# Patient Record
Sex: Female | Born: 1950 | Race: Black or African American | Hispanic: No | Smoking: Former smoker
Health system: Southern US, Community
[De-identification: ages and names within clinical notes are randomized; demographics above are authoritative.]

## PROBLEM LIST (undated history)

## (undated) DIAGNOSIS — N6320 Unspecified lump in the left breast, unspecified quadrant: Secondary | ICD-10-CM

## (undated) DIAGNOSIS — C801 Malignant (primary) neoplasm, unspecified: Secondary | ICD-10-CM

## (undated) DIAGNOSIS — N189 Chronic kidney disease, unspecified: Secondary | ICD-10-CM

## (undated) DIAGNOSIS — I10 Essential (primary) hypertension: Secondary | ICD-10-CM

## (undated) DIAGNOSIS — E785 Hyperlipidemia, unspecified: Secondary | ICD-10-CM

## (undated) DIAGNOSIS — M1990 Unspecified osteoarthritis, unspecified site: Secondary | ICD-10-CM

## (undated) HISTORY — DX: Essential (primary) hypertension: I10

## (undated) HISTORY — PX: BREAST EXCISIONAL BIOPSY: SUR124

## (undated) HISTORY — DX: Malignant (primary) neoplasm, unspecified: C80.1

## (undated) HISTORY — DX: Hyperlipidemia, unspecified: E78.5

---

## 1989-07-03 DIAGNOSIS — C801 Malignant (primary) neoplasm, unspecified: Secondary | ICD-10-CM

## 1989-07-03 HISTORY — PX: BREAST SURGERY: SHX581

## 1989-07-03 HISTORY — PX: BREAST LUMPECTOMY: SHX2

## 1989-07-03 HISTORY — DX: Malignant (primary) neoplasm, unspecified: C80.1

## 1998-01-26 ENCOUNTER — Other Ambulatory Visit: Admission: RE | Admit: 1998-01-26 | Discharge: 1998-01-26 | Payer: Self-pay

## 2001-10-08 ENCOUNTER — Ambulatory Visit (HOSPITAL_COMMUNITY): Admission: RE | Admit: 2001-10-08 | Discharge: 2001-10-08 | Payer: Self-pay | Admitting: Family Medicine

## 2001-10-08 ENCOUNTER — Encounter: Payer: Self-pay | Admitting: Family Medicine

## 2004-04-04 ENCOUNTER — Ambulatory Visit: Payer: Self-pay | Admitting: *Deleted

## 2004-05-11 ENCOUNTER — Ambulatory Visit: Payer: Self-pay | Admitting: Family Medicine

## 2004-05-18 ENCOUNTER — Ambulatory Visit (HOSPITAL_COMMUNITY): Admission: RE | Admit: 2004-05-18 | Discharge: 2004-05-18 | Payer: Self-pay | Admitting: Family Medicine

## 2004-05-31 ENCOUNTER — Ambulatory Visit: Payer: Self-pay | Admitting: Family Medicine

## 2004-08-02 ENCOUNTER — Ambulatory Visit: Payer: Self-pay | Admitting: Internal Medicine

## 2005-05-10 ENCOUNTER — Ambulatory Visit: Payer: Self-pay | Admitting: Family Medicine

## 2005-06-06 ENCOUNTER — Ambulatory Visit (HOSPITAL_COMMUNITY): Admission: RE | Admit: 2005-06-06 | Discharge: 2005-06-06 | Payer: Self-pay | Admitting: Family Medicine

## 2005-09-07 ENCOUNTER — Ambulatory Visit: Payer: Self-pay | Admitting: Family Medicine

## 2006-03-26 ENCOUNTER — Ambulatory Visit: Payer: Self-pay | Admitting: Family Medicine

## 2006-06-07 ENCOUNTER — Ambulatory Visit (HOSPITAL_COMMUNITY): Admission: RE | Admit: 2006-06-07 | Discharge: 2006-06-07 | Payer: Self-pay | Admitting: Family Medicine

## 2006-06-19 ENCOUNTER — Encounter (INDEPENDENT_AMBULATORY_CARE_PROVIDER_SITE_OTHER): Payer: Self-pay | Admitting: Family Medicine

## 2006-06-19 ENCOUNTER — Ambulatory Visit: Payer: Self-pay | Admitting: Family Medicine

## 2006-08-20 ENCOUNTER — Ambulatory Visit: Payer: Self-pay | Admitting: Family Medicine

## 2006-10-19 ENCOUNTER — Ambulatory Visit: Payer: Self-pay | Admitting: Family Medicine

## 2007-03-20 ENCOUNTER — Encounter (INDEPENDENT_AMBULATORY_CARE_PROVIDER_SITE_OTHER): Payer: Self-pay | Admitting: *Deleted

## 2007-07-29 ENCOUNTER — Ambulatory Visit: Payer: Self-pay | Admitting: Family Medicine

## 2007-07-30 ENCOUNTER — Ambulatory Visit (HOSPITAL_COMMUNITY): Admission: RE | Admit: 2007-07-30 | Discharge: 2007-07-30 | Payer: Self-pay | Admitting: Family Medicine

## 2007-08-20 ENCOUNTER — Ambulatory Visit: Payer: Self-pay | Admitting: Internal Medicine

## 2008-04-14 ENCOUNTER — Ambulatory Visit: Payer: Self-pay | Admitting: Family Medicine

## 2008-04-14 LAB — CONVERTED CEMR LAB
ALT: 30 units/L (ref 0–35)
AST: 22 units/L (ref 0–37)
Albumin: 4.6 g/dL (ref 3.5–5.2)
Alkaline Phosphatase: 81 units/L (ref 39–117)
BUN: 10 mg/dL (ref 6–23)
CO2: 25 meq/L (ref 19–32)
Calcium: 10.1 mg/dL (ref 8.4–10.5)
Chloride: 100 meq/L (ref 96–112)
Cholesterol: 171 mg/dL (ref 0–200)
Creatinine, Ser: 0.61 mg/dL (ref 0.40–1.20)
Glucose, Bld: 110 mg/dL — ABNORMAL HIGH (ref 70–99)
HDL: 66 mg/dL (ref >39)
LDL Cholesterol: 94 mg/dL (ref 0–99)
Microalb, Ur: 0.94 mg/dL (ref 0.00–1.89)
Potassium: 3.7 meq/L (ref 3.5–5.3)
Sodium: 141 meq/L (ref 135–145)
Total Bilirubin: 1.2 mg/dL (ref 0.3–1.2)
Total CHOL/HDL Ratio: 2.6
Total Protein: 7.9 g/dL (ref 6.0–8.3)
Triglycerides: 57 mg/dL (ref <150)
VLDL: 11 mg/dL (ref 0–40)
Vit D, 1,25-Dihydroxy: 14 — ABNORMAL LOW (ref 30–89)

## 2008-05-19 ENCOUNTER — Ambulatory Visit: Payer: Self-pay | Admitting: Internal Medicine

## 2009-02-12 ENCOUNTER — Ambulatory Visit: Payer: Self-pay | Admitting: Family Medicine

## 2009-02-12 LAB — CONVERTED CEMR LAB
AST: 20 units/L (ref 0–37)
Albumin: 4.3 g/dL (ref 3.5–5.2)
Alkaline Phosphatase: 70 units/L (ref 39–117)
Basophils Absolute: 0 10*3/uL (ref 0.0–0.1)
Basophils Relative: 0 % (ref 0–1)
Glucose, Bld: 91 mg/dL (ref 70–99)
Hemoglobin: 12.5 g/dL (ref 12.0–15.0)
Lymphocytes Relative: 55 % — ABNORMAL HIGH (ref 12–46)
MCHC: 32 g/dL (ref 30.0–36.0)
Neutro Abs: 2.1 10*3/uL (ref 1.7–7.7)
Neutrophils Relative %: 38 % — ABNORMAL LOW (ref 43–77)
Platelets: 267 10*3/uL (ref 150–400)
Potassium: 4.2 meq/L (ref 3.5–5.3)
RDW: 14.9 % (ref 11.5–15.5)
Sodium: 143 meq/L (ref 135–145)
Total Bilirubin: 1.2 mg/dL (ref 0.3–1.2)
Total Protein: 7 g/dL (ref 6.0–8.3)
Vit D, 25-Hydroxy: 30 ng/mL (ref 30–89)

## 2010-09-22 ENCOUNTER — Encounter (INDEPENDENT_AMBULATORY_CARE_PROVIDER_SITE_OTHER): Payer: Self-pay | Admitting: Family Medicine

## 2010-09-22 ENCOUNTER — Other Ambulatory Visit (HOSPITAL_COMMUNITY): Payer: Self-pay | Admitting: Family Medicine

## 2010-09-22 ENCOUNTER — Other Ambulatory Visit: Payer: Self-pay | Admitting: Family Medicine

## 2010-09-22 DIAGNOSIS — R102 Pelvic and perineal pain: Secondary | ICD-10-CM

## 2010-09-22 DIAGNOSIS — N950 Postmenopausal bleeding: Secondary | ICD-10-CM

## 2010-09-22 DIAGNOSIS — Z1231 Encounter for screening mammogram for malignant neoplasm of breast: Secondary | ICD-10-CM

## 2010-09-22 LAB — CONVERTED CEMR LAB
ALT: 27 units/L (ref 0–35)
AST: 27 units/L (ref 0–37)
Alkaline Phosphatase: 91 units/L (ref 39–117)
BUN: 11 mg/dL (ref 6–23)
Basophils Absolute: 0 10*3/uL (ref 0.0–0.1)
Basophils Relative: 0 % (ref 0–1)
Creatinine, Ser: 0.66 mg/dL (ref 0.40–1.20)
Eosinophils Absolute: 0.1 10*3/uL (ref 0.0–0.7)
HDL: 64 mg/dL (ref >39)
Hemoglobin: 14.2 g/dL (ref 12.0–15.0)
LDL Cholesterol: 85 mg/dL (ref 0–99)
MCHC: 33.1 g/dL (ref 30.0–36.0)
MCV: 87.6 fL (ref 78.0–100.0)
Microalb, Ur: 1.56 mg/dL (ref 0.00–1.89)
Monocytes Absolute: 0.4 10*3/uL (ref 0.1–1.0)
Monocytes Relative: 5 % (ref 3–12)
Neutro Abs: 3.4 10*3/uL (ref 1.7–7.7)
Neutrophils Relative %: 48 % (ref 43–77)
RBC: 4.9 M/uL (ref 3.87–5.11)
RDW: 13.7 % (ref 11.5–15.5)
TSH: 1.384 microintl units/mL (ref 0.350–4.500)
Total CHOL/HDL Ratio: 2.5
VLDL: 11 mg/dL (ref 0–40)

## 2010-09-30 ENCOUNTER — Ambulatory Visit (HOSPITAL_COMMUNITY)
Admission: RE | Admit: 2010-09-30 | Discharge: 2010-09-30 | Disposition: A | Discharge status: Home / Self Care | Payer: Self-pay | Source: Ambulatory Visit | Attending: Family Medicine | Admitting: Family Medicine

## 2010-09-30 ENCOUNTER — Ambulatory Visit (HOSPITAL_COMMUNITY): Payer: Self-pay

## 2010-09-30 DIAGNOSIS — N950 Postmenopausal bleeding: Secondary | ICD-10-CM

## 2010-09-30 DIAGNOSIS — R102 Pelvic and perineal pain: Secondary | ICD-10-CM

## 2010-09-30 DIAGNOSIS — Z1231 Encounter for screening mammogram for malignant neoplasm of breast: Secondary | ICD-10-CM | POA: Insufficient documentation

## 2011-12-18 ENCOUNTER — Encounter: Payer: Self-pay | Admitting: Family Medicine

## 2011-12-18 ENCOUNTER — Ambulatory Visit (INDEPENDENT_AMBULATORY_CARE_PROVIDER_SITE_OTHER): Payer: Self-pay | Admitting: Family Medicine

## 2011-12-18 VITALS — BP 158/104 | HR 94 | Ht 60.0 in | Wt 172.0 lb

## 2011-12-18 DIAGNOSIS — M25579 Pain in unspecified ankle and joints of unspecified foot: Secondary | ICD-10-CM

## 2011-12-18 DIAGNOSIS — E119 Type 2 diabetes mellitus without complications: Secondary | ICD-10-CM

## 2011-12-18 DIAGNOSIS — I10 Essential (primary) hypertension: Secondary | ICD-10-CM

## 2011-12-18 DIAGNOSIS — M25571 Pain in right ankle and joints of right foot: Secondary | ICD-10-CM

## 2011-12-18 MED ORDER — CLONIDINE HCL 0.1 MG PO TABS
0.2000 mg | ORAL_TABLET | Freq: Once | ORAL | Status: AC
  Administered 2011-12-18: 0.2 mg via ORAL

## 2011-12-18 MED ORDER — METFORMIN HCL 1000 MG PO TABS: ORAL_TABLET | ORAL | Status: DC

## 2011-12-18 MED ORDER — LISINOPRIL 40 MG PO TABS: 40.0000 mg | ORAL_TABLET | Freq: Every day | ORAL | Status: DC

## 2011-12-18 MED ORDER — HYDROCHLOROTHIAZIDE 25 MG PO TABS: 25.0000 mg | ORAL_TABLET | Freq: Every day | ORAL | Status: DC

## 2011-12-18 MED ORDER — METOPROLOL TARTRATE 25 MG PO TABS: 25.0000 mg | ORAL_TABLET | Freq: Two times a day (BID) | ORAL | Status: DC

## 2011-12-18 MED ORDER — METFORMIN HCL 500 MG PO TABS: ORAL_TABLET | ORAL | Status: DC

## 2011-12-18 NOTE — Patient Instructions (Signed)
It was nice to meet you.  I want to make sure we get your blood pressure and your diabetes under control.  Please make a Nurse appointment for Thursday or Friday to check your blood sugar.    When you get the [facility], please call the office to make an appointment with me (hopefully 3-4 weeks) so we can get blood work.

## 2011-12-19 DIAGNOSIS — I10 Essential (primary) hypertension: Secondary | ICD-10-CM | POA: Insufficient documentation

## 2011-12-19 DIAGNOSIS — E119 Type 2 diabetes mellitus without complications: Secondary | ICD-10-CM | POA: Insufficient documentation

## 2011-12-19 DIAGNOSIS — M25572 Pain in left ankle and joints of left foot: Secondary | ICD-10-CM | POA: Insufficient documentation

## 2011-12-19 NOTE — Progress Notes (Signed)
  Subjective:    Patient ID: Ashley Sloan, female    DOB: 1950/10/14, 61 y.o.   MRN: [id number]  HPI  Ashley Sloan presents to establish care.    HTN- Was taking HCTZ, Amlodpine, and Lisinopril, but ran out a few months ago. She says she has had HTN for around 30 years.  She denies any current dyspnea, LE swelling, chest pain or palpitations.  She denies vision changes, but does admit to some headaches since she ran out of her medications.   DM- patient says she was taking Actos, and her diabetes was well controlled, but says she has not been checking her blood sugars since she ran out of medications.  She denies polyuria/polydipsia.   Right Foot/Ankle pain- has bothered her for many months, but she thinks it has been worse for about three weeks.  It used to hurt like a knife stabbing the medial side of her ankle when she was walking, but now she also has a dull achy pain all the time. No injury.    Family History  Problem Relation Age of Onset  . Diabetes Mother   . Hypertension Mother   . COPD Mother   . Hypertension Father   . Heart disease Sister   . Hypertension Brother   . Diabetes Daughter    Past Medical History  Diagnosis Date  . Hypertension   . Diabetes mellitus   . Hyperlipidemia   . 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 Smoking status: Former Smoker -- 0.5 packs/day for 25 years    Quit date: 12/18/1982  . Smokeless tobacco: Not on file  . Alcohol Use: No   Review of Systems Pertinent items in HPI.     Objective:   Physical Exam Initial BP: 201/133 BP 158/104  Pulse 94  Ht 5' (1.524 m)  Wt 172 lb (78.019 kg)  BMI 33.59 kg/m2 General appearance: alert, cooperative and no distress Neck: no adenopathy, no carotid bruit, no JVD, supple, symmetrical, trachea midline and thyroid not enlarged, symmetric, no tenderness/mass/nodules Lungs: clear to auscultation bilaterally Heart: regular rate and rhythm, S1, S2 normal, no murmur, click, rub or  gallop Abdomen: soft, non-tender; bowel sounds normal; no masses,  no organomegaly Extremities: extremities normal, atraumatic, no cyanosis or edema  Right Ankle: No visible erythema or swelling. Range of motion is full in all directions. Strength is 5/5 in all directions.  Patient has pain with inversion resistance.  Tenderness to palpation behind medial malleolus.  Stable lateral and medial ligaments; squeeze test and kleiger test unremarkable; No pain at base of 5th MT; No tenderness over cuboid; No tenderness on posterior aspects of lateral and medial malleolus No sign of peroneal tendon subluxations; Able to walk 4 steps.        Assessment & Plan:

## 2011-12-19 NOTE — Assessment & Plan Note (Signed)
Suspect tendonitis- advised ice, ROM exercises, topical aspercreme, and NSAIDS if needed.  Will re-address if it does not improve.

## 2011-12-19 NOTE — Assessment & Plan Note (Signed)
Patient off medications, initial BP 201/133.  Patient given .2mg  of Clonidine, with improvement in BP.  Discussed finances, patient is planning to get [facility] card, but would appreciate all her medications be on $4 list.  Will re-start HCTZ, Lisinopril, and d/c Amlodpine (generic but not $4), and add Metoprolol.  Discussed importance of taking these medications every day, starting today.  She voices understanding.

## 2011-12-19 NOTE — Assessment & Plan Note (Signed)
Off medications, but unsure about control currently.  No overt signs of hyperglycemia.  Patient previously on Actos, which is not on the $4 plan, will Start metformin at 500 po bid, increase to 1000 bid.  I have asked her to come back in 3-4 weeks when she has the [facility] card so we can get labs.

## 2011-12-22 ENCOUNTER — Ambulatory Visit (INDEPENDENT_AMBULATORY_CARE_PROVIDER_SITE_OTHER): Payer: Self-pay | Admitting: *Deleted

## 2011-12-22 VITALS — BP 180/100

## 2011-12-22 DIAGNOSIS — E119 Type 2 diabetes mellitus without complications: Secondary | ICD-10-CM

## 2011-12-22 DIAGNOSIS — I10 Essential (primary) hypertension: Secondary | ICD-10-CM

## 2011-12-22 MED ORDER — VERAPAMIL HCL 80 MG PO TABS: 80.0000 mg | ORAL_TABLET | Freq: Three times a day (TID) | ORAL | Status: DC

## 2011-12-22 NOTE — Progress Notes (Signed)
Patient in for BP check and BP check. FBS 14 hours PP 107.  She is taking metformin as directed.  Started with one whole tablet today.  BP  RA 180/100 and LA 190/100 pulse 56.  Medications reviewed and she is taking as directed.   Paged Dr. Lula Olszewski and she advises that she will send in a new medication for patient to take in addition to her current meds.  Patient has follow up on 07/08.

## 2012-01-08 ENCOUNTER — Ambulatory Visit (INDEPENDENT_AMBULATORY_CARE_PROVIDER_SITE_OTHER): Payer: Self-pay | Admitting: Family Medicine

## 2012-01-08 ENCOUNTER — Encounter: Payer: Self-pay | Admitting: Family Medicine

## 2012-01-08 VITALS — BP 172/90 | HR 76 | Temp 98.5°F | Ht 60.0 in | Wt 173.2 lb

## 2012-01-08 DIAGNOSIS — M25579 Pain in unspecified ankle and joints of unspecified foot: Secondary | ICD-10-CM

## 2012-01-08 DIAGNOSIS — E119 Type 2 diabetes mellitus without complications: Secondary | ICD-10-CM

## 2012-01-08 DIAGNOSIS — I10 Essential (primary) hypertension: Secondary | ICD-10-CM

## 2012-01-08 DIAGNOSIS — M25572 Pain in left ankle and joints of left foot: Secondary | ICD-10-CM

## 2012-01-08 LAB — POCT GLYCOSYLATED HEMOGLOBIN (HGB A1C): Hemoglobin A1C: 7

## 2012-01-08 MED ORDER — METOPROLOL TARTRATE 50 MG PO TABS: 50.0000 mg | ORAL_TABLET | Freq: Two times a day (BID) | ORAL | Status: DC

## 2012-01-08 MED ORDER — VERAPAMIL HCL 80 MG PO TABS: 80.0000 mg | ORAL_TABLET | Freq: Three times a day (TID) | ORAL | Status: DC

## 2012-01-08 MED ORDER — METOPROLOL TARTRATE 50 MG PO TABS: 25.0000 mg | ORAL_TABLET | Freq: Two times a day (BID) | ORAL | Status: DC

## 2012-01-08 NOTE — Assessment & Plan Note (Signed)
Exam still consistent with tendonitis.  Advised ASO lace up ankle brace for work, ice after work, continue topical Aspercreme, tylenol ibuprofen PRN.

## 2012-01-08 NOTE — Assessment & Plan Note (Signed)
Patient taking metformin 1000 mg po daily- A1C = 7.0.  Will continue current dose.

## 2012-01-08 NOTE — Progress Notes (Signed)
  Subjective:    Patient ID: Ashley Sloan, female    DOB: Mar 28, 1951, 61 y.o.   MRN: [id number]  HPI  Ms. Haffey comes in for follow up.  BP- she is taking HCTZ, Lisinopril, Metoprolol as prescribed.  She has not gotten Verapamil yet- says the pharmacy says they did not have it.  She denies any chest pain, dyspnea, LE swelling, headaches.   DM- taking metfomrin 1000 mg po daily.  Not checking sugars at home.  Some GI upset with metformin, but it is tolerable.  She denies hyper or hypoglycemic episodes that she knows of.   Foot pain- left foot and ankle continue to hurt.  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  She iced it some and took some ibuprofen, which helped a little.  She denies any injury, pain is sharp shooting pain on medial side of left ankle.  She says sometimes it swells.   Review of Systems Pertinent items in HPI.     Objective:   Physical Exam BP 172/90  Pulse 76  Temp 98.5 F (36.9 C) (Oral)  Ht 5' (1.524 m)  Wt 173 lb 3.2 oz (78.563 kg)  BMI 33.83 kg/m2 General appearance: alert, cooperative and no distress Eyes: conjunctivae/corneas clear. PERRL, EOM's intact. Fundi benign. Neck: no adenopathy, no JVD, supple, symmetrical, trachea midline and thyroid not enlarged, symmetric, no tenderness/mass/nodules Lungs: clear to auscultation bilaterally Heart: regular rate and rhythm, S1, S2 normal, no murmur, click, rub or gallop Left Ankle: +TTP posterior to medial malleolus, pain in that area with dorsiflexion and plantar flexion, but no weakness.  No swelling or deformity.        Assessment & Plan:

## 2012-01-08 NOTE — Patient Instructions (Signed)
It was good to see you.  Your blood pressure today was BP: 172/90 mmHg.  Remember your goal blood pressure is about 120/80.  Please be sure to take your medication every day.   - Continue to take Hydrochlorothiazide and Lisinopril every day.  - I have increased your Metoprolol from 25 mg twice a day to 50 mg twice a day.  I sent a new prescription to [facility], but you can take two pills in the morning and two pills at night until you run out so you don't waste any.  - I have also called in Verapamil, I want you to take one pill three times a day.    Please make a nurse visit in one week to have your blood pressure checked again.   Keep taking your metformin once a day for diabetes.    For your ankle, please wear the ankle brace at work, ice your ankle after work, and use tylenol and ibuprofen as needed for pain.

## 2012-01-08 NOTE — Assessment & Plan Note (Signed)
Still elevated.  Continue HCTZ and Lisinopril.  Increase metoprolol to 50 po bid, and add verapamil 80 TID.  Patient instructed to come in for nurse visit in one week for BP check, f/u with me in 3 months or PRN.

## 2012-12-12 ENCOUNTER — Ambulatory Visit (INDEPENDENT_AMBULATORY_CARE_PROVIDER_SITE_OTHER): Payer: Self-pay | Admitting: Family Medicine

## 2012-12-12 ENCOUNTER — Encounter: Payer: Self-pay | Admitting: Family Medicine

## 2012-12-12 VITALS — BP 184/93 | HR 72 | Temp 98.1°F | Ht 60.0 in | Wt 171.0 lb

## 2012-12-12 DIAGNOSIS — E119 Type 2 diabetes mellitus without complications: Secondary | ICD-10-CM

## 2012-12-12 DIAGNOSIS — M25519 Pain in unspecified shoulder: Secondary | ICD-10-CM

## 2012-12-12 DIAGNOSIS — M25511 Pain in right shoulder: Secondary | ICD-10-CM | POA: Insufficient documentation

## 2012-12-12 DIAGNOSIS — I10 Essential (primary) hypertension: Secondary | ICD-10-CM

## 2012-12-12 LAB — LIPID PANEL
Cholesterol: 203 mg/dL — ABNORMAL HIGH (ref 0–200)
Total CHOL/HDL Ratio: 3.4 Ratio
Triglycerides: 70 mg/dL (ref <150)
VLDL: 14 mg/dL (ref 0–40)

## 2012-12-12 LAB — CBC
Hemoglobin: 13 g/dL (ref 12.0–15.0)
MCHC: 33.6 g/dL (ref 30.0–36.0)
RDW: 14.2 % (ref 11.5–15.5)

## 2012-12-12 LAB — BASIC METABOLIC PANEL
Glucose, Bld: 116 mg/dL — ABNORMAL HIGH (ref 70–99)
Potassium: 4 mEq/L (ref 3.5–5.3)
Sodium: 141 mEq/L (ref 135–145)

## 2012-12-12 LAB — POCT GLYCOSYLATED HEMOGLOBIN (HGB A1C): Hemoglobin A1C: 6.8

## 2012-12-12 MED ORDER — METOPROLOL TARTRATE 50 MG PO TABS: 50.0000 mg | ORAL_TABLET | Freq: Two times a day (BID) | ORAL | Status: DC

## 2012-12-12 MED ORDER — METFORMIN HCL 1000 MG PO TABS: ORAL_TABLET | ORAL | Status: DC

## 2012-12-12 MED ORDER — LISINOPRIL-HYDROCHLOROTHIAZIDE 20-12.5 MG PO TABS: 2.0000 | ORAL_TABLET | Freq: Every day | ORAL | Status: DC

## 2012-12-12 MED ORDER — VERAPAMIL HCL 80 MG PO TABS: 80.0000 mg | ORAL_TABLET | Freq: Three times a day (TID) | ORAL | Status: DC

## 2012-12-12 NOTE — Assessment & Plan Note (Signed)
Pain could be secondary to rotator cuff injury or tendonitis. Will give shoulder exercises to work on ROM. Continue ibuprofen daily for inflammation. If does not improve, will consider referral to [facility] for ultrasound.

## 2012-12-12 NOTE — Assessment & Plan Note (Signed)
BP above goal. On Lisinopril 40mg , HCTZ 25 mg and Metoprolol 50mg  BID. Will add Verapamil 80mg  TID to regimen which is on $4 list. Also, combined Lisinopril and HCTZ into one pill to reduce cost. F/u in 1 month, or sooner if she is unable to tolerate new medication.

## 2012-12-12 NOTE — Patient Instructions (Addendum)
It was nice to meet you.  I have refilled your medications. I have added back Verapamil 80mg  TID. It is on the $4 list at [facility].  Please come back in one month to check on your blood pressure.  Janese Radabaugh M. Kiondre Grenz, M.D.  Shoulder Range of Motion Exercises The shoulder is the most flexible joint in the human body. Because of this it is also the most unstable joint in the body. All ages can develop shoulder problems. Early treatment of problems is necessary for a good outcome. People react to shoulder pain by decreasing the movement of the joint. After a brief period of time, the shoulder can become "frozen". This is an almost complete loss of the ability to move the damaged shoulder. Following injuries your caregivers can give you instructions on exercises to keep your range of motion (ability to move your shoulder freely), or regain it if it has been lost.  EXERCISES EXERCISES TO MAINTAIN THE MOBILITY OF YOUR SHOULDER: Codman's Exercise or Pendulum Exercise  This exercise may be performed in a prone (face-down) lying position or standing while leaning on a chair with the opposite arm. Its purpose is to relax the muscles in your shoulder and slowly but surely increase the range of motion and to relieve pain.  Lie on your stomach close to the side edge of the bed. Let your weak arm hang over the edge of the bed. Relax your shoulder, arm and hand. Let your shoulder blade relax and drop down.  Slowly and gently swing your arm forward and back. Do not use your neck muscles; relax them. It might be easier to have someone else gently start swinging your arm.  As pain decreases, increase your swing. To start, arm swing should begin at 15 degree angles. In time and as pain lessens, move to 30-45 degree angles. Start with swinging for about 15 seconds, and work towards swinging for 3 to 5 minutes.  This exercise may also be performed in a standing/bent over position.  Stand and hold onto a sturdy chair  with your good arm. Bend forward at the waist and bend your knees slightly to help protect your back. Relax your weak arm, let it hang limp. Relax your shoulder blade and let it drop.  Keep your shoulder relaxed and use body motion to swing your arm in small circles.  Stand up tall and relax.  Repeat motion and change direction of circles.  Start with swinging for about 30 seconds, and work towards swinging for 3 to 5 minutes. STRETCHING EXERCISES:  Lift your arm out in front of you with the elbow bent at 90 degrees. Using your other arm gently pull the elbow forward and across your body.  Bend one arm behind you with the palm facing outward. Using the other arm, hold a towel or rope and reach this arm up above your head, then bend it at the elbow to move your wrist to behind your neck. Grab the free end of the towel with the hand behind your back. Gently pull the towel up with the hand behind your neck, gradually increasing the pull on the hand behind the small of your back. Then, gradually pull down with the hand behind the small of your back. This will pull the hand and arm behind your neck further. Both shoulders will have an increased range of motion with repetition of this exercise. STRENGTHENING EXERCISES:  Standing with your arm at your side and straight out from your shoulder with  the elbow bent at 90 degrees, hold onto a small weight and slowly raise your hand so it points straight up in the air. Repeat this five times to begin with, and gradually increase to ten times. Do this four times per day. As you grow stronger you can gradually increase the weight.  Repeat the above exercise, only this time using an elastic band. Start with your hand up in the air and pull down until your hand is by your side. As you grow stronger, gradually increase the amount you pull by increasing the number or size of the elastic bands. Use the same amount of repetitions.  Standing with your hand at your  side and holding onto a weight, gradually lift the hand in front of you until it is over your head. Do the same also with the hand remaining at your side and lift the hand away from your body until it is again over your head. Repeat this five times to begin with, and gradually increase to ten times. Do this four times per day. As you grow stronger you can gradually increase the weight. Document Released: 03/18/2003 Document Revised: 09/11/2011 Document Reviewed: 06/19/2005 [facility] Patient Information 2014 [place], [place].

## 2012-12-12 NOTE — Assessment & Plan Note (Signed)
Metformin refilled. Foot exam performed today. Will check A1C, CBC and Bmet today. Retinal scan today. F/u 3 months.

## 2012-12-12 NOTE — Progress Notes (Signed)
Patient ID: NARE GASPARI, female   DOB: April 12, 1951, 63 y.o.   MRN: [id number]  [facility] Amber M. Hairford, MD Phone: [phone]   Subjective: HPI: Patient is a 62 y.o. female presenting to clinic today for follow up appointment. Concerns today include HTN and DM. Patient would like to talk about right shoulder pain  1. Hypertension Blood pressure at home: Checks with wrist cuff, normally high Blood pressure today: 184/93 Taking Meds: Lisinopril, HCTZ, Metoprolol. Not missed any doses, but needs refills Side effects: None ROS: Denies headache, visual changes, nausea, vomiting, chest pain, abdominal pain or shortness of breath.  2. Diabetes:  Does not monitor at home. No symptomatic lows. Taking medications: Metformin Side effects: None ROS: denies chills, dizziness, LOC, polyuria, polydipsia, numbness or tingling in extremities or chest pain. Last eye exam: More than 5 years, will do retinal scan in clinic today Last foot exam: 3 years ago Nephropathy screen indicated?: On Lisinopril Last flu, zoster and/or pneumovax: 2013, no shingles vaccine, pneumovax within last 5 years at [facility] but patient does not remember whenshe got it.  3. Shoulder pain Reports pain in right shoulder. Worse if she is reaching back like wiping herself on the toilet. Does not hurt to reach up or forward. Taking ibuprofen which helps some. Was told in the past that she had bursitis. No injury to shoulder. No swelling, redness of joint or fevers.  History Reviewed: Former smoker. Health Maintenance: Needs mammogram, but does not have insurance  ROS: Please see HPI above.  Objective: Office vital signs reviewed. BP 184/93  Pulse 72  Temp(Src) 98.1 F (36.7 C) (Oral)  Ht 5' (1.524 m)  Wt 171 lb (77.565 kg)  BMI 33.4 kg/m2  Physical Examination:  General: Awake, alert. NAD. Very pleasant HEENT: Atraumatic, normocephalic. MMM Neck: No masses palpated. No LAD Pulm:  CTAB, no wheezes Cardio: RRR, no murmurs appreciated Abdomen:+BS, soft, nontender, nondistended Extremities: Trace lower extremity edema.   Right shoulder: Good ROM reaching forward and upward. Decreased reach backwards. Some signs of impingement on hawkins. No TTP of joint. No obvious edema. Neuro: Grossly intact  Assessment: 62 y.o. female follow up appointment  Plan: See Problem List and After Visit Summary

## 2012-12-17 ENCOUNTER — Encounter: Payer: Self-pay | Admitting: Family Medicine

## 2012-12-17 ENCOUNTER — Telehealth: Payer: Self-pay | Admitting: Family Medicine

## 2012-12-17 MED ORDER — SIMVASTATIN 40 MG PO TABS: 40.0000 mg | ORAL_TABLET | Freq: Every day | ORAL | Status: DC

## 2012-12-17 NOTE — Telephone Encounter (Signed)
Called patient again and found out she will not be home from work until 7pm.  Left message for her to call her doctor's office.  Kinnick Maus, Darlyne Russian, CMA

## 2012-12-17 NOTE — Telephone Encounter (Signed)
LM for patient to return call to her doctors office.  Ewen Varnell, Darlyne Russian, CMA

## 2012-12-17 NOTE — Telephone Encounter (Signed)
Please let patient know that she will need to start a cholesterol medication.   Her bad cholesterol was 130 on her last labs, and we would like for it to be less than 100. Given her diabetes and high blood pressure, this puts her at a higher risk for heart disease so I would like to lower her cholesterol some.  We will start with a [facility] $4 medication, Simvastatin, that I will call in for her. We will recheck her lipids in 6 months to see if it has improved. I will also send her a copy of her lab report for her records.  Thank you! Ashley Sloan, M.D.

## 2012-12-19 ENCOUNTER — Telehealth: Payer: Self-pay | Admitting: Family Medicine

## 2012-12-19 NOTE — Telephone Encounter (Signed)
Reached patient on phone, someone else had already spoken to patient and given her message, see MD note below.  Ashley Sloan, Darlyne Russian, CMA

## 2012-12-19 NOTE — Telephone Encounter (Signed)
Will fwd to Md.  Atasha Colebank L, CMA  

## 2012-12-19 NOTE — Telephone Encounter (Signed)
Patient was told the simvastatin was on the $4 list at [facility] but it was actually $53. Patient would like to switch to a medicine that is on the $4 list. Pls call patient once script has been sent to [facility]

## 2012-12-20 NOTE — Telephone Encounter (Signed)
Pt would like prescription sent to [facility],  [facility] into that pharmacy.  Sonam Huelsmann, Darlyne Russian, CMA

## 2012-12-20 NOTE — Telephone Encounter (Signed)
Please notify:  $5 at [facility] $10 at [facility] and [employer]

## 2013-01-07 ENCOUNTER — Telehealth: Payer: Self-pay | Admitting: *Deleted

## 2013-01-07 NOTE — Telephone Encounter (Signed)
Patient calling due to decreased BP & appetite since BP med was increased last month.  BP readings at work---99/70 & 100/?Marland Kitchen  Took BP meds this morning and "was not feeling right."  Patient had to leave work.  Just checked BP before calling & 121/75.  Last office note states patient needs to return in 1 month for BP check.  Will check with MD & see if appt needs to be with MD or only nurse visit.  Gaylene Brooks, RN

## 2013-01-17 ENCOUNTER — Ambulatory Visit (INDEPENDENT_AMBULATORY_CARE_PROVIDER_SITE_OTHER): Payer: Self-pay | Admitting: *Deleted

## 2013-01-17 VITALS — BP 178/86 | HR 66 | Resp 20

## 2013-01-17 DIAGNOSIS — I10 Essential (primary) hypertension: Secondary | ICD-10-CM

## 2013-01-17 MED ORDER — VERAPAMIL HCL 80 MG PO TABS: 40.0000 mg | ORAL_TABLET | Freq: Three times a day (TID) | ORAL | Status: DC

## 2013-01-17 NOTE — Progress Notes (Signed)
Patient ID: Ashley Sloan, female   DOB: 1950-10-01, 62 y.o.   MRN: [id number]  Please have her cut her Verapamil in half, so that she takes 40mg  TID. Then please have follow-up with you in two weeks to reassess BP and side-effects.

## 2013-01-17 NOTE — Progress Notes (Signed)
Pt states that verapamil is making her nauseated and that she has not been taking it and once while taking her BP was under 100 systolic. Encouraged drinking lots of water, low salt diet, avoiding pork and sodas. Pt verbalized understanding. INformed pt that I would call with any further instructions regarding meds. Wyatt Haste, RN-BSN

## 2013-08-18 ENCOUNTER — Other Ambulatory Visit: Payer: Self-pay | Admitting: Family Medicine

## 2013-08-19 NOTE — Telephone Encounter (Signed)
Please have her schedule an appointment in the the next 3 months; I've refilled her BP and DM meds until then.

## 2013-08-20 NOTE — Telephone Encounter (Signed)
Pt is aware of refills and that she will need to schedule an appt. Tou Hayner,CMA

## 2013-10-30 ENCOUNTER — Ambulatory Visit (INDEPENDENT_AMBULATORY_CARE_PROVIDER_SITE_OTHER): Payer: Medicare Other | Admitting: Family Medicine

## 2013-10-30 ENCOUNTER — Encounter: Payer: Self-pay | Admitting: Family Medicine

## 2013-10-30 VITALS — BP 158/90 | HR 91 | Temp 98.1°F | Ht 60.0 in | Wt 178.0 lb

## 2013-10-30 DIAGNOSIS — I10 Essential (primary) hypertension: Secondary | ICD-10-CM

## 2013-10-30 DIAGNOSIS — M21969 Unspecified acquired deformity of unspecified lower leg: Secondary | ICD-10-CM | POA: Insufficient documentation

## 2013-10-30 DIAGNOSIS — E119 Type 2 diabetes mellitus without complications: Secondary | ICD-10-CM

## 2013-10-30 DIAGNOSIS — M25579 Pain in unspecified ankle and joints of unspecified foot: Secondary | ICD-10-CM

## 2013-10-30 LAB — POCT GLYCOSYLATED HEMOGLOBIN (HGB A1C): HEMOGLOBIN A1C: 7.1

## 2013-10-30 LAB — CBC
HEMATOCRIT: 36.7 % (ref 36.0–46.0)
Hemoglobin: 12.6 g/dL (ref 12.0–15.0)
MCH: 29 pg (ref 26.0–34.0)
MCHC: 34.3 g/dL (ref 30.0–36.0)
MCV: 84.6 fL (ref 78.0–100.0)
PLATELETS: 254 10*3/uL (ref 150–400)
RBC: 4.34 MIL/uL (ref 3.87–5.11)
RDW: 14.1 % (ref 11.5–15.5)
WBC: 7.6 10*3/uL (ref 4.0–10.5)

## 2013-10-30 MED ORDER — MELOXICAM 7.5 MG PO TABS: 7.5000 mg | ORAL_TABLET | Freq: Every day | ORAL | Status: DC

## 2013-10-30 NOTE — Assessment & Plan Note (Signed)
BP slightly elevated initially; on recheck 150/90 - Discussed cutting back on salt (which she adds to foods) - Will have her return for nurse BP check in 1 month - She previously discontinued Verapamil herself after hypotensive episodes at work and dizziness

## 2013-10-30 NOTE — Progress Notes (Signed)
  Patient name: Ashley Sloan MRN [id number]  Date of birth: March 18, 1951  CC & HPI:  Ashley Sloan is a 63 y.o. female presenting today for HTN, DM and Foot pain.   Foot Pain  Left > Right achy pain on top of feet gradually getting worse. No trauma, No numbness or pins and needles sensation. Gets worse as the day goes on, and she on her feet. Taking occassional ibuprofen without relief.   CHRONIC HYPERTENSION  BP Readings from Last 3 Encounters:  10/30/13 158/90  01/17/13 178/86  12/12/12 184/93    Control: BP on recheck 150/90 Disease Monitoring  Blood pressure range outside clinc: not checking  Chest pain: no   Dyspnea: no   Claudication: no  Medication compliance/financial difficulties: no, Stopped taking Verapamil due to hypotensive episodes and dizziness   Medication Side Effects: None since stopping Verapamil  Preventitive Healthcare:   History  Smoking status  . Former Smoker -- 0.50 packs/day for 25 years  . Quit date: 12/18/1982  Smokeless tobacco  . Not on file   Salt Restriction < 1500 mg daily: Not following currently; discussed cutting back on salt (which she adds to foods)   DIABETES   Blood Sugar Ranges: Not current checking  Symptoms of Hypoglycemia? no  Comorbid Symptoms: no Chest pain; no SOB; no Neuropathy: no Vision problems  Medication Compliance: yes   Medication Side Effects: None  ROS: See HPI above otherwise negative.  Medical & Surgical Hx:  Reviewed.  Medications & Allergies: Reviewed & Updated - see associated section Social History: Reviewed:   Objective Findings:  Vitals: BP 158/90  Pulse 91  Temp(Src) 98.1 F (36.7 C) (Oral)  Ht 5' (1.524 m)  Wt 178 lb (80.74 kg)  BMI 34.76 kg/m2  Gen: NAD CV: RRR w/o m/r/g, pulses +2 b/l Resp: CTAB w/ normal respiratory effort Feet: Point tenderness on mid plantar surface of her feet L>R. No swelling or erythema. Pulses 2+; FROM sensation intact.   Assessment & Plan:   Please See  Problem Focused Assessment & Plan

## 2013-10-30 NOTE — Patient Instructions (Signed)
It was great seeing you today.   1. Your diabetes is well controlled today! 2. Your blood pressure is a little high, and this is most likely due to the salt in your diet. Try to limit the salt in your diet for the next month, and then return to clinic for a blood pressure check.  3. Your Foot pain is caused by your shoes being too tight. Loosen your shoes and take Mobic every morning for the next month. You should also ice your foot after work for the next week.    Please bring all your medications to every doctors visit  Sign up for My Chart to have easy access to your labs results, and communication with your Primary care physician.  Next Appointment  Please call to make an appointment for nurse blood pressure check in 1 month.   I look forward to talking with you again at our next visit. If you have any questions or concerns before then, please call the clinic at [phone].  Take Care,   Dr Phill Myron

## 2013-10-30 NOTE — Assessment & Plan Note (Signed)
Point tenderness on plantar surface L>R; Likely due to lacing shoes too tight, secondary exacerbated by mild foot swelling with prolonged standing/working. - patient education - Ice  - Mobic

## 2013-10-30 NOTE — Assessment & Plan Note (Signed)
Well controlled - continue current treatment - Not currently checking blood sugars; No hypoglycemic symptoms

## 2013-10-31 LAB — COMPREHENSIVE METABOLIC PANEL
ALBUMIN: 4.2 g/dL (ref 3.5–5.2)
ALT: 26 U/L (ref 0–35)
AST: 26 U/L (ref 0–37)
Alkaline Phosphatase: 67 U/L (ref 39–117)
BUN: 11 mg/dL (ref 6–23)
CALCIUM: 10.1 mg/dL (ref 8.4–10.5)
CHLORIDE: 98 meq/L (ref 96–112)
CO2: 27 meq/L (ref 19–32)
Creat: 0.76 mg/dL (ref 0.50–1.10)
Glucose, Bld: 157 mg/dL — ABNORMAL HIGH (ref 70–99)
POTASSIUM: 3.5 meq/L (ref 3.5–5.3)
SODIUM: 139 meq/L (ref 135–145)
TOTAL PROTEIN: 6.8 g/dL (ref 6.0–8.3)
Total Bilirubin: 0.8 mg/dL (ref 0.2–1.2)

## 2013-11-03 ENCOUNTER — Encounter: Payer: Self-pay | Admitting: Family Medicine

## 2014-01-26 ENCOUNTER — Other Ambulatory Visit: Payer: Self-pay | Admitting: Family Medicine

## 2014-03-03 ENCOUNTER — Ambulatory Visit (INDEPENDENT_AMBULATORY_CARE_PROVIDER_SITE_OTHER): Payer: Medicare Other | Admitting: Family Medicine

## 2014-03-03 ENCOUNTER — Telehealth: Payer: Self-pay | Admitting: Home Health Services

## 2014-03-03 ENCOUNTER — Encounter: Payer: Self-pay | Admitting: Family Medicine

## 2014-03-03 VITALS — BP 180/100 | HR 80 | Ht 60.0 in | Wt 176.0 lb

## 2014-03-03 DIAGNOSIS — Z23 Encounter for immunization: Secondary | ICD-10-CM

## 2014-03-03 DIAGNOSIS — M25579 Pain in unspecified ankle and joints of unspecified foot: Secondary | ICD-10-CM

## 2014-03-03 DIAGNOSIS — E785 Hyperlipidemia, unspecified: Secondary | ICD-10-CM

## 2014-03-03 DIAGNOSIS — M25572 Pain in left ankle and joints of left foot: Secondary | ICD-10-CM

## 2014-03-03 DIAGNOSIS — M79609 Pain in unspecified limb: Secondary | ICD-10-CM

## 2014-03-03 DIAGNOSIS — M79672 Pain in left foot: Secondary | ICD-10-CM

## 2014-03-03 DIAGNOSIS — I10 Essential (primary) hypertension: Secondary | ICD-10-CM

## 2014-03-03 DIAGNOSIS — E119 Type 2 diabetes mellitus without complications: Secondary | ICD-10-CM

## 2014-03-03 LAB — POCT GLYCOSYLATED HEMOGLOBIN (HGB A1C): HEMOGLOBIN A1C: 7.2

## 2014-03-03 LAB — HM DIABETES EYE EXAM

## 2014-03-03 MED ORDER — ATORVASTATIN CALCIUM 40 MG PO TABS
40.0000 mg | ORAL_TABLET | Freq: Every day | ORAL | Status: DC
Start: 2014-03-03 — End: 2015-03-06

## 2014-03-03 MED ORDER — LISINOPRIL-HYDROCHLOROTHIAZIDE 20-12.5 MG PO TABS
2.0000 | ORAL_TABLET | Freq: Every day | ORAL | Status: DC
Start: 2014-03-03 — End: 2014-10-07

## 2014-03-03 MED ORDER — METOPROLOL TARTRATE 50 MG PO TABS: 50.0000 mg | ORAL_TABLET | Freq: Two times a day (BID) | ORAL | Status: DC

## 2014-03-03 MED ORDER — METFORMIN HCL 1000 MG PO TABS: 1000.0000 mg | ORAL_TABLET | Freq: Two times a day (BID) | ORAL | Status: DC

## 2014-03-03 NOTE — Assessment & Plan Note (Signed)
Pes planus with TMT bossing - Referral to sports med clinic - would likely benefit from custom orthotics

## 2014-03-03 NOTE — Assessment & Plan Note (Signed)
Good control; A1c 7.2 - Increase metformin: 1000 mg twice a day - Eye today in clinic

## 2014-03-03 NOTE — Telephone Encounter (Signed)
Error

## 2014-03-03 NOTE — Patient Instructions (Signed)
It was great seeing you today.   1. I've referred you to sports medicine to get your foot pain evaluated and likely have shoe inserts made 2. I've increased Metformin to 1000 mg twice a day 3. Refilled your BP medications 4. Start taking Lipitor 40 mg every night   Please bring all your medications to every doctors visit  Sign up for My Chart to have easy access to your labs results, and communication with your Primary care physician.  Next Appointment  Please make an appointment with Dr Berkley Harvey in 1   I look forward to talking with you again at our next visit. If you have any questions or concerns before then, please call the clinic at [phone].  Take Care,   Dr Phill Myron

## 2014-03-03 NOTE — Progress Notes (Signed)
  Patient name: Ashley Sloan MRN [id number]  Date of birth: [birth year]  CC & HPI:  Ashley Sloan is a 63 y.o. female presenting today for DM, HTN and foot pain.   DIABETES   Symptoms of Hypoglycemia? no  Comorbid Symptoms: Denies Chest pain;  SOB;  Neuropathy:  Vision problems  Medication Compliance: yes   Medication Side Effects: Denies n/v/d  CHRONIC HYPERTENSION  BP Readings from Last 3 Encounters:  03/03/14 180/100  10/30/13 158/90  01/17/13 178/86    Disease Monitoring  Chest pain: no   Dyspnea: no   Claudication: no  Medication compliance: no, reports being out of Lisinopril/HCTZ for 1 month  Medication Side Effects: Denies Dizziness/lightheadedness;    Preventitive Healthcare:   History  Smoking status  . Former Smoker -- 0.50 packs/day for 25 years  . Quit date: 12/18/1982  Smokeless tobacco  . Not on file   Foot Pain - she endorses left dorsal midfoot pain that comes and goes for the past several months. She does not notice anything that makes the pain worse.  She has tried ibuprofen which helps some; tried [facility] without any relief. She denies any numbness or tingling.  Denies any swelling or weakness. Denies any recent or previous trauma.   ROS: See HPI   Medical & Surgical Hx:  Reviewed  Medications & Allergies: Reviewed  Social History: Reviewed:   Objective Findings:  Vitals: BP 180/100  Pulse 80  Ht 5' (1.524 m)  Wt 176 lb (79.833 kg)  BMI 34.37 kg/m2  Gen: NAD CV: RRR w/o m/r/g, pulses +2 b/l Resp: CTAB w/ normal respiratory effort Left Foot: Inspection: TMT bossing noted w/o  visible swelling, ecchymosis, erythema. Pes Planus Palpation:   Tenderness over midfoot  ROM: Full in plantarflexion, dorsiflexion, Strength: 5/5 in all directions. Sensation: intact Vascular: intact w/ dorsalis pedis & posterior tibialis pulses 2+  Assessment & Plan:   Please See Problem Focused Assessment & Plan

## 2014-03-03 NOTE — Assessment & Plan Note (Signed)
Poor control today, likely due to medication compliance x1 month - Refill lisinopril/HCTZ Followup in one month for reassessment

## 2014-03-03 NOTE — Assessment & Plan Note (Signed)
ASCVD risk ~ 18% - Previous on Zocor, but not taking - Start Lipitor 40mg  qhs; follow-up in 1 month to assess side-effects

## 2014-03-04 LAB — LDL CHOLESTEROL, DIRECT: LDL DIRECT: 138 mg/dL — AB

## 2014-03-06 ENCOUNTER — Encounter: Payer: Self-pay | Admitting: Family Medicine

## 2014-03-12 ENCOUNTER — Encounter: Payer: Self-pay | Admitting: Family Medicine

## 2014-03-13 ENCOUNTER — Ambulatory Visit
Admission: RE | Admit: 2014-03-13 | Discharge: 2014-03-13 | Disposition: A | Discharge status: Home / Self Care | Payer: Medicare Other | Source: Ambulatory Visit | Attending: Family Medicine | Admitting: Family Medicine

## 2014-03-13 ENCOUNTER — Ambulatory Visit (INDEPENDENT_AMBULATORY_CARE_PROVIDER_SITE_OTHER): Payer: Medicare Other | Admitting: Family Medicine

## 2014-03-13 ENCOUNTER — Encounter: Payer: Self-pay | Admitting: Family Medicine

## 2014-03-13 VITALS — BP 155/74 | HR 62 | Ht 60.0 in | Wt 176.0 lb

## 2014-03-13 DIAGNOSIS — M1380 Other specified arthritis, unspecified site: Secondary | ICD-10-CM

## 2014-03-13 DIAGNOSIS — E11618 Type 2 diabetes mellitus with other diabetic arthropathy: Secondary | ICD-10-CM

## 2014-03-13 DIAGNOSIS — E1169 Type 2 diabetes mellitus with other specified complication: Secondary | ICD-10-CM

## 2014-03-13 DIAGNOSIS — M25572 Pain in left ankle and joints of left foot: Secondary | ICD-10-CM

## 2014-03-13 DIAGNOSIS — M25579 Pain in unspecified ankle and joints of unspecified foot: Secondary | ICD-10-CM

## 2014-03-13 DIAGNOSIS — M21962 Unspecified acquired deformity of left lower leg: Secondary | ICD-10-CM

## 2014-03-13 DIAGNOSIS — M21969 Unspecified acquired deformity of unspecified lower leg: Secondary | ICD-10-CM

## 2014-03-13 NOTE — Progress Notes (Signed)
  Ashley Sloan - 63 y.o. female MRN [id number]  Date of birth: 04-17-1951  SUBJECTIVE:  Including CC & ROS.  The patient is referred by her PCP for evaluation of: Left foot pain: 6+ months of intermittent left sharp shooting foot pain over the dorsum of her foot. Worse with weightbearing and ambulation. Denies any numbness, tingling or electrical sensations. She's had no prior injury or trauma this foot. She does have a noticeable lump on the top of her foot  HISTORY: Past Medical, Surgical, Social, and Family History Reviewed & Updated per EMR. Pertinent Historical Findings include: Hypertension, diabetes (20 year history without significant neurologic or cardiovascular complications), hyperlipidemia, history of breast cancer 1991 - Breast lumpectomy, C-section,  - 0.5ppd smoker for 25 years quit 1984  OBJECTIVE FINDINGS:  VS:  HT:5' (152.4 cm)   WT:176 lb (79.833 kg)  BMI:34.4          BP:155/74 mmHg  HR:62bpm  TEMP: ( )  RESP:   PHYSICAL EXAM:            GENERAL:  Elderly African American female. In no discomfort; no respiratory distress                PSYCH:  alert and appropriate, good insight  Left foot Exam:   APPEAR/PALP:  Left foot with dorsal bossing of the mid foot. No midfoot erythma. No other significant deformity.  No bunion/bunionette. No significant plantar callus.                     ROM:  Ankle plantar/dorsi flexion, normal talar tilt. Restricted mid foot motion.        STRENGTH:  Intrinsic ankle Strength 5+/5.  Appropriate PT recruitment with toe raising                  NV:  Slightly diminished sensation over dorsum of left foot.  DP and PT 2+/4 bilaterally.              Tests:  Negative straight leg raise,  Small amount of erythema at bilateral great toe consistent with repetitive trauma from too small of shoe.  Toe at the end of her shoe with weight bearing  ASSESSMENT: 1. Pain in joint, ankle and foot, left   2. Type 2 diabetes mellitus with other diabetic  arthropathy    Midfoot bossing consistent with degenerative changes but concern for Charcot changes given 20 year history of diabetes. Her shoes are proportionally too small for her and she would likely do better with custom/diabetic/cushioned shoes.  PLAN: See problem based charting & AVS for additional documentation. - Plain film x-ray left foot evaluate baseline degenerative changes - Referral to biotech provided by Dr. Nori Riis for custom diabetic shoes. Encouraged to wear when weightbearing. > Return for to PCP chronic disease management.

## 2014-03-16 DIAGNOSIS — M21969 Unspecified acquired deformity of unspecified lower leg: Secondary | ICD-10-CM | POA: Insufficient documentation

## 2014-03-16 NOTE — Assessment & Plan Note (Signed)
Protective DM shoes necessary and Rx

## 2014-03-16 NOTE — Progress Notes (Signed)
Patient ID: RANEISHA BRESS, female   DOB: 04/10/1951, 63 y.o.   MRN: [id number] [facility]: Attending Note: I have reviewed the chart, discussed wit the Sports Medicine Fellow. I agree with assessment and treatment plan as detailed in the [facility] note. Foot deformity needs imaging. Early Charcot? Needs DM shoes and we have given her Rx and instructions on how to obtain.

## 2014-03-18 ENCOUNTER — Encounter: Payer: Self-pay | Admitting: Family Medicine

## 2014-04-14 ENCOUNTER — Encounter (HOSPITAL_COMMUNITY): Payer: Self-pay | Admitting: Emergency Medicine

## 2014-04-14 ENCOUNTER — Emergency Department (HOSPITAL_COMMUNITY)
Admission: EM | Admit: 2014-04-14 | Discharge: 2014-04-14 | Disposition: A | Discharge status: Home / Self Care | Payer: Medicare Other | Attending: Emergency Medicine | Admitting: Emergency Medicine

## 2014-04-14 DIAGNOSIS — M10071 Idiopathic gout, right ankle and foot: Secondary | ICD-10-CM | POA: Diagnosis not present

## 2014-04-14 DIAGNOSIS — Z87891 Personal history of nicotine dependence: Secondary | ICD-10-CM | POA: Diagnosis not present

## 2014-04-14 DIAGNOSIS — Z7982 Long term (current) use of aspirin: Secondary | ICD-10-CM | POA: Insufficient documentation

## 2014-04-14 DIAGNOSIS — E119 Type 2 diabetes mellitus without complications: Secondary | ICD-10-CM | POA: Insufficient documentation

## 2014-04-14 DIAGNOSIS — M79604 Pain in right leg: Secondary | ICD-10-CM | POA: Diagnosis present

## 2014-04-14 DIAGNOSIS — E785 Hyperlipidemia, unspecified: Secondary | ICD-10-CM | POA: Insufficient documentation

## 2014-04-14 DIAGNOSIS — I10 Essential (primary) hypertension: Secondary | ICD-10-CM | POA: Diagnosis not present

## 2014-04-14 DIAGNOSIS — M109 Gout, unspecified: Secondary | ICD-10-CM

## 2014-04-14 DIAGNOSIS — Z853 Personal history of malignant neoplasm of breast: Secondary | ICD-10-CM | POA: Diagnosis not present

## 2014-04-14 DIAGNOSIS — Z79899 Other long term (current) drug therapy: Secondary | ICD-10-CM | POA: Diagnosis not present

## 2014-04-14 MED ORDER — HYDROCODONE-ACETAMINOPHEN 5-325 MG PO TABS: 1.0000 | ORAL_TABLET | ORAL | Status: DC | PRN

## 2014-04-14 MED ORDER — NAPROXEN 500 MG PO TABS: 500.0000 mg | ORAL_TABLET | Freq: Two times a day (BID) | ORAL | Status: DC

## 2014-04-14 NOTE — ED Notes (Signed)
Blister (not open) on rt. Foot. Been hurting for a few days.

## 2014-04-14 NOTE — ED Provider Notes (Signed)
CSN: [id number]     Arrival date & time 04/14/14  7628 History  This chart was scribed for non-physician practitioner, Dewaine Oats, PA-C, working with Hoy Morn, MD by Ladene Artist, ED Scribe. This patient was seen in room TR07C/TR07C and the patient's care was started at 9:46 AM.   Chief Complaint  Patient presents with  . Foot Pain  . Blister   The history is provided by the patient. No language interpreter was used.   HPI Comments: Ashley Sloan is a 63 y.o. female, with a h/o DM, who presents to the Emergency Department complaining gradually worsening R foot and R great toe pain onset 4-5 days ago. She currently rates her pain 9/10 and describes the pain as a constant throbbing sensation yesterday, intermittent today. Pt reports associated redness and warmth first noted yesterday. She denies discharge or open wounds. She also denies fever, chills, SOB, chest pain, leg pain, numbness/tingling, new leg swelling, HA, visual disturbances at this time. Pt reports "occassional neuropathy". Pt currently ambulates with a limp due to severity of pain. She has not contacted her DM physician for symptoms. Pt reports that her DM is well controlled with her last A1C reading of 7.2 last month. Pt states that she checks the sensation in her feet daily. Pt has elevated her legs with temporary relief. No h/o gout. No medications PTA.   Past Medical History  Diagnosis Date  . Hypertension   . Diabetes mellitus   . Hyperlipidemia   . 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 Cesarean section  1968  . Breast surgery  1991    Lumpectomy   Family History  Problem Relation Age of Onset  . Diabetes Mother   . Hypertension Mother   . COPD Mother   . Hypertension Father   . Heart disease Sister   . Hypertension Brother   . Diabetes Daughter    History  Substance Use Topics  . Smoking status: Former Smoker -- 0.50 packs/day for 25 years    Quit date:  12/18/1982  . Smokeless tobacco: Not on file  . Alcohol Use: No   OB History   Grav Para Term Preterm Abortions TAB SAB Ect Mult Living                 Review of Systems  Constitutional: Negative for fever and chills.  Eyes: Negative for visual disturbance.  Respiratory: Negative for shortness of breath.   Cardiovascular: Negative for chest pain.  Musculoskeletal:       See HPI.  Skin: Positive for color change. Negative for wound.  Neurological: Negative for numbness and headaches.   Allergies  Review of patient's allergies indicates no known allergies.  Home Medications   Prior to Admission medications   Medication Sig Start Date End Date Taking? Authorizing Provider  aspirin 81 MG tablet Take 81 mg by mouth daily.    Historical Provider, MD  atorvastatin (LIPITOR) 40 MG tablet Take 1 tablet (40 mg total) by mouth daily at 6 PM. 03/03/14   Olam Idler, MD  lisinopril-hydrochlorothiazide (PRINZIDE,ZESTORETIC) 20-12.5 MG per tablet Take 2 tablets by mouth daily. 03/03/14   Olam Idler, MD  metFORMIN (GLUCOPHAGE) 1000 MG tablet Take 1 tablet (1,000 mg total) by mouth 2 (two) times daily with a meal. 03/03/14   Olam Idler, MD  metoprolol (LOPRESSOR) 50 MG tablet Take 1 tablet (50 mg total) by mouth 2 (  two) times daily. 03/03/14 05/23/15  Olam Idler, MD   Triage Vitals: BP 197/73  Pulse 65  Temp(Src) 97.8 F (36.6 C) (Oral)  Resp 20  Ht 5' (1.524 m)  Wt 172 lb (78.019 kg)  BMI 33.59 kg/m2  SpO2 98% Physical Exam  Nursing note and vitals reviewed. Constitutional: She is oriented to person, place, and time. She appears well-developed and well-nourished.  HENT:  Head: Normocephalic and atraumatic.  Neck: Neck supple.  Pulmonary/Chest: Effort normal.  Musculoskeletal: Normal range of motion.  Right foot tender over 1st MTP joint with associated erythema. No lesion, blister or rash. Redness extends to dorsal forefoot. Pain with movement of great toe. No calf tenderness.    Neurological: She is alert and oriented to person, place, and time.  Skin: Skin is warm and dry. There is erythema.  Psychiatric: She has a normal mood and affect. Her behavior is normal.   ED Course  Procedures (including critical care time) DIAGNOSTIC STUDIES: Oxygen Saturation is 98% on RA, normal by my interpretation.    COORDINATION OF CARE: 9:56 AM-Discussed treatment plan with pt at bedside and pt agreed to plan.   Labs Review Labs Reviewed - No data to display  Imaging Review No results found.   EKG Interpretation None      MDM   Final diagnoses:  None    1. 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 Venora Maples has seen and evaluated patient.  No fever, pain associated with 1st MTP joint in a diabetic - suspect gout more likely than cellulitis. Start on Indomethacin, provide pain relief. Follow up appointment with PCP-Family Practice arranged for 04/16/14 at 2:00pm for recheck. Skin marked at erythematous borders.   I personally performed the services described in this documentation, which was scribed in my presence. The recorded information has been reviewed and is accurate.    Dewaine Oats, PA-C 04/14/14 [phone]

## 2014-04-14 NOTE — Discharge Instructions (Signed)

## 2014-04-15 NOTE — ED Provider Notes (Signed)
Medical screening examination/treatment/procedure(s) were conducted as a shared visit with non-physician practitioner(s) and myself.  I personally evaluated the patient during the encounter.   EKG Interpretation None      Suspect developing gout. Doubt infection at this time. Have asked pt to follow up in ER in 48 hours for recheck. She understands to return to the ER for new or worsening symtoms  Hoy Morn, MD 04/15/14 1945

## 2014-04-16 ENCOUNTER — Encounter: Payer: Self-pay | Admitting: Family Medicine

## 2014-04-16 ENCOUNTER — Ambulatory Visit (INDEPENDENT_AMBULATORY_CARE_PROVIDER_SITE_OTHER): Payer: Medicare Other | Admitting: Family Medicine

## 2014-04-16 VITALS — BP 189/97 | HR 67 | Temp 98.1°F

## 2014-04-16 DIAGNOSIS — M109 Gout, unspecified: Secondary | ICD-10-CM | POA: Insufficient documentation

## 2014-04-16 DIAGNOSIS — I10 Essential (primary) hypertension: Secondary | ICD-10-CM

## 2014-04-16 DIAGNOSIS — M10071 Idiopathic gout, right ankle and foot: Secondary | ICD-10-CM

## 2014-04-16 MED ORDER — NAPROXEN 500 MG PO TABS: 500.0000 mg | ORAL_TABLET | Freq: Two times a day (BID) | ORAL | Status: DC

## 2014-04-16 NOTE — Patient Instructions (Signed)
Dear Ashley Sloan, Thank you for coming in to clinic today.  Today we discussed your Foot Pain. 1. It looks like you had an episode of Gout - this can be caused by a number of factors. Glad it is getting better. Important to keep taking Naprosyn 500mg  twice daily as needed for pain, this should get better within 1-2 weeks. (sent a refill for you to have more on hand if needed. If you develop future similar episode you can start taking Naprosyn immediately) 2. Also, your blood pressure is elevated today - I recommend seeing your regular doctor soon to re-check BP and discuss medications. The Hydrochlorothiazide (HCTZ) can increase risk of gout, so you probably need to have that medicine changed. Continue taking it for now. 3. To reduce future gout episodes - you should reduce the amount of red meat that you eat, see list below for other preventative strategies.  Some important numbers from today's visit: BP - 189/97 >>> re-checked at 180 / 70  Please schedule a follow-up appointment with Dr. Berkley Harvey in 1-2 weeks for follow-up and BP re-check.  If you have any other questions or concerns, please feel free to call the clinic to contact me. You may also schedule an earlier appointment if necessary.  However, if your symptoms get significantly worse, please go to the Emergency Department to seek immediate medical attention.  Nobie Putnam, DO [facility]   Gout Gout is an inflammatory arthritis caused by a buildup of uric acid crystals in the joints. Uric acid is a chemical that is normally present in the blood. When the level of uric acid in the blood is too high it can form crystals that deposit in your joints and tissues. This causes joint redness, soreness, and swelling (inflammation). Repeat attacks are common. Over time, uric acid crystals can form into masses (tophi) near a joint, destroying bone and causing disfigurement. Gout is treatable and often preventable. CAUSES   The disease begins with elevated levels of uric acid in the blood. Uric acid is produced by your body when it breaks down a naturally found substance called purines. Certain foods you eat, such as meats and fish, contain high amounts of purines. Causes of an elevated uric acid level include:  Being passed down from parent to child (heredity).  Diseases that cause increased uric acid production (such as obesity, psoriasis, and certain cancers).  Excessive alcohol use.  Diet, especially diets rich in meat and seafood.  Medicines, including certain cancer-fighting medicines (chemotherapy), water pills (diuretics), and aspirin.  Chronic kidney disease. The kidneys are no longer able to remove uric acid well.  Problems with metabolism. Conditions strongly associated with gout include:  Obesity.  High blood pressure.  High cholesterol.  Diabetes. Not everyone with elevated uric acid levels gets gout. It is not understood why some people get gout and others do not. Surgery, joint injury, and eating too much of certain foods are some of the factors that can lead to gout attacks. SYMPTOMS   An attack of gout comes on quickly. It causes intense pain with redness, swelling, and warmth in a joint.  Fever can occur.  Often, only one joint is involved. Certain joints are more commonly involved:  Base of the big toe.  Knee.  Ankle.  Wrist.  Finger. Without treatment, an attack usually goes away in a few days to weeks. Between attacks, you usually will not have symptoms, which is different from many other forms of arthritis. DIAGNOSIS  Your caregiver will suspect gout based on your symptoms and exam. In some cases, tests may be recommended. The tests may include:  Blood tests.  Urine tests.  X-rays.  Joint fluid exam. This exam requires a needle to remove fluid from the joint (arthrocentesis). Using a microscope, gout is confirmed when uric acid crystals are seen in the joint  fluid. TREATMENT  There are two phases to gout treatment: treating the sudden onset (acute) attack and preventing attacks (prophylaxis).  Treatment of an Acute Attack.  Medicines are used. These include anti-inflammatory medicines or steroid medicines.  An injection of steroid medicine into the affected joint is sometimes necessary.  The painful joint is rested. Movement can worsen the arthritis.  You may use warm or cold treatments on painful joints, depending which works best for you.  Treatment to Prevent Attacks.  If you suffer from frequent gout attacks, your caregiver may advise preventive medicine. These medicines are started after the acute attack subsides. These medicines either help your kidneys eliminate uric acid from your body or decrease your uric acid production. You may need to stay on these medicines for a very long time.  The early phase of treatment with preventive medicine can be associated with an increase in acute gout attacks. For this reason, during the first few months of treatment, your caregiver may also advise you to take medicines usually used for acute gout treatment. Be sure you understand your caregiver's directions. Your caregiver may make several adjustments to your medicine dose before these medicines are effective.  Discuss dietary treatment with your caregiver or dietitian. Alcohol and drinks high in sugar and fructose and foods such as meat, poultry, and seafood can increase uric acid levels. Your caregiver or dietitian can advise you on drinks and foods that should be limited. HOME CARE INSTRUCTIONS   Do not take aspirin to relieve pain. This raises uric acid levels.  Only take over-the-counter or prescription medicines for pain, discomfort, or fever as directed by your caregiver.  Rest the joint as much as possible. When in bed, keep sheets and blankets off painful areas.  Keep the affected joint raised (elevated).  Apply warm or cold treatments  to painful joints. Use of warm or cold treatments depends on which works best for you.  Use crutches if the painful joint is in your leg.  Drink enough fluids to keep your urine clear or pale yellow. This helps your body get rid of uric acid. Limit alcohol, sugary drinks, and fructose drinks.  Follow your dietary instructions. Pay careful attention to the amount of protein you eat. Your daily diet should emphasize fruits, vegetables, whole grains, and fat-free or low-fat milk products. Discuss the use of coffee, vitamin C, and cherries with your caregiver or dietitian. These may be helpful in lowering uric acid levels.  Maintain a healthy body weight. SEEK MEDICAL CARE IF:   You develop diarrhea, vomiting, or any side effects from medicines.  You do not feel better in 24 hours, or you are getting worse. SEEK IMMEDIATE MEDICAL CARE IF:   Your joint becomes suddenly more tender, and you have chills or a fever. MAKE SURE YOU:   Understand these instructions.  Will watch your condition.  Will get help right away if you are not doing well or get worse. Document Released: 06/16/2000 Document Revised: 11/03/2013 Document Reviewed: 01/31/2012 [facility] Patient Information 2015 [place], [place]. This information is not intended to replace advice given to you by your health care provider. Make  sure you discuss any questions you have with your health care provider.

## 2014-04-16 NOTE — Progress Notes (Signed)
   Subjective:    Patient ID: Ashley Sloan, female    DOB: August 11, 1950, 63 y.o.   MRN: [id number]  Patient presents for a same day appointment.  HPI  RIGHT FOOT PAIN / GOUT: - Reports symptoms of gradual pain for about 1-3 weeks, denies any accidents, injuries, falls. Stated that she went to ED on Tuesday 04/14/14 and was told she had "Gout" in her right foot, denies any prior history of gout before. Described acute worsening of pain over past 3 days with sudden onset while resting "severe pain" to any touch, even with "putting a sock on", described mid-foot near big toe as "red and hot", worse with ambulation - Tried Ibuprofen 200mg  (up to 4-6 tabs daily) with some but no significant relief - At ED prescribed Naprosyn 500mg  BID with significant improvement - Denies any recent med changes - Diet: admits frequent red meat, no alcohol - Denies fevers/chills, no other joint pain, redness or rash, nausea, vomiting, abdominal pain  CHRONIC HTN: Reports - no concerns (chart review with difficult to control BP) Current Meds - Lisinopril-HCTZ 20-12.5mg  (2 tabs daily), Metoprolol 50mg  BID Reports good compliance, took meds today. Tolerating well, w/o complaints. Denies CP, dyspnea, HA, edema, dizziness / lightheadedness  I have reviewed and updated the following as appropriate: allergies and current medications  Social Hx:  - Former smoker - Denies drinking alcohol  Review of Systems  See above HPI    Objective:   Physical Exam  BP 189/97  Pulse 67  Temp(Src) 98.1 F (36.7 C) (Oral)  Re-check BP: 180/70  Gen - well-appearing, very pleasant, NAD HEENT - NCAT, PERRL, EOMI, oropharynx clear, MMM Neck - supple, non-tender Heart - RRR, no murmurs heard Ext - Right Foot: normal appearing, symmetrical to Left, without edema, no significant tenderness, very mild residual discomfort regular palpation of R-great toe and medial-midfoot, no erythema, full active ROM, ankle non-tender no  edema.Peripheral pulses intact +2 b/l Skin - warm, dry, no rashes Neuro - alert, grossly non-focal, intact muscle strength 5/5 b/l ankles, intact distal sensation to light touch, gait normal     Assessment & Plan:   See specific A&P problem list for details.

## 2014-04-17 NOTE — Assessment & Plan Note (Addendum)
History consistent with acute Gouty flare of Right foot (great toe / medial-midfoot), 1st episode no prior gout hx, currently mostly resolved after treatment with Naprosyn x 48 hours from ED (04/14/14) - No signs of infection, other joint involvement, tolerating ambulation  Plan: 1. Continue Naprosyn 500mg  BID x 1 week total or until symptoms resolved, re-ordered Naprosyn rx for patient to have for future episode 2. Advised reduce dietary red meat, sweetened drinks 3. Additionally continues on HCTZ 25mg  daily, discussed inc risk of future gout flare on this medication, and anticipate to be discontinued in future if BP able to be controlled 4. Consider Uric acid blood work if future flares, may try colchicine if not responding to Naprosyn. Monitor frequency of flares before considering preventative medication

## 2014-04-17 NOTE — Assessment & Plan Note (Signed)
Remains poor controlled HTN, improved but elevated on re-check  No complications   Plan:  1. Continue current BP meds - Lisinopril-HCTZ 20-12.5mg  (x 2, daily) and Metoprolol 2. Recommend adding 4th agent for BP control such as Amlodipine (CCB seems appropriate choice for this patient, and prior history of tolerating). However, given acute gout flare (1st episode) recommend switching patient off of HCTZ as well, but may not have optimal control unless meds titrated. Also Losartan with some uricosuric effects may be appropriate transition from ACE to ARB 3. Discussed options with patient, agreed to defer significant BP regimen changes at this time (some concern for occasional missed doses) 4. Plan to RTC 1-2 weeks for BP re-check and address BP med regimen at that time

## 2014-06-29 ENCOUNTER — Ambulatory Visit: Payer: Medicare Other | Admitting: Internal Medicine

## 2014-10-07 ENCOUNTER — Other Ambulatory Visit: Payer: Self-pay | Admitting: Family Medicine

## 2014-11-29 ENCOUNTER — Emergency Department (HOSPITAL_COMMUNITY)
Admission: EM | Admit: 2014-11-29 | Discharge: 2014-11-29 | Disposition: A | Discharge status: Home / Self Care | Payer: PPO | Attending: Emergency Medicine | Admitting: Emergency Medicine

## 2014-11-29 ENCOUNTER — Encounter (HOSPITAL_COMMUNITY): Payer: Self-pay | Admitting: Emergency Medicine

## 2014-11-29 DIAGNOSIS — R05 Cough: Secondary | ICD-10-CM | POA: Diagnosis present

## 2014-11-29 DIAGNOSIS — Z853 Personal history of malignant neoplasm of breast: Secondary | ICD-10-CM | POA: Insufficient documentation

## 2014-11-29 DIAGNOSIS — E119 Type 2 diabetes mellitus without complications: Secondary | ICD-10-CM | POA: Insufficient documentation

## 2014-11-29 DIAGNOSIS — Z7982 Long term (current) use of aspirin: Secondary | ICD-10-CM | POA: Diagnosis not present

## 2014-11-29 DIAGNOSIS — M109 Gout, unspecified: Secondary | ICD-10-CM | POA: Diagnosis not present

## 2014-11-29 DIAGNOSIS — J209 Acute bronchitis, unspecified: Secondary | ICD-10-CM | POA: Diagnosis not present

## 2014-11-29 DIAGNOSIS — Z79899 Other long term (current) drug therapy: Secondary | ICD-10-CM | POA: Insufficient documentation

## 2014-11-29 DIAGNOSIS — I10 Essential (primary) hypertension: Secondary | ICD-10-CM | POA: Diagnosis not present

## 2014-11-29 DIAGNOSIS — H109 Unspecified conjunctivitis: Secondary | ICD-10-CM | POA: Diagnosis not present

## 2014-11-29 DIAGNOSIS — E785 Hyperlipidemia, unspecified: Secondary | ICD-10-CM | POA: Diagnosis not present

## 2014-11-29 DIAGNOSIS — Z87891 Personal history of nicotine dependence: Secondary | ICD-10-CM | POA: Insufficient documentation

## 2014-11-29 MED ORDER — METOPROLOL TARTRATE 25 MG PO TABS
50.0000 mg | ORAL_TABLET | Freq: Once | ORAL | Status: AC
  Administered 2014-11-29: 50 mg via ORAL
  Filled 2014-11-29: qty 2

## 2014-11-29 MED ORDER — LISINOPRIL 20 MG PO TABS
20.0000 mg | ORAL_TABLET | Freq: Once | ORAL | Status: AC
  Administered 2014-11-29: 20 mg via ORAL
  Filled 2014-11-29: qty 1

## 2014-11-29 MED ORDER — PREDNISONE 20 MG PO TABS: ORAL_TABLET | ORAL | Status: DC

## 2014-11-29 MED ORDER — HYDROCODONE-ACETAMINOPHEN 5-325 MG PO TABS: 1.0000 | ORAL_TABLET | ORAL | Status: DC | PRN

## 2014-11-29 MED ORDER — BENZONATATE 100 MG PO CAPS: 100.0000 mg | ORAL_CAPSULE | Freq: Three times a day (TID) | ORAL | Status: DC

## 2014-11-29 MED ORDER — SULFACETAMIDE SODIUM 10 % OP SOLN: 1.0000 [drp] | OPHTHALMIC | Status: DC

## 2014-11-29 NOTE — ED Provider Notes (Signed)
CSN: [id number]     Arrival date & time 11/29/14  0810 History   First MD Initiated Contact with Patient 11/29/14 0818     Chief Complaint  Patient presents with  . Conjunctivitis  . Cough  . Gout     (Consider location/radiation/quality/duration/timing/severity/associated sxs/prior Treatment) HPI First symptom was cough starting approximately 2 weeks ago. It has been dry with no associated chest pain or shortness of breath. Last week the patient believes she had fever associated however did not measure her temperature. At that time she also reports myalgias. Those symptoms have resolved and the patient persists with a dry cough that is worse at night. She developed a red inflamed eye on the left yesterday. No associated visual change, pain or itching. She does state there is been clear drainage and tearing. 2 days ago the patient developed pain at her right, first metatarsal joint consistent with prior gout flareups. She states now there is a mild amount of redness and the base of the toe is tender. In the past with a flare she reports she has been a lot more redness and swelling. No other joints are involved at this time. Of note the patient's blood pressure is significantly elevated, she reports that she has not taken her morning medications, she does not endorse any signs of endorgan damage. Past Medical History  Diagnosis Date  . Hypertension   . Diabetes mellitus   . Hyperlipidemia   . 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 Cesarean section  1968  . Breast surgery  1991    Lumpectomy   Family History  Problem Relation Age of Onset  . Diabetes Mother   . Hypertension Mother   . COPD Mother   . Hypertension Father   . Heart disease Sister   . Hypertension Brother   . Diabetes Daughter    History  Substance Use Topics  . Smoking status: Former Smoker -- 0.50 packs/day for 25 years    Quit date: 12/18/1982  . Smokeless tobacco: Not on  file  . Alcohol Use: No   OB History    No data available     Review of Systems 10 Systems reviewed and are negative for acute change except as noted in the HPI.    Allergies  Review of patient's allergies indicates no known allergies.  Home Medications   Prior to Admission medications   Medication Sig Start Date End Date Taking? Authorizing Provider  acetaminophen (TYLENOL) 500 MG tablet Take 1,000 mg by mouth every 6 (six) hours as needed for mild pain.   Yes Historical Provider, MD  aspirin 81 MG tablet Take 81 mg by mouth daily.   Yes Historical Provider, MD  atorvastatin (LIPITOR) 40 MG tablet Take 1 tablet (40 mg total) by mouth daily at 6 PM. 03/03/14  Yes Olam Idler, MD  ibuprofen (ADVIL,MOTRIN) 200 MG tablet Take 200 mg by mouth every 4 (four) hours as needed for mild pain.   Yes Historical Provider, MD  lisinopril-hydrochlorothiazide (PRINZIDE,ZESTORETIC) 20-12.5 MG per tablet take 2 tablets by mouth once daily 10/08/14  Yes Olam Idler, MD  metFORMIN (GLUCOPHAGE) 1000 MG tablet Take 1 tablet (1,000 mg total) by mouth 2 (two) times daily with a meal. 03/03/14  Yes Olam Idler, MD  metoprolol (LOPRESSOR) 50 MG tablet Take 1 tablet (50 mg total) by mouth 2 (two) times daily. 03/03/14 05/23/15 Yes Olam Idler, MD  Phenyleph-Doxylamine-DM-APAP (  ALKA SELTZER PLUS PO) Take 1 tablet by mouth 2 (two) times daily as needed (cold).   Yes Historical Provider, MD  Pseudoephedrine-APAP-DM (DAYQUIL PO) Take 1 capsule by mouth 2 (two) times daily as needed (cough).   Yes Historical Provider, MD  benzonatate (TESSALON) 100 MG capsule Take 1 capsule (100 mg total) by mouth every 8 (eight) hours. 11/29/14   Charlesetta Shanks, MD  HYDROcodone-acetaminophen (NORCO/VICODIN) 5-325 MG per tablet Take 1-2 tablets by mouth every 4 (four) hours as needed for moderate pain or severe pain. 11/29/14   Charlesetta Shanks, MD  predniSONE (DELTASONE) 20 MG tablet 3 tabs po day one, then 2 po daily x 4 days  11/29/14   Charlesetta Shanks, MD  sulfacetamide (BLEPH-10) 10 % ophthalmic solution Place 1-2 drops into the left eye every 4 (four) hours. 11/29/14   Charlesetta Shanks, MD   BP 178/76 mmHg  Pulse 72  Temp(Src) 97.7 F (36.5 C) (Oral)  Resp 18  Ht 5' (1.524 m)  Wt 173 lb (78.472 kg)  BMI 33.79 kg/m2  SpO2 100% Physical Exam  Constitutional: She is oriented to person, place, and time. She appears well-developed and well-nourished.  HENT:  Head: Normocephalic and atraumatic.  Right Ear: External ear normal.  Left Ear: External ear normal.  Nose: Nose normal.  Mouth/Throat: Oropharynx is clear and moist.  Eyes: EOM are normal. Pupils are equal, round, and reactive to light. Left eye exhibits no discharge.  Left eye has diffuse conjunctival injection. The pupil is symmetric and light responsive without photophobia. No periorbital edema.  Neck: Neck supple.  Cardiovascular: Normal rate, regular rhythm, normal heart sounds and intact distal pulses.   Pulmonary/Chest: Effort normal and breath sounds normal.  Patient has moderately frequent dry cough. No respiratory distress and normal pulmonary examination.  Abdominal: Soft. Bowel sounds are normal. She exhibits no distension. There is no tenderness.  Musculoskeletal: Normal range of motion. She exhibits tenderness. She exhibits no edema.  Mild to moderate tenderness at the first metatarsal joint on the right. Mild erythema is perceivable. No large inflammation or swelling at this time. Patient's calves are soft and nontender with no peripheral edema.  Neurological: She is alert and oriented to person, place, and time. She has normal strength. Coordination normal. GCS eye subscore is 4. GCS verbal subscore is 5. GCS motor subscore is 6.  Skin: Skin is warm, dry and intact.  Psychiatric: She has a normal mood and affect.    ED Course  Procedures (including critical care time) Labs Review Labs Reviewed - No data to display  Imaging Review No  results found.   EKG Interpretation None      MDM   Final diagnoses:  Acute gout of right foot, unspecified cause  Conjunctivitis of left eye  Acute bronchitis, unspecified organism   2 weeks of cough without sputum production, fever, dyspnea or chest pain is consistent with a viral bronchitis pattern at this time. Patient will be given Tessalon Perles. Metatarsal pain consistent with gout flare. Due to patient's comorbidity with hypertension I will this point avoid NSAID and opt to treat with a short course of prednisone and Vicodin for pain control. Patient has painless injection of the left eye without any visual complaints, at this time she'll be treated with sulfacetamide for conjunctivitis. Patient was chronic essential hypertension with no end organ damage, will be given normal a.m. medications.    Charlesetta Shanks, MD 11/29/14 [id number]

## 2014-11-29 NOTE — ED Notes (Signed)
Pt reports woke up yesterday with redness to left eye. Pt tried visine without relief. Pt reports  Non productive cough and ache in her back started 2 weeks ago. Pt reports gout in right toe onset Friday, pt has history of same. Pt has been eating red meats.

## 2014-11-29 NOTE — Discharge Instructions (Signed)
Acute Bronchitis Bronchitis is inflammation of the airways that extend from the windpipe into the lungs (bronchi). The inflammation often causes mucus to develop. This leads to a cough, which is the most common symptom of bronchitis.  In acute bronchitis, the condition usually develops suddenly and goes away over time, usually in a couple weeks. Smoking, allergies, and asthma can make bronchitis worse. Repeated episodes of bronchitis may cause further lung problems.  CAUSES Acute bronchitis is most often caused by the same virus that causes a cold. The virus can spread from person to person (contagious) through coughing, sneezing, and touching contaminated objects. SIGNS AND SYMPTOMS   Cough.   Fever.   Coughing up mucus.   Body aches.   Chest congestion.   Chills.   Shortness of breath.   Sore throat.  DIAGNOSIS  Acute bronchitis is usually diagnosed through a physical exam. Your health care provider will also ask you questions about your medical history. Tests, such as chest X-rays, are sometimes done to rule out other conditions.  TREATMENT  Acute bronchitis usually goes away in a couple weeks. Oftentimes, no medical treatment is necessary. Medicines are sometimes given for relief of fever or cough. Antibiotic medicines are usually not needed but may be prescribed in certain situations. In some cases, an inhaler may be recommended to help reduce shortness of breath and control the cough. A cool mist vaporizer may also be used to help thin bronchial secretions and make it easier to clear the chest.  HOME CARE INSTRUCTIONS  Get plenty of rest.   Drink enough fluids to keep your urine clear or pale yellow (unless you have a medical condition that requires fluid restriction). Increasing fluids may help thin your respiratory secretions (sputum) and reduce chest congestion, and it will prevent dehydration.   Take medicines only as directed by your health care provider.  If  you were prescribed an antibiotic medicine, finish it all even if you start to feel better.  Avoid smoking and secondhand smoke. Exposure to cigarette smoke or irritating chemicals will make bronchitis worse. If you are a smoker, consider using nicotine gum or skin patches to help control withdrawal symptoms. Quitting smoking will help your lungs heal faster.   Reduce the chances of another bout of acute bronchitis by washing your hands frequently, avoiding people with cold symptoms, and trying not to touch your hands to your mouth, nose, or eyes.   Keep all follow-up visits as directed by your health care provider.  SEEK MEDICAL CARE IF: Your symptoms do not improve after 1 week of treatment.  SEEK IMMEDIATE MEDICAL CARE IF:  You develop an increased fever or chills.   You have chest pain.   You have severe shortness of breath.  You have bloody sputum.   You develop dehydration.  You faint or repeatedly feel like you are going to pass out.  You develop repeated vomiting.  You develop a severe headache. MAKE SURE YOU:   Understand these instructions.  Will watch your condition.  Will get help right away if you are not doing well or get worse. Document Released: 07/27/2004 Document Revised: 11/03/2013 Document Reviewed: 12/10/2012 [facility] Patient Information 2015 [place], [place]. This information is not intended to replace advice given to you by your health care provider. Make sure you discuss any questions you have with your health care provider. Gout Gout is an inflammatory arthritis caused by a buildup of uric acid crystals in the joints. Uric acid is a chemical that is  normally present in the blood. When the level of uric acid in the blood is too high it can form crystals that deposit in your joints and tissues. This causes joint redness, soreness, and swelling (inflammation). Repeat attacks are common. Over time, uric acid crystals can form into masses (tophi) near a  joint, destroying bone and causing disfigurement. Gout is treatable and often preventable. CAUSES  The disease begins with elevated levels of uric acid in the blood. Uric acid is produced by your body when it breaks down a naturally found substance called purines. Certain foods you eat, such as meats and fish, contain high amounts of purines. Causes of an elevated uric acid level include:  Being passed down from parent to child (heredity).  Diseases that cause increased uric acid production (such as obesity, psoriasis, and certain cancers).  Excessive alcohol use.  Diet, especially diets rich in meat and seafood.  Medicines, including certain cancer-fighting medicines (chemotherapy), water pills (diuretics), and aspirin.  Chronic kidney disease. The kidneys are no longer able to remove uric acid well.  Problems with metabolism. Conditions strongly associated with gout include:  Obesity.  High blood pressure.  High cholesterol.  Diabetes. Not everyone with elevated uric acid levels gets gout. It is not understood why some people get gout and others do not. Surgery, joint injury, and eating too much of certain foods are some of the factors that can lead to gout attacks. SYMPTOMS   An attack of gout comes on quickly. It causes intense pain with redness, swelling, and warmth in a joint.  Fever can occur.  Often, only one joint is involved. Certain joints are more commonly involved:  Base of the big toe.  Knee.  Ankle.  Wrist.  Finger. Without treatment, an attack usually goes away in a few days to weeks. Between attacks, you usually will not have symptoms, which is different from many other forms of arthritis. DIAGNOSIS  Your caregiver will suspect gout based on your symptoms and exam. In some cases, tests may be recommended. The tests may include:  Blood tests.  Urine tests.  X-rays.  Joint fluid exam. This exam requires a needle to remove fluid from the joint  (arthrocentesis). Using a microscope, gout is confirmed when uric acid crystals are seen in the joint fluid. TREATMENT  There are two phases to gout treatment: treating the sudden onset (acute) attack and preventing attacks (prophylaxis).  Treatment of an Acute Attack.  Medicines are used. These include anti-inflammatory medicines or steroid medicines.  An injection of steroid medicine into the affected joint is sometimes necessary.  The painful joint is rested. Movement can worsen the arthritis.  You may use warm or cold treatments on painful joints, depending which works best for you.  Treatment to Prevent Attacks.  If you suffer from frequent gout attacks, your caregiver may advise preventive medicine. These medicines are started after the acute attack subsides. These medicines either help your kidneys eliminate uric acid from your body or decrease your uric acid production. You may need to stay on these medicines for a very long time.  The early phase of treatment with preventive medicine can be associated with an increase in acute gout attacks. For this reason, during the first few months of treatment, your caregiver may also advise you to take medicines usually used for acute gout treatment. Be sure you understand your caregiver's directions. Your caregiver may make several adjustments to your medicine dose before these medicines are effective.  Discuss dietary treatment  with your caregiver or dietitian. Alcohol and drinks high in sugar and fructose and foods such as meat, poultry, and seafood can increase uric acid levels. Your caregiver or dietitian can advise you on drinks and foods that should be limited. HOME CARE INSTRUCTIONS   Do not take aspirin to relieve pain. This raises uric acid levels.  Only take over-the-counter or prescription medicines for pain, discomfort, or fever as directed by your caregiver.  Rest the joint as much as possible. When in bed, keep sheets and  blankets off painful areas.  Keep the affected joint raised (elevated).  Apply warm or cold treatments to painful joints. Use of warm or cold treatments depends on which works best for you.  Use crutches if the painful joint is in your leg.  Drink enough fluids to keep your urine clear or pale yellow. This helps your body get rid of uric acid. Limit alcohol, sugary drinks, and fructose drinks.  Follow your dietary instructions. Pay careful attention to the amount of protein you eat. Your daily diet should emphasize fruits, vegetables, whole grains, and fat-free or low-fat milk products. Discuss the use of coffee, vitamin C, and cherries with your caregiver or dietitian. These may be helpful in lowering uric acid levels.  Maintain a healthy body weight. SEEK MEDICAL CARE IF:   You develop diarrhea, vomiting, or any side effects from medicines.  You do not feel better in 24 hours, or you are getting worse. SEEK IMMEDIATE MEDICAL CARE IF:   Your joint becomes suddenly more tender, and you have chills or a fever. MAKE SURE YOU:   Understand these instructions.  Will watch your condition.  Will get help right away if you are not doing well or get worse. Document Released: 06/16/2000 Document Revised: 11/03/2013 Document Reviewed: 01/31/2012 [facility] Patient Information 2015 [place], [place]. This information is not intended to replace advice given to you by your health care provider. Make sure you discuss any questions you have with your health care provider. Conjunctivitis Conjunctivitis is commonly called "pink eye." Conjunctivitis can be caused by bacterial or viral infection, allergies, or injuries. There is usually redness of the lining of the eye, itching, discomfort, and sometimes discharge. There may be deposits of matter along the eyelids. A viral infection usually causes a watery discharge, while a bacterial infection causes a yellowish, thick discharge. Pink eye is very contagious  and spreads by direct contact. You may be given antibiotic eyedrops as part of your treatment. Before using your eye medicine, remove all drainage from the eye by washing gently with warm water and cotton balls. Continue to use the medication until you have awakened 2 mornings in a row without discharge from the eye. Do not rub your eye. This increases the irritation and helps spread infection. Use separate towels from other household members. Wash your hands with soap and water before and after touching your eyes. Use cold compresses to reduce pain and sunglasses to relieve irritation from light. Do not wear contact lenses or wear eye makeup until the infection is gone. SEEK MEDICAL CARE IF:   Your symptoms are not better after 3 days of treatment.  You have increased pain or trouble seeing.  The outer eyelids become very red or swollen. Document Released: 07/27/2004 Document Revised: 09/11/2011 Document Reviewed: 06/19/2005 [facility] Patient Information 2015 [place], [place]. This information is not intended to replace advice given to you by your health care provider. Make sure you discuss any questions you have with your health care  provider.

## 2015-01-07 ENCOUNTER — Other Ambulatory Visit: Payer: Self-pay | Admitting: Family Medicine

## 2015-03-06 ENCOUNTER — Other Ambulatory Visit: Payer: Self-pay | Admitting: Family Medicine

## 2015-03-09 NOTE — Telephone Encounter (Signed)
Patient has an appt 03-10-15. Ashley Sloan,CMA

## 2015-03-09 NOTE — Telephone Encounter (Signed)
Please call - Needs PCP visit for DM, HLD, and HTN check-up 2 month prescriptions provided.

## 2015-03-10 ENCOUNTER — Encounter: Payer: Self-pay | Admitting: Family Medicine

## 2015-03-10 ENCOUNTER — Ambulatory Visit (INDEPENDENT_AMBULATORY_CARE_PROVIDER_SITE_OTHER): Payer: PPO | Admitting: Family Medicine

## 2015-03-10 VITALS — BP 172/88 | HR 88 | Temp 98.1°F | Ht 60.0 in | Wt 174.0 lb

## 2015-03-10 DIAGNOSIS — E785 Hyperlipidemia, unspecified: Secondary | ICD-10-CM

## 2015-03-10 DIAGNOSIS — M79672 Pain in left foot: Secondary | ICD-10-CM | POA: Diagnosis not present

## 2015-03-10 DIAGNOSIS — I10 Essential (primary) hypertension: Secondary | ICD-10-CM

## 2015-03-10 DIAGNOSIS — M21962 Unspecified acquired deformity of left lower leg: Secondary | ICD-10-CM | POA: Diagnosis not present

## 2015-03-10 DIAGNOSIS — M79605 Pain in left leg: Secondary | ICD-10-CM | POA: Insufficient documentation

## 2015-03-10 DIAGNOSIS — E11618 Type 2 diabetes mellitus with other diabetic arthropathy: Secondary | ICD-10-CM | POA: Diagnosis not present

## 2015-03-10 LAB — POCT GLYCOSYLATED HEMOGLOBIN (HGB A1C): HEMOGLOBIN A1C: 7.7

## 2015-03-10 MED ORDER — ATORVASTATIN CALCIUM 40 MG PO TABS: 40.0000 mg | ORAL_TABLET | Freq: Every day | ORAL | Status: DC

## 2015-03-10 MED ORDER — METFORMIN HCL 1000 MG PO TABS: 1000.0000 mg | ORAL_TABLET | Freq: Two times a day (BID) | ORAL | Status: DC

## 2015-03-10 MED ORDER — LISINOPRIL-HYDROCHLOROTHIAZIDE 20-12.5 MG PO TABS: 2.0000 | ORAL_TABLET | Freq: Two times a day (BID) | ORAL | Status: DC

## 2015-03-10 NOTE — Assessment & Plan Note (Signed)
Completed form for diabetic shoes

## 2015-03-10 NOTE — Patient Instructions (Addendum)
It was great seeing you today.   1. Start taking your blood pressure medications again.  Check your blood pressure every day at work and bring a list with her blood pressures to your next visit in 2 weeks.  2. Continue to take tylenol as needed for foot pain.  3. I have filled out the form for you to get diabetic shoes.  Call if any difficulties getting these.    Please bring all your medications to every doctors visit  Sign up for My Chart to have easy access to your labs results, and communication with your Primary care physician.  Next Appointment  Please make an appointment with Dr Berkley Harvey in 2 weeks for high blood pressure   I look forward to talking with you again at our next visit. If you have any questions or concerns before then, please call the clinic at [phone].  Take Care,   Dr Phill Myron

## 2015-03-10 NOTE — Progress Notes (Signed)
  Patient name: Ashley Sloan MRN [id number]  Date of birth: 12-09-1950  CC & HPI:  Ashley Sloan is a 64 y.o. female presenting today for HTN and Left foot pain.   Left foot pain - Continues to report pain in left dorsal midfoot that is worse with weightbearing activity - Additionally reports left posterior heel pain for the past 1-2 weeks, which occurs intermittently and is worse with walking - Denies fevers, chills, erythema or swelling - She was seen by sports medicine clinic and recommended for diabetic shoes; however, she was unable to get these because they require a note from her PCP  CHRONIC HYPERTENSION  BP Readings from Last 3 Encounters:  03/10/15 172/88  11/29/14 198/78  04/16/14 189/97    Disease Monitoring  Blood pressure range outside clinc: not checking  Chest pain: no   Dyspnea: no   Claudication: no  Medication compliance: no, has not taken any of her blood pressure medications for several weeks.  Reports stress due to death of her granddaughter several months ago  Medication Side Effects: No taking  Preventitive Healthcare:   History  Smoking status  . Former Smoker -- 0.50 packs/day for 25 years  . Quit date: 12/18/1982  Smokeless tobacco  . Not on file   DIABETES   Blood Sugar Ranges: not checking  Symptoms of Hypoglycemia? no  Comorbid Symptoms: no Chest pain; no SOB; no Neuropathy: no Vision problems  Medication Compliance: no, out of metformin   Medication Side Effects: no  ROS: See HPI   Medical & Surgical Hx:  Reviewed  Medications & Allergies: Reviewed  Social History: Reviewed:   Objective Findings:  Vitals: BP 172/88 mmHg  Pulse 88  Temp(Src) 98.1 F (36.7 C) (Oral)  Ht 5' (1.524 m)  Wt 174 lb (78.926 kg)  BMI 33.98 kg/m2  Gen: NAD CV: RRR w/o m/r/g, pulses +2 b/l Resp: CTAB w/ normal respiratory effort Foot Exam: Pulses 2+; Ulcers none, bruises or cuts; Monofilament testing: Sensation intact b/l; Left foot:  metatarsal head  bossing,  tenderness Achilles insertion  Assessment & Plan:   HTN (hypertension), benign Extremely elevated blood pressure today as well as past several visits; denies headache, chest pain, shortness of breath, vision changes.  Long discussion about consequences of chronically elevated blood pressure.  She has been out of medications for several weeks and reports stress due to death of her granddaughter several months ago.  - Refilled lisinopril, HCTZ, and Metoprolol  - As patient to check blood pressure at work [employer] and bring to her visit in 2 weeks - Follow-up in 2 weeks to recheck blood pressure after restarting medications   Diabetes mellitus, type 2 A1c well controlled - Continue metformin - Advised patient to send copy of recent ophthalmology visit - Foot exam completed today with sensation and pulses intact; left foot metatarsal bossing and Achilles tendon insertion pain; completed form for diabetic shoes   Left foot pain Completed form for diabetic shoes

## 2015-03-10 NOTE — Assessment & Plan Note (Signed)
Extremely elevated blood pressure today as well as past several visits; denies headache, chest pain, shortness of breath, vision changes.  Long discussion about consequences of chronically elevated blood pressure.  She has been out of medications for several weeks and reports stress due to death of her granddaughter several months ago.  - Refilled lisinopril, HCTZ, and Metoprolol  - As patient to check blood pressure at work [employer] and bring to her visit in 2 weeks - Follow-up in 2 weeks to recheck blood pressure after restarting medications

## 2015-03-10 NOTE — Assessment & Plan Note (Signed)
A1c well controlled - Continue metformin - Advised patient to send copy of recent ophthalmology visit - Foot exam completed today with sensation and pulses intact; left foot metatarsal bossing and Achilles tendon insertion pain; completed form for diabetic shoes

## 2015-03-11 ENCOUNTER — Encounter: Payer: Self-pay | Admitting: Family Medicine

## 2015-03-11 LAB — CBC WITH DIFFERENTIAL/PLATELET
Basophils Absolute: 0 10*3/uL (ref 0.0–0.1)
Basophils Relative: 0 % (ref 0–1)
EOS ABS: 0.1 10*3/uL (ref 0.0–0.7)
EOS PCT: 2 % (ref 0–5)
HCT: 40.3 % (ref 36.0–46.0)
Hemoglobin: 13.2 g/dL (ref 12.0–15.0)
Lymphocytes Relative: 54 % — ABNORMAL HIGH (ref 12–46)
Lymphs Abs: 3.9 10*3/uL (ref 0.7–4.0)
MCH: 28.2 pg (ref 26.0–34.0)
MCHC: 32.8 g/dL (ref 30.0–36.0)
MCV: 86.1 fL (ref 78.0–100.0)
MONO ABS: 0.5 10*3/uL (ref 0.1–1.0)
MPV: 12.6 fL — AB (ref 8.6–12.4)
Monocytes Relative: 7 % (ref 3–12)
Neutro Abs: 2.7 10*3/uL (ref 1.7–7.7)
Neutrophils Relative %: 37 % — ABNORMAL LOW (ref 43–77)
PLATELETS: 283 10*3/uL (ref 150–400)
RBC: 4.68 MIL/uL (ref 3.87–5.11)
RDW: 14.2 % (ref 11.5–15.5)
WBC: 7.2 10*3/uL (ref 4.0–10.5)

## 2015-03-11 LAB — COMPREHENSIVE METABOLIC PANEL
ALBUMIN: 4.7 g/dL (ref 3.6–5.1)
ALK PHOS: 91 U/L (ref 33–130)
ALT: 23 U/L (ref 6–29)
AST: 21 U/L (ref 10–35)
BILIRUBIN TOTAL: 1.2 mg/dL (ref 0.2–1.2)
BUN: 10 mg/dL (ref 7–25)
CO2: 27 mmol/L (ref 20–31)
CREATININE: 0.61 mg/dL (ref 0.50–0.99)
Calcium: 10.1 mg/dL (ref 8.6–10.4)
Chloride: 98 mmol/L (ref 98–110)
Glucose, Bld: 158 mg/dL — ABNORMAL HIGH (ref 65–99)
Potassium: 3.6 mmol/L (ref 3.5–5.3)
Sodium: 134 mmol/L — ABNORMAL LOW (ref 135–146)
Total Protein: 7.2 g/dL (ref 6.1–8.1)

## 2015-03-11 LAB — MICROALBUMIN, URINE: Microalb, Ur: 3 mg/dL — ABNORMAL HIGH (ref <2.0)

## 2015-03-11 LAB — LDL CHOLESTEROL, DIRECT: Direct LDL: 106 mg/dL (ref <130)

## 2015-04-07 ENCOUNTER — Telehealth: Payer: Self-pay | Admitting: Family Medicine

## 2015-04-07 NOTE — Telephone Encounter (Signed)
Patient requests Notes from September 7 appointment about the issue for diabetes and her feet. Patient asks notes to be fax to [facility] fax number [phone]. Please, follow up.

## 2015-04-07 NOTE — Telephone Encounter (Signed)
Notes printed and faxed to [employer]. Rocklin Soderquist,CMA

## 2015-04-15 ENCOUNTER — Other Ambulatory Visit: Payer: Self-pay | Admitting: Family Medicine

## 2015-04-23 ENCOUNTER — Telehealth: Payer: Self-pay | Admitting: *Deleted

## 2015-04-23 DIAGNOSIS — E11618 Type 2 diabetes mellitus with other diabetic arthropathy: Secondary | ICD-10-CM

## 2015-04-23 MED ORDER — METFORMIN HCL 1000 MG PO TABS: 1000.0000 mg | ORAL_TABLET | Freq: Two times a day (BID) | ORAL | Status: DC

## 2015-04-23 NOTE — Telephone Encounter (Signed)
Received a fax from [place] stating Dr. Milagros Reap state license in not active.  Patient need refill on Metformin.  Dr. Berkley Harvey and Dr. Valentina Lucks are aware of the issue.  Precept with Dr. Ree Kida ok to send refill under his name.  Derl Barrow, RN

## 2015-07-22 ENCOUNTER — Emergency Department (HOSPITAL_COMMUNITY): Payer: PPO

## 2015-07-22 ENCOUNTER — Emergency Department (HOSPITAL_COMMUNITY)
Admission: EM | Admit: 2015-07-22 | Discharge: 2015-07-22 | Disposition: A | Discharge status: Home / Self Care | Payer: PPO | Attending: Emergency Medicine | Admitting: Emergency Medicine

## 2015-07-22 ENCOUNTER — Encounter (HOSPITAL_COMMUNITY): Payer: Self-pay | Admitting: Emergency Medicine

## 2015-07-22 DIAGNOSIS — E119 Type 2 diabetes mellitus without complications: Secondary | ICD-10-CM | POA: Diagnosis not present

## 2015-07-22 DIAGNOSIS — I10 Essential (primary) hypertension: Secondary | ICD-10-CM | POA: Diagnosis not present

## 2015-07-22 DIAGNOSIS — Z853 Personal history of malignant neoplasm of breast: Secondary | ICD-10-CM | POA: Diagnosis not present

## 2015-07-22 DIAGNOSIS — Z7982 Long term (current) use of aspirin: Secondary | ICD-10-CM | POA: Insufficient documentation

## 2015-07-22 DIAGNOSIS — M25511 Pain in right shoulder: Secondary | ICD-10-CM | POA: Insufficient documentation

## 2015-07-22 DIAGNOSIS — E785 Hyperlipidemia, unspecified: Secondary | ICD-10-CM | POA: Insufficient documentation

## 2015-07-22 DIAGNOSIS — Z7984 Long term (current) use of oral hypoglycemic drugs: Secondary | ICD-10-CM | POA: Insufficient documentation

## 2015-07-22 DIAGNOSIS — M7918 Myalgia, other site: Secondary | ICD-10-CM

## 2015-07-22 LAB — URINALYSIS, ROUTINE W REFLEX MICROSCOPIC
Bilirubin Urine: NEGATIVE
Glucose, UA: NEGATIVE mg/dL
HGB URINE DIPSTICK: NEGATIVE
Ketones, ur: NEGATIVE mg/dL
Nitrite: NEGATIVE
PH: 5.5 (ref 5.0–8.0)
Protein, ur: NEGATIVE mg/dL
SPECIFIC GRAVITY, URINE: 1.023 (ref 1.005–1.030)

## 2015-07-22 LAB — COMPREHENSIVE METABOLIC PANEL
ALBUMIN: 4 g/dL (ref 3.5–5.0)
ALT: 22 U/L (ref 14–54)
ANION GAP: 9 (ref 5–15)
AST: 23 U/L (ref 15–41)
Alkaline Phosphatase: 91 U/L (ref 38–126)
BILIRUBIN TOTAL: 1.2 mg/dL (ref 0.3–1.2)
BUN: 9 mg/dL (ref 6–20)
CO2: 26 mmol/L (ref 22–32)
Calcium: 9.4 mg/dL (ref 8.9–10.3)
Chloride: 104 mmol/L (ref 101–111)
Creatinine, Ser: 0.78 mg/dL (ref 0.44–1.00)
GLUCOSE: 141 mg/dL — AB (ref 65–99)
Potassium: 3.8 mmol/L (ref 3.5–5.1)
Sodium: 139 mmol/L (ref 135–145)
TOTAL PROTEIN: 7.2 g/dL (ref 6.5–8.1)

## 2015-07-22 LAB — CBC
HCT: 37.4 % (ref 36.0–46.0)
HEMOGLOBIN: 11.9 g/dL — AB (ref 12.0–15.0)
MCH: 28.3 pg (ref 26.0–34.0)
MCHC: 31.8 g/dL (ref 30.0–36.0)
MCV: 88.8 fL (ref 78.0–100.0)
Platelets: 267 10*3/uL (ref 150–400)
RBC: 4.21 MIL/uL (ref 3.87–5.11)
RDW: 13.4 % (ref 11.5–15.5)
WBC: 6.1 10*3/uL (ref 4.0–10.5)

## 2015-07-22 LAB — I-STAT TROPONIN, ED: TROPONIN I, POC: 0.02 ng/mL (ref 0.00–0.08)

## 2015-07-22 LAB — URINE MICROSCOPIC-ADD ON

## 2015-07-22 MED ORDER — HYDROCHLOROTHIAZIDE 25 MG PO TABS
25.0000 mg | ORAL_TABLET | Freq: Once | ORAL | Status: AC
  Administered 2015-07-22: 25 mg via ORAL
  Filled 2015-07-22: qty 1

## 2015-07-22 MED ORDER — METOPROLOL TARTRATE 25 MG PO TABS
50.0000 mg | ORAL_TABLET | Freq: Once | ORAL | Status: AC
  Administered 2015-07-22: 50 mg via ORAL
  Filled 2015-07-22: qty 2

## 2015-07-22 MED ORDER — LISINOPRIL 20 MG PO TABS
40.0000 mg | ORAL_TABLET | Freq: Once | ORAL | Status: AC
  Administered 2015-07-22: 40 mg via ORAL
  Filled 2015-07-22: qty 2

## 2015-07-22 MED ORDER — NAPROXEN 250 MG PO TABS
500.0000 mg | ORAL_TABLET | Freq: Once | ORAL | Status: AC
  Administered 2015-07-22: 500 mg via ORAL
  Filled 2015-07-22: qty 2

## 2015-07-22 MED ORDER — HYDROCODONE-ACETAMINOPHEN 5-325 MG PO TABS
1.0000 | ORAL_TABLET | Freq: Once | ORAL | Status: AC
  Administered 2015-07-22: 1 via ORAL
  Filled 2015-07-22: qty 1

## 2015-07-22 NOTE — ED Provider Notes (Signed)
CSN: [id number]     Arrival date & time 07/22/15  [id number] History   First MD Initiated Contact with Patient 07/22/15 [phone]     Chief Complaint  Patient presents with  . Shoulder Pain     (Consider location/radiation/quality/duration/timing/severity/associated sxs/prior Treatment) HPI 65 year old female who presents with right shoulder pain, now for several days, progressively worse. States several year history of this right shoulder pain, that has been worsening over the past week. History of HTN, DM, HLD, and breast cancer. Reports she has had aches and pains in her right shoulder before, but never had it evaluated. Taking ibuprofen, initially improved. 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 No trauma or heavy lifting recently. No known inciting factors.   No nausea, vomiting, shortness or breath, chest pain, lightheaded, syncope, diaphoresis, abdominal pain. Not worse with exertional activity.    Past Medical History  Diagnosis Date  . Hypertension   . Diabetes mellitus   . Hyperlipidemia   . Cancer ([place]) 1991    Breast   Past Surgical History  Procedure Laterality Date  . Cesarean section  1968  . Breast surgery  1991    Lumpectomy   Family History  Problem Relation Age of Onset  . Diabetes Mother   . Hypertension Mother   . COPD Mother   . Hypertension Father   . Heart disease Sister   . Hypertension Brother   . Diabetes Daughter    Social History  Substance Use Topics  . Smoking status: Former Smoker -- 0.50 packs/day for 25 years    Quit date: 12/18/1982  . Smokeless tobacco: None  . Alcohol Use: No   OB History    No data available     Review of Systems 10/14 systems reviewed and are negative other than those stated in the HPI    Allergies  Review of patient's allergies indicates no known allergies.  Home Medications   Prior to Admission medications   Medication Sig Start Date End Date Taking? Authorizing Provider   acetaminophen (TYLENOL) 500 MG tablet Take 1,000 mg by mouth every 6 (six) hours as needed for mild pain.    Historical Provider, MD  aspirin 81 MG tablet Take 81 mg by mouth daily.    Historical Provider, MD  atorvastatin (LIPITOR) 40 MG tablet Take 1 tablet (40 mg total) by mouth daily. 03/10/15   Olam Idler, MD  HYDROcodone-acetaminophen (NORCO/VICODIN) 5-325 MG per tablet Take 1-2 tablets by mouth every 4 (four) hours as needed for moderate pain or severe pain. Patient not taking: Reported on 03/10/2015 11/29/14   Charlesetta Shanks, MD  ibuprofen (ADVIL,MOTRIN) 200 MG tablet Take 200 mg by mouth every 4 (four) hours as needed for mild pain.    Historical Provider, MD  lisinopril-hydrochlorothiazide Reita May) 20-12.5 MG tablet take 2 tablets by mouth twice a day 04/16/15   Olam Idler, MD  metFORMIN (GLUCOPHAGE) 1000 MG tablet Take 1 tablet (1,000 mg total) by mouth 2 (two) times daily with a meal. 04/23/15   Lupita Dawn, MD  metoprolol (LOPRESSOR) 50 MG tablet take 1 tablet by mouth twice a day 04/16/15   Olam Idler, MD  sulfacetamide (BLEPH-10) 10 % ophthalmic solution Place 1-2 drops into the left eye every 4 (four) hours. 11/29/14   Charlesetta Shanks, MD   BP 161/80 mmHg  Pulse 65  Temp(Src) 97.8 F (36.6 C) (Oral)  Resp 15  SpO2 99% Physical Exam Physical Exam  Nursing note and vitals reviewed. Constitutional: Well developed, well nourished, non-toxic, and in no acute distress Head: Normocephalic and atraumatic.  Mouth/Throat: Oropharynx is clear and moist.  Neck: Normal range of motion. Neck supple.  Cardiovascular: Normal rate and regular rhythm.   Pulmonary/Chest: Effort normal and breath sounds normal.  Abdominal: Soft. There is no tenderness. There is no rebound and no guarding.  Musculoskeletal: Tenderness to palpation with the superior anterior portion of the right shoulder. Pain reproduced with range of motion of the right shoulder, primarily with abduction  and extension. No appreciable deformities.  Neurological: Alert, no facial droop, fluent speech, moves all extremities symmetrically, intact enervation involving the axillary, median, ulnar, and radial nerves of the right upper extremity. Skin: Skin is warm and dry.  Psychiatric: Cooperative  ED Course  Procedures (including critical care time) Labs Review Labs Reviewed  COMPREHENSIVE METABOLIC PANEL - Abnormal; Notable for the following:    Glucose, Bld 141 (*)    All other components within normal limits  CBC - Abnormal; Notable for the following:    Hemoglobin 11.9 (*)    All other components within normal limits  URINALYSIS, ROUTINE W REFLEX MICROSCOPIC (NOT AT [facility]) - Abnormal; Notable for the following:    APPearance HAZY (*)    Leukocytes, UA SMALL (*)    All other components within normal limits  URINE MICROSCOPIC-ADD ON - Abnormal; Notable for the following:    Squamous Epithelial / LPF 6-30 (*)    Bacteria, UA FEW (*)    All other components within normal limits  I-STAT TROPOININ, ED    Imaging Review Dg Shoulder Right  07/22/2015  CLINICAL DATA:  All around RIGHT shoulder pain off and on for a long time increasing recently, felt and heard a pop this morning now with increased pain, initial encounter EXAM: RIGHT SHOULDER - 2+ VIEW COMPARISON:  None FINDINGS: AC joint alignment grossly normal. Bones appear demineralized. No definite fracture, dislocation or bone destruction. Visualized RIGHT ribs appear intact. IMPRESSION: No acute abnormalities. Electronically Signed   By: Lavonia Chosen Garron M.D.   On: 07/22/2015 11:03   I have personally reviewed and evaluated these images and lab results as part of my medical decision-making.   EKG Interpretation   Date/Time:  Thursday July 22 2015 10:19:10 EST Ventricular Rate:  69 PR Interval:  151 QRS Duration: 113 QT Interval:  464 QTC Calculation: 497 R Axis:   -35 Text Interpretation:  Sinus rhythm Incomplete RBBB and LAFB  Abnormal  R-wave progression, late transition Left ventricular hypertrophy  Borderline prolonged QT interval No prior EKG for comparison Confirmed by  Tinzlee Craker MD, [place] Wenk [id number]) on 07/22/2015 10:24:22 AM      MDM   Final diagnoses:  Shoulder pain, right  Musculoskeletal pain    65 year old female with history of long-standing right shoulder pain, now presenting with worsening pain over one week. Given her age and cardiovascular risk factors, EKG and troponin was obtained. EKG is not acutely ischemic, and troponin is negative. No concern for underlying ACS. Her pain is fully reproducible with range of motion of her right shoulder as well as with palpation of the anterior and superior shoulder. Extremities neurovascularly intact. X-ray of her shoulders overall unremarkable. Suspect that she may have soft tissue injury or ligamentous injury. Discussed further supportive care with this patient for home, and given referral to sports medicine/orthopedic surgery as needed. Strict return and follow-up instructions reviewed. She expressed understanding of all discharge instructions and felt comfortable with the  plan of care.   Forde Dandy, MD 07/22/15 1141

## 2015-07-22 NOTE — ED Notes (Addendum)
Pt sts right shoulder pain this morning and felt a "pop"; pt sts hx of similar; pt noted to be hypertensive but sts did not take her meds

## 2015-07-22 NOTE — Discharge Instructions (Signed)
Continue with OTC medications for pain control. Apply heat or ice to area as needed. Follow-up with PCP and/or orthopedic surgery to discuss need for MRI as well as further management of your pain.  Shoulder Pain The shoulder is the joint that connects your arms to your body. The bones that form the shoulder joint include the upper arm bone (humerus), the shoulder blade (scapula), and the collarbone (clavicle). The top of the humerus is shaped like a ball and fits into a rather flat socket on the scapula (glenoid cavity). A combination of muscles and strong, fibrous tissues that connect muscles to bones (tendons) support your shoulder joint and hold the ball in the socket. Small, fluid-filled sacs (bursae) are located in different areas of the joint. They act as cushions between the bones and the overlying soft tissues and help reduce friction between the gliding tendons and the bone as you move your arm. Your shoulder joint allows a wide range of motion in your arm. This range of motion allows you to do things like scratch your back or throw a ball. However, this range of motion also makes your shoulder more prone to pain from overuse and injury. Causes of shoulder pain can originate from both injury and overuse and usually can be grouped in the following four categories: 1. Redness, swelling, and pain (inflammation) of the tendon (tendinitis) or the bursae (bursitis). 2. Instability, such as a dislocation of the joint. 3. Inflammation of the joint (arthritis). 4. Broken bone (fracture). HOME CARE INSTRUCTIONS  1. Apply ice to the sore area.  Put ice in a plastic bag.  Place a towel between your skin and the bag.  Leave the ice on for 15-20 minutes, 3-4 times per day for the first 2 days, or as directed by your health care provider. 2. Stop using cold packs if they do not help with the pain. 3. If you have a shoulder sling or immobilizer, wear it as long as your caregiver instructs. Only remove  it to shower or bathe. Move your arm as little as possible, but keep your hand moving to prevent swelling. 4. Squeeze a soft ball or foam pad as much as possible to help prevent swelling. 5. Only take over-the-counter or prescription medicines for pain, discomfort, or fever as directed by your caregiver. SEEK MEDICAL CARE IF:  1. Your shoulder pain increases, or new pain develops in your arm, hand, or fingers. 2. Your hand or fingers become cold and numb. 3. Your pain is not relieved with medicines. SEEK IMMEDIATE MEDICAL CARE IF:  1. Your arm, hand, or fingers are numb or tingling. 2. Your arm, hand, or fingers are significantly swollen or turn white or blue. MAKE SURE YOU:  1. Understand these instructions. 2. Will watch your condition. 3. Will get help right away if you are not doing well or get worse.   This information is not intended to replace advice given to you by your health care provider. Make sure you discuss any questions you have with your health care provider.   Document Released: 03/29/2005 Document Revised: 07/10/2014 Document Reviewed: 10/12/2014 Elsevier Interactive Patient Education 2016 [employer]  Shoulder Range of Motion Exercises Shoulder range of motion (ROM) exercises are designed to keep the shoulder moving freely. They are often recommended for people who have shoulder pain. MOVEMENT EXERCISE When you are able, do this exercise 5-6 days per week, or as told by your health care provider. Work toward doing 2 sets of 10 swings. Pendulum  Exercise How To Do This Exercise Lying Down 5. Lie face-down on a bed with your abdomen close to the side of the bed. 6. Let your arm hang over the side of the bed. 7. Relax your shoulder, arm, and hand. 8. Slowly and gently swing your arm forward and back. Do not use your neck muscles to swing your arm. They should be relaxed. If you are struggling to swing your arm, have someone gently swing it for you. When you do this  exercise for the first time, swing your arm at a 15 degree angle for 15 seconds, or swing your arm 10 times. As pain lessens over time, increase the angle of the swing to 30-45 degrees. 9. Repeat steps 1-4 with the other arm. How To Do This Exercise While Standing 6. Stand next to a sturdy chair or table and hold on to it with your hand.  Bend forward at the waist.  Bend your knees slightly.  Relax your other arm and let it hang limp.  Relax the shoulder blade of the arm that is hanging and let it drop.  While keeping your shoulder relaxed, use body motion to swing your arm in small circles. The first time you do this exercise, swing your arm for about 30 seconds or 10 times. When you do it next time, swing your arm for a little longer.  Stand up tall and relax.  Repeat steps 1-7, this time changing the direction of the circles. 7. Repeat steps 1-8 with the other arm. STRETCHING EXERCISES Do these exercises 3-4 times per day on 5-6 days per week or as told by your health care provider. Work toward holding the stretch for 20 seconds. Stretching Exercise 1 4. Lift your arm straight out in front of you. 5. Bend your arm 90 degrees at the elbow (right angle) so your forearm goes across your body and looks like the letter "L." 6. Use your other arm to gently pull the elbow forward and across your body. 7. Repeat steps 1-3 with the other arm. Stretching Exercise 2 You will need a towel or rope for this exercise. 3. Bend one arm behind your back with the palm facing outward. 4. Hold a towel with your other hand. 5. Reach the arm that holds the towel above your head, and bend that arm at the elbow. Your wrist should be behind your neck. 6. Use your free hand to grab the free end of the towel. 7. With the higher hand, gently pull the towel up behind you. 8. With the lower hand, pull the towel down behind you. 9. Repeat steps 1-6 with the other arm. STRENGTHENING EXERCISES Do each of these  exercises at four different times of day (sessions) every day or as told by your health care provider. To begin with, repeat each exercise 5 times (repetitions). Work toward doing 3 sets of 12 repetitions or as told by your health care provider. Strengthening Exercise 1 You will need a light weight for this activity. As you grow stronger, you may use a heavier weight. 4. Standing with a weight in your hand, lift your arm straight out to the side until it is at the same height as your shoulder. 5. Bend your arm at 90 degrees so that your fingers are pointing to the ceiling. 6. Slowly raise your hand until your arm is straight up in the air. 7. Repeat steps 1-3 with the other arm. Strengthening Exercise 2 You will need a light weight for  this activity. As you grow stronger, you may use a heavier weight. 1. Standing with a weight in your hand, gradually move your straight arm in an arc, starting at your side, then out in front of you, then straight up over your head. 2. Gradually move your other arm in an arc, starting at your side, then out in front of you, then straight up over your head. 3. Repeat steps 1-2 with the other arm. Strengthening Exercise 3 You will need an elastic band for this activity. As you grow stronger, gradually increase the size of the bands or increase the number of bands that you use at one time. 1. While standing, hold an elastic band in one hand and raise that arm up in the air. 2. With your other hand, pull down the band until that hand is by your side. 3. Repeat steps 1-2 with the other arm.   This information is not intended to replace advice given to you by your health care provider. Make sure you discuss any questions you have with your health care provider.   Document Released: 03/18/2003 Document Revised: 11/03/2014 Document Reviewed: 06/15/2014 Elsevier Interactive Patient Education [employer].

## 2015-08-02 ENCOUNTER — Other Ambulatory Visit: Payer: Self-pay | Admitting: Family Medicine

## 2015-08-02 NOTE — Telephone Encounter (Signed)
Needs refill on lisonpril/hctz  [facility] on [address]

## 2015-08-04 MED ORDER — LISINOPRIL-HYDROCHLOROTHIAZIDE 20-12.5 MG PO TABS: 2.0000 | ORAL_TABLET | Freq: Two times a day (BID) | ORAL | Status: DC

## 2015-08-04 NOTE — Telephone Encounter (Signed)
Please advised patient they need primary care visit prior to additional refills.

## 2015-08-04 NOTE — Telephone Encounter (Signed)
LM for patient to call back.  Please assist her in making an appt with pcp for bp follow up at his next available time. Jazmin Hartsell,CMA

## 2015-08-06 ENCOUNTER — Encounter: Payer: Self-pay | Admitting: Family Medicine

## 2015-08-06 ENCOUNTER — Ambulatory Visit (INDEPENDENT_AMBULATORY_CARE_PROVIDER_SITE_OTHER): Payer: PPO | Admitting: Family Medicine

## 2015-08-06 VITALS — BP 233/101 | HR 71 | Temp 97.7°F | Wt 178.4 lb

## 2015-08-06 DIAGNOSIS — E11618 Type 2 diabetes mellitus with other diabetic arthropathy: Secondary | ICD-10-CM

## 2015-08-06 DIAGNOSIS — I10 Essential (primary) hypertension: Secondary | ICD-10-CM

## 2015-08-06 DIAGNOSIS — E785 Hyperlipidemia, unspecified: Secondary | ICD-10-CM

## 2015-08-06 LAB — POCT GLYCOSYLATED HEMOGLOBIN (HGB A1C): Hemoglobin A1C: 7.1

## 2015-08-06 MED ORDER — LISINOPRIL-HYDROCHLOROTHIAZIDE 20-12.5 MG PO TABS: 2.0000 | ORAL_TABLET | Freq: Every day | ORAL | Status: DC

## 2015-08-06 NOTE — Progress Notes (Signed)
  Patient name: Ashley Sloan MRN [id number]  Date of birth: 1950/10/29  CC & HPI:  Ashley Sloan is a 65 y.o. female presenting today for HTN, DM, HLD.   DIABETES  Lab Results  Component Value Date   HGBA1C 7.1 08/06/2015    Blood Sugar Ranges: Not check  Symptoms of Hypoglycemia? No  Comorbid Symptoms:  no Neuropathy: Vision problems: No  Medication Side Effects: Some fullness x 2-3 days, No N/V  CHRONIC HYPERTENSION - Has been out of lisinopril and hydrochlorothiazide for one month  BP Readings from Last 3 Encounters:  08/06/15 233/101  07/22/15 159/73  03/10/15 172/88    Disease Monitoring  Blood pressure range outside clinc: not checking  Chest pain: no   Dyspnea: yes, when she walks for the past few days   Claudication: no  Medication Side Effects: No dizziness, LE edema, chest pain  HLD Lab Results  Component Value Date   LDLCALC 130* 12/12/2012    Medication Compliance: yes  Side Effects?: No muscle pain or weakness, RUQ pain; jaundice  Medication compliance no, ran out of BP meds x 1 month and didn't call for refill   Preventitive Healthcare:  Smoking: former    ROS: See HPI   Medical & Surgical Hx:  Reviewed  Medications & Allergies: Reviewed  Social History: Reviewed:   Objective Findings:  Vitals: BP 233/101 mmHg  Pulse 71  Temp(Src) 97.7 F (36.5 C) (Oral)  Wt 178 lb 6.4 oz (80.922 kg)  Gen: NAD CV: RRR w/o m/r/g, pulses +2 b/l Resp: CTAB w/ normal respiratory effort Lower Ext: No skin changes; No edema; distal pulses intact; Calves nontender  Assessment & Plan:   Diabetes mellitus, type 2 Blood sugars well controlled. No Hypoglycemia - continue metformin 1000 mg twice a day - Follow-up in 3 months  HTN (hypertension), benign Blood pressure extremely elevated today. Has been out of lisinopril and hydrochlorothiazide for one month. Also with a recent bronchitis - Restart lisinopril/hydrochlorothiazide 40-25mg  - Follow-up in one  week for nurse blood pressure check - Follow-up in 2 weeks with PCP for hypertension - Consider echo if continues to complain of dyspnea once blood pressure improved. Currently without lower extremity edema, PND, orthopnea  HLD (hyperlipidemia) Reports compliance with Lipitor 40 mg at night - Recheck lipid panel at next visit in 2 weeks

## 2015-08-06 NOTE — Assessment & Plan Note (Signed)
Reports compliance with Lipitor 40 mg at night - Recheck lipid panel at next visit in 2 weeks

## 2015-08-06 NOTE — Assessment & Plan Note (Signed)
Blood pressure extremely elevated today. Has been out of lisinopril and hydrochlorothiazide for one month. Also with a recent bronchitis - Restart lisinopril/hydrochlorothiazide 40-25mg  - Follow-up in one week for nurse blood pressure check - Follow-up in 2 weeks with PCP for hypertension - Consider echo if continues to complain of dyspnea once blood pressure improved. Currently without lower extremity edema, PND, orthopnea

## 2015-08-06 NOTE — Patient Instructions (Signed)
It was great seeing you today.   1. Restart your lisinopril-hydrochlorothiazide 20/12.5 ( 2 pills ) once a day.    Please bring all your medications to every doctors visit  Sign up for My Chart to have easy access to your labs results, and communication with your Primary care physician.  Next Appointment  Please make an appointment with Dr Berkley Harvey in 2 weeks for blood pressure  Make Nurse blood pressure check in 1 week.    I look forward to talking with you again at our next visit. If you have any questions or concerns before then, please call the clinic at [phone].  Take Care,   Dr Phill Myron

## 2015-08-06 NOTE — Assessment & Plan Note (Signed)
Blood sugars well controlled. No Hypoglycemia - continue metformin 1000 mg twice a day - Follow-up in 3 months

## 2015-08-13 ENCOUNTER — Ambulatory Visit (INDEPENDENT_AMBULATORY_CARE_PROVIDER_SITE_OTHER): Payer: PPO | Admitting: *Deleted

## 2015-08-13 ENCOUNTER — Ambulatory Visit (INDEPENDENT_AMBULATORY_CARE_PROVIDER_SITE_OTHER): Payer: PPO | Admitting: Family Medicine

## 2015-08-13 ENCOUNTER — Encounter: Payer: Self-pay | Admitting: Family Medicine

## 2015-08-13 VITALS — BP 190/92 | HR 77

## 2015-08-13 DIAGNOSIS — Z0130 Encounter for examination of blood pressure without abnormal findings: Secondary | ICD-10-CM

## 2015-08-13 DIAGNOSIS — I10 Essential (primary) hypertension: Secondary | ICD-10-CM

## 2015-08-13 DIAGNOSIS — Z136 Encounter for screening for cardiovascular disorders: Secondary | ICD-10-CM

## 2015-08-13 MED ORDER — AMLODIPINE BESYLATE 5 MG PO TABS: 5.0000 mg | ORAL_TABLET | Freq: Every day | ORAL | Status: DC

## 2015-08-13 NOTE — Assessment & Plan Note (Signed)
Blood pressure remains elevated today and reports elevated home BP readings.  - Continue metoprolol 50 mg daily, lisinopril-hydrochlorothiazide 40/25 mg daily - Start amlodipine 5 mg daily at bedtime - Advised to make apt with Dr Valentina Lucks next Thursday for ambulatory BP monitoring

## 2015-08-13 NOTE — Progress Notes (Signed)
   Subjective:    Patient ID: Ashley Sloan, female    DOB: 11/19/1950, 65 y.o.   MRN: [id number]  Seen for Same day visit for   CC: high BP  She comes in today for nurse blood pressure check and her blood pressure remains elevated. She reports her shortness of breath that she was experiencing last time has resolved as well as her upper respiratory symptoms. She denies any chest pain, palpitations, lower extremity swelling. She reports good compliance with restarting lisinopril/hydrochlorothiazide as well as continued on metoprolol.   Review of Systems   See HPI for ROS. Objective:  BP 190/92 mmHg  Temp(Src) 97.5 F (36.4 C) (Oral)  Wt 165 lb 12.8 oz (75.206 kg)  General: NAD Cardiac: RRR, normal heart sounds, no murmurs.  Respiratory: CTAB, normal effort    Assessment & Plan:   HTN (hypertension), benign Blood pressure remains elevated today and reports elevated home BP readings.  - Continue metoprolol 50 mg daily, lisinopril-hydrochlorothiazide 40/25 mg daily - Start amlodipine 5 mg daily at bedtime - Advised to make apt with Dr Valentina Lucks next Thursday for ambulatory BP monitoring

## 2015-08-13 NOTE — Progress Notes (Signed)
   Patient in nurse clinic for blood pressure check.  Patient denies any chest pain, SOB, dizziness, headache, numbness/tingling of extremities, n/v but some abdominal pain.  Abdominal pain x 1 week.  Per patient she took some pepto bismol that help with some relief.  Patient stated she did take her blood pressure medication this morning.  Patient was added to PCP schedule this morning.  Will forward to PCP.  Derl Barrow, RN   Today's Vitals   08/13/15 1109  BP: 190/92  Pulse: 77  SpO2: 97%  PainSc: 0-No pain

## 2015-08-13 NOTE — Patient Instructions (Addendum)
It was great seeing you today.   1. Start Norvasc 5mg  (1 pill) every night.  2. Check your blood pressure 2-3 times a day. Write them down and bring to your next appointment.    Please bring all your medications to every doctors visit  Sign up for My Chart to have easy access to your labs results, and communication with your Primary care physician.  If you have any questions or concerns before then, please call the clinic at [phone].  Take Care,   Dr Phill Myron

## 2015-08-16 ENCOUNTER — Ambulatory Visit: Payer: PPO | Admitting: Family Medicine

## 2015-08-17 ENCOUNTER — Emergency Department (HOSPITAL_COMMUNITY): Payer: PPO

## 2015-08-17 ENCOUNTER — Encounter (HOSPITAL_COMMUNITY): Payer: Self-pay | Admitting: Neurology

## 2015-08-17 ENCOUNTER — Emergency Department (HOSPITAL_COMMUNITY)
Admission: EM | Admit: 2015-08-17 | Discharge: 2015-08-17 | Disposition: A | Discharge status: Home / Self Care | Payer: PPO | Attending: Emergency Medicine | Admitting: Emergency Medicine

## 2015-08-17 DIAGNOSIS — Z853 Personal history of malignant neoplasm of breast: Secondary | ICD-10-CM | POA: Diagnosis not present

## 2015-08-17 DIAGNOSIS — E785 Hyperlipidemia, unspecified: Secondary | ICD-10-CM | POA: Insufficient documentation

## 2015-08-17 DIAGNOSIS — Z7984 Long term (current) use of oral hypoglycemic drugs: Secondary | ICD-10-CM | POA: Insufficient documentation

## 2015-08-17 DIAGNOSIS — R1032 Left lower quadrant pain: Secondary | ICD-10-CM | POA: Diagnosis not present

## 2015-08-17 DIAGNOSIS — R1012 Left upper quadrant pain: Secondary | ICD-10-CM | POA: Insufficient documentation

## 2015-08-17 DIAGNOSIS — Z7982 Long term (current) use of aspirin: Secondary | ICD-10-CM | POA: Diagnosis not present

## 2015-08-17 DIAGNOSIS — E119 Type 2 diabetes mellitus without complications: Secondary | ICD-10-CM | POA: Insufficient documentation

## 2015-08-17 DIAGNOSIS — I10 Essential (primary) hypertension: Secondary | ICD-10-CM | POA: Diagnosis not present

## 2015-08-17 DIAGNOSIS — R1011 Right upper quadrant pain: Secondary | ICD-10-CM | POA: Diagnosis not present

## 2015-08-17 DIAGNOSIS — R1030 Lower abdominal pain, unspecified: Secondary | ICD-10-CM | POA: Diagnosis not present

## 2015-08-17 DIAGNOSIS — Z87891 Personal history of nicotine dependence: Secondary | ICD-10-CM | POA: Insufficient documentation

## 2015-08-17 DIAGNOSIS — R110 Nausea: Secondary | ICD-10-CM | POA: Diagnosis not present

## 2015-08-17 DIAGNOSIS — Z79899 Other long term (current) drug therapy: Secondary | ICD-10-CM | POA: Diagnosis not present

## 2015-08-17 DIAGNOSIS — R109 Unspecified abdominal pain: Secondary | ICD-10-CM | POA: Diagnosis not present

## 2015-08-17 LAB — URINALYSIS, ROUTINE W REFLEX MICROSCOPIC
Bilirubin Urine: NEGATIVE
Glucose, UA: NEGATIVE mg/dL
Hgb urine dipstick: NEGATIVE
Ketones, ur: NEGATIVE mg/dL
NITRITE: NEGATIVE
PH: 5.5 (ref 5.0–8.0)
Protein, ur: NEGATIVE mg/dL
SPECIFIC GRAVITY, URINE: 1.017 (ref 1.005–1.030)

## 2015-08-17 LAB — CBC
HEMATOCRIT: 42.9 % (ref 36.0–46.0)
HEMOGLOBIN: 14.4 g/dL (ref 12.0–15.0)
MCH: 29.1 pg (ref 26.0–34.0)
MCHC: 33.6 g/dL (ref 30.0–36.0)
MCV: 86.8 fL (ref 78.0–100.0)
Platelets: 295 10*3/uL (ref 150–400)
RBC: 4.94 MIL/uL (ref 3.87–5.11)
RDW: 13 % (ref 11.5–15.5)
WBC: 6.4 10*3/uL (ref 4.0–10.5)

## 2015-08-17 LAB — COMPREHENSIVE METABOLIC PANEL
ALT: 18 U/L (ref 14–54)
ANION GAP: 17 — AB (ref 5–15)
AST: 25 U/L (ref 15–41)
Albumin: 3.6 g/dL (ref 3.5–5.0)
Alkaline Phosphatase: 105 U/L (ref 38–126)
BILIRUBIN TOTAL: 1.5 mg/dL — AB (ref 0.3–1.2)
BUN: 17 mg/dL (ref 6–20)
CO2: 23 mmol/L (ref 22–32)
Calcium: 9.9 mg/dL (ref 8.9–10.3)
Chloride: 98 mmol/L — ABNORMAL LOW (ref 101–111)
Creatinine, Ser: 0.99 mg/dL (ref 0.44–1.00)
GFR calc Af Amer: >60 mL/min (ref >60)
GFR, EST NON AFRICAN AMERICAN: 58 mL/min — AB (ref >60)
Glucose, Bld: 148 mg/dL — ABNORMAL HIGH (ref 65–99)
POTASSIUM: 4.2 mmol/L (ref 3.5–5.1)
Sodium: 138 mmol/L (ref 135–145)
TOTAL PROTEIN: 7.3 g/dL (ref 6.5–8.1)

## 2015-08-17 LAB — URINE MICROSCOPIC-ADD ON

## 2015-08-17 LAB — LIPASE, BLOOD: Lipase: 33 U/L (ref 11–51)

## 2015-08-17 MED ORDER — ONDANSETRON HCL 4 MG PO TABS: 4.0000 mg | ORAL_TABLET | Freq: Three times a day (TID) | ORAL | Status: DC | PRN

## 2015-08-17 MED ORDER — ONDANSETRON HCL 4 MG/2ML IJ SOLN: 4.0000 mg | Freq: Once | INTRAMUSCULAR | Status: DC

## 2015-08-17 MED ORDER — ONDANSETRON HCL 4 MG PO TABS
4.0000 mg | ORAL_TABLET | Freq: Once | ORAL | Status: AC
  Administered 2015-08-17: 4 mg via ORAL
  Filled 2015-08-17: qty 1

## 2015-08-17 MED ORDER — DICYCLOMINE HCL 20 MG PO TABS: 20.0000 mg | ORAL_TABLET | Freq: Two times a day (BID) | ORAL | Status: DC | PRN

## 2015-08-17 NOTE — ED Provider Notes (Signed)
CSN: [id number]     Arrival date & time 08/17/15  1470 History   First MD Initiated Contact with Patient 08/17/15 [phone]     Chief Complaint  Patient presents with  . Abdominal Pain  . epigastric pain      (Consider location/radiation/quality/duration/timing/severity/associated sxs/prior Treatment) HPI  Patient reports symptoms of bilateral lower abdominal pain onset >1 week ago, describes acute onset without known trigger or injury, pain is "aching and just hurts" initially 10/10, intermittent episodes of pain lasting range from 5 to 30 min, then pain significantly improves and occurs about 3 to 6 times a day but has a lingering feeling of nausea and "not normal", associated with reduced appetite. Course is stable without worsening. Today decided to come to ED after being woken up from sleep due to the abdominal pain 0200, pain does radiate to her back, lasted for 15-20 min. Did not take any meds for this today. - Initially she tried Pepto-bismol without any relief. Tried Tylenol x 2 tabs without relief, did not try any ibuprofen or Aleve - Taking Metformin without GI upset. Only new med change was Amlodipine added for BP, already on Lisinopril-HCTZ with elevated BP - Recently 2 weeks ago constipated, tried Magnesium Citrate took about half bottle without good response, normally BM every other day, last BM yesterday was loose. Denies firm, hard, dry, painful BMs, denies any blood in stool or dark stools  No prior history of colonoscopy.  Past Medical History  Diagnosis Date  . Hypertension   . Diabetes mellitus   . Hyperlipidemia   . Cancer ([facility]) 1991    Breast   No history of kidney stones.  Past Surgical History  Procedure Laterality Date  . Cesarean section  1968  . Breast surgery  1991    Lumpectomy   No prior abdominal surgeries other than C-section.  Family History  Problem Relation Age of Onset  . Diabetes Mother   . Hypertension Mother   . COPD Mother   .  Hypertension Father   . Heart disease Sister   . Hypertension Brother   . Diabetes Daughter    Social History  Substance Use Topics  . Smoking status: Former Smoker -- 0.50 packs/day for 25 years    Quit date: 12/18/1982  . Smokeless tobacco: None  . Alcohol Use: No   OB History    No data available     Review of Systems  Denies any fevers/chills, cough congestion, SOB, chest pain, arm shoulder or back pain, HA, dysuria, hematuria, flank pain  Allergies  Review of patient's allergies indicates no known allergies.  Home Medications   Prior to Admission medications   Medication Sig Start Date End Date Taking? Authorizing Provider  acetaminophen (TYLENOL) 500 MG tablet Take 1,000 mg by mouth every 6 (six) hours as needed for mild pain.   Yes Historical Provider, MD  amLODipine (NORVASC) 5 MG tablet Take 1 tablet (5 mg total) by mouth daily. 08/13/15  Yes Olam Idler, MD  aspirin 81 MG tablet Take 81 mg by mouth daily.   Yes Historical Provider, MD  atorvastatin (LIPITOR) 40 MG tablet Take 1 tablet (40 mg total) by mouth daily. 03/10/15  Yes Olam Idler, MD  ibuprofen (ADVIL,MOTRIN) 200 MG tablet Take 200 mg by mouth every 4 (four) hours as needed for mild pain.   Yes Historical Provider, MD  lisinopril-hydrochlorothiazide (PRINZIDE,ZESTORETIC) 20-12.5 MG tablet Take 2 tablets by mouth daily. 08/06/15  Yes Olam Idler, MD  metFORMIN (GLUCOPHAGE) 1000 MG tablet Take 1 tablet (1,000 mg total) by mouth 2 (two) times daily with a meal. 04/23/15  Yes Lupita Dawn, MD  metoprolol (LOPRESSOR) 50 MG tablet take 1 tablet by mouth twice a day 04/16/15  Yes Olam Idler, MD  dicyclomine (BENTYL) 20 MG tablet Take 1 tablet (20 mg total) by mouth 3 times/day as needed-between meals & bedtime for spasms. 08/17/15   Olin Hauser, DO  ondansetron (ZOFRAN) 4 MG tablet Take 1 tablet (4 mg total) by mouth every 8 (eight) hours as needed for nausea or vomiting. 08/17/15   Olin Hauser, DO   BP 143/71 mmHg  Pulse 57  Temp(Src) 98.5 F (36.9 C) (Oral)  Resp 12  SpO2 100% Physical Exam  Constitutional: She is oriented to person, place, and time. She appears well-developed and well-nourished. No distress.  Well-appearing, comfortable, conversational  HENT:  Head: Normocephalic and atraumatic.  Mouth/Throat: Oropharynx is clear and moist.  Mild dry mucus mem and tongue  Eyes: Pupils are equal, round, and reactive to light.  Neck: Neck supple. No thyromegaly present.  Cardiovascular: Normal rate, regular rhythm, normal heart sounds and intact distal pulses.   No murmur heard. Pulmonary/Chest: Effort normal and breath sounds normal. No respiratory distress. She has no wheezes. She has no rales.  Abdominal: Soft. Bowel sounds are normal. She exhibits no distension and no mass. There is tenderness (left upper and lower quads only). There is no rebound and no guarding.  Obese. No palpable pulsatile mass over aorta.  Musculoskeletal: Normal range of motion. She exhibits no edema or tenderness.  Back no deformity, non-tender, no spasm to palpation  Neurological: She is alert and oriented to person, place, and time.  Skin: Skin is warm and dry. No rash noted. She is not diaphoretic.  Nursing note and vitals reviewed.   ED Course  Procedures (including critical care time) Labs Review Labs Reviewed  COMPREHENSIVE METABOLIC PANEL - Abnormal; Notable for the following:    Chloride 98 (*)    Glucose, Bld 148 (*)    Total Bilirubin 1.5 (*)    GFR calc non Af Amer 58 (*)    Anion gap 17 (*)    All other components within normal limits  URINALYSIS, ROUTINE W REFLEX MICROSCOPIC (NOT AT [facility]) - Abnormal; Notable for the following:    Leukocytes, UA MODERATE (*)    All other components within normal limits  URINE MICROSCOPIC-ADD ON - Abnormal; Notable for the following:    Squamous Epithelial / LPF 6-30 (*)    Bacteria, UA FEW (*)    All other components within  normal limits  URINE CULTURE  LIPASE, BLOOD  CBC    Imaging Review Dg Abd 1 View  08/17/2015  CLINICAL DATA:  Left-sided abdominal pain. EXAM: ABDOMEN - 1 VIEW COMPARISON:  None. FINDINGS: There is no bowel dilatation to suggest obstruction. There is no evidence of pneumoperitoneum, portal venous gas or pneumatosis. There are no pathologic calcifications along the expected course of the ureters. There is lower lumbar spine spondylosis. IMPRESSION: Negative. Electronically Signed   By: Kathreen Devoid   On: 08/17/2015 09:25   I have personally reviewed and evaluated these images and lab results as part of my medical decision-making.   EKG Interpretation None      MDM   Final diagnoses:  Left sided abdominal pain  Nausea   64 yr AAF with PMH HTN (poorly controlled), DM2, HLD, presents with subacute lower  L>R abdominal pain x 1 week, with persistent intermittent episodes multiple times daily. Some nausea and reduced appetite, otherwise no red flag GI symptoms. History suggestive of possible constipation, with episodes in the past, inadequate treatment at home. Differential is broad, considered potential diverticulitis (L-sided abdomen pain in 38 yr patient however no rectal bleeding, no prior colonoscopy) potential GERD (unrelated to PO, not epigastric and no other symptoms), considered PUD (prior history of NSAID use, none recently >3 weeks, no dark stools), given h/o uncontrolled HTN / DM2 / HLD considered AAA (former smoker, no palpable pulsatile mass).  Suspected abdominal pain from likely chronic constipation. Discussed work-up options with patient. No prior history of CT abdomen. Decision to proceed with KUB to look for stool burden, also will perform bedside US exam to screen for AAA. Due to nausea, provide Zofran 26m PO.  UPDATE 0855 Reviewed lab results with CMET unremarkable (nml electrolytes, glucose 148, Cr 0.99, LFTs and alk phos normal, Lipase 33), CBC normal with WBC 6.4. UA  pending.  UPDATE 1020 Reviewed results of UA with mod leuks, 6-30 WBCs, no RBCs, also 6-30 squams may be contaminate, patient is asymptomatic, will send for urine culture after discussion with patient. Results of Abdominal X-ray KUB is without acute finding, no significant stool burden or bowel obstruction, no unusual gas / air pattern. Decision that CT Abdomen is not indicated at this time. Patient still with some "gas pains" only mildly tender to palpation at this time. Nausea resolved with Zofran. Performed bedside [place] screening, difficult exam but Abd Aorta is small limited measurements but proximally about 1 cm diam, no evidence of aneurysm.  Stable for discharge to home. Will follow-up Urine Cx results. Given rx Zofran PRN nausea and Dicyclomine PRN for potential gas/spasms, as discussed no evidence of constipation but may benefit from Miralax trial OTC for several days to regulate BMs, follow-up with PCP and strongly recommend colonoscopy for eval diverticulosis and rule out colon CA. Return criteria reviewed.   AOlin Hauser DO 08/17/15 1025  JElnora Morrison MD 08/17/15 [phone]

## 2015-08-17 NOTE — ED Notes (Signed)
Pt reports middle/lower abd pain x 1 week. Denies v/d but has felt nauseated. Pain is staying constant.

## 2015-08-17 NOTE — ED Notes (Signed)
EDP at bedside  

## 2015-08-17 NOTE — Discharge Instructions (Signed)
You were evaluated for Left sided abdominal pain. As discussed, this is a vague symptom and can be caused by many different things. Your labs / blood work were unremarkable and did not show any significant problems. Normal kidney, liver, gallbladder. Abdominal x-ray did not show any sign of intestinal blockage, or significant constipation with stool backed up. We did ultrasound of your abdominal aorta artery (it was not enlarged and no aneurysm, this is all normal)  We suspect your abdominal pain may be due to gas, and still could be related to constipation. Also, it could be "Diverticulitis", from stretched areas of your large intestine or colon.  Give two medications, take Zofran as needed every 8 hours for nausea / vomiting. Try to stay well hydrated and eat a regular diet. You may try Dicyclomine (Bentyl) 2 to 3 times a day between meals or night-time for spasms or cramping, if it does not help for a few days then you can stop taking it and it may not help.  You may also try Miralax powder 17g daily for next several days (this is over the counter) or other stool softener for constipation, to see if this helps.  We will call you if Urine Culture is positive and we need to treat you.  We strongly recommend close follow-up with your Primary Doctor at [facility]. Regardless, we recommend that you arrange for a future Colonoscopy test, they will be able to check you for any sign of colon cancer, and evaluate diverticulosis or diverticulitis as well.  Return to ED if significant worsening abdominal pain, persistent nausea, vomiting, rectal bleeding or blood in stool, dark stools, fevers or chills, unable to eat or drink or keep medicine down

## 2015-08-17 NOTE — ED Notes (Signed)
MD at bedside. 

## 2015-08-18 LAB — URINE CULTURE: Culture: 2000

## 2015-08-19 ENCOUNTER — Encounter: Payer: Self-pay | Admitting: Pharmacist

## 2015-08-19 ENCOUNTER — Ambulatory Visit: Payer: PPO | Admitting: Family Medicine

## 2015-08-19 ENCOUNTER — Ambulatory Visit (INDEPENDENT_AMBULATORY_CARE_PROVIDER_SITE_OTHER): Payer: PPO | Admitting: Pharmacist

## 2015-08-19 DIAGNOSIS — I10 Essential (primary) hypertension: Secondary | ICD-10-CM

## 2015-08-19 NOTE — Progress Notes (Signed)
S:    Patient arrives in good spirits, ambulating without assistance.    Presents to the clinic for ambulatory blood pressure evaluation.  Patient was referred on 08/13/2015.  Patient was last seen by Primary Care Provider on 08/13/2015.  Diagnosed with Hypertension in the year of 2013.    Medication compliance is reported to be good.  Discussed procedure for wearing the monitor and gave patient written instructions. Monitor was placed on non-dominant arm with instructions to return in the morning.   Current BP Medications include:  Amlodipine besylate 5 mg QD, lisinopril-hydrochlorothiazide 20-12.5 mg 2 tabs QD, metoprolol tartrate 50 mg BID  Antihypertensives tried in the past include: verapamil 80 mg TID  Patient returned to the clinic and reported blood pressure cuff was ill-fitting and fell down her arm. Pt did not reapply cuff.   Dietary habits include:  O:   Last 3 Office BP readings: BP Readings from Last 3 Encounters:  08/17/15 141/76  08/13/15 190/92  08/13/15 190/92   Patient reported home BP readings: 169/99 147/81 145/70 155/79 147/81 139/71 151/76 142/74 151/76 167/77 2/16 - 122/71  BMET    Component Value Date/Time   NA 138 08/17/2015 0718   K 4.2 08/17/2015 0718   CL 98* 08/17/2015 0718   CO2 23 08/17/2015 0718   GLUCOSE 148* 08/17/2015 0718   BUN 17 08/17/2015 0718   CREATININE 0.99 08/17/2015 0718   CREATININE 0.61 03/10/2015 1557   CALCIUM 9.9 08/17/2015 0718   GFRNONAA 58* 08/17/2015 0718   GFRAA >60 08/17/2015 0718    A/P: Ambulatory Blood Pressure Monitoring attempted - however, unable to complete study. Pt reported cuff was ill-fitting and had slid down her arm. Pt subsequently removed cuff. Downloaded monitor data revealed only one reading. BP still uncontrolled. Given her home readings, increase amlodipine to 10mg  daily and change metoprolol to carvedilol 25mg  BID.    Results reviewed and written information provided.  Total time in  face-to-face counseling 10 minutes.   F/U Rx Clinic Visit in 2 weeks.  Patient seen with Phillis Knack, PharmD Candidate, and Stephens November, PharmD Resident.

## 2015-08-19 NOTE — Patient Instructions (Addendum)
Thank you for coming today!  STOP taking your metoprolol and START taking carvedilol 25mg  twice daily with meals.  Start taking TWO 5mg  amlodipine tablets once daily until gone then start taking ONE 10mg  tablet daily  Bring home blood pressure readings to next visit.  Follow up in pharmacy clinic in 2 weeks.

## 2015-08-20 MED ORDER — CARVEDILOL 25 MG PO TABS: 25.0000 mg | ORAL_TABLET | Freq: Two times a day (BID) | ORAL | Status: DC

## 2015-08-20 MED ORDER — AMLODIPINE BESYLATE 10 MG PO TABS: 10.0000 mg | ORAL_TABLET | Freq: Every day | ORAL | Status: DC

## 2015-08-20 NOTE — Assessment & Plan Note (Signed)
Ambulatory Blood Pressure Monitoring attempted - however, unable to complete study. Pt reported cuff was ill-fitting and had slid down her arm. Pt subsequently removed cuff. Downloaded monitor data revealed only one reading. BP still uncontrolled. Given her home readings, increase amlodipine to 10mg  daily and change metoprolol to carvedilol 25mg  BID.

## 2015-08-20 NOTE — Progress Notes (Signed)
Patient ID: Ashley Sloan, female   DOB: Nov 22, 1950, 65 y.o.   MRN: [id number] Reviewed: Agree with Dr. Graylin Shiver documentation and management.

## 2015-09-02 ENCOUNTER — Encounter: Payer: Self-pay | Admitting: Pharmacist

## 2015-09-02 ENCOUNTER — Ambulatory Visit (INDEPENDENT_AMBULATORY_CARE_PROVIDER_SITE_OTHER): Payer: PPO | Admitting: Pharmacist

## 2015-09-02 VITALS — BP 138/76 | HR 78 | Wt 170.5 lb

## 2015-09-02 DIAGNOSIS — I10 Essential (primary) hypertension: Secondary | ICD-10-CM | POA: Diagnosis not present

## 2015-09-02 NOTE — Progress Notes (Signed)
S:    Patient arrives in good spirits, ambulating without assistance.  Presents to the clinic for blood pressure evaluation.  Patient was referred on 08/13/15.  Patient was last seen by Primary Care Provider on 08/13/15.  Diagnosed with Hypertension in the year of 2013.    Medication compliance is reported to be good.  Current BP Medications include:  Amlodipine 10 mg daily, carvedilol 25 mg BID, and lisinopril-hydrochlorothiazide 40-25 mg daily  Antihypertensives tried in the past include: verapamil, metoprolol   O:   Last 3 Office BP readings: BP Readings from Last 3 Encounters:  08/19/15 137/61  08/17/15 141/76  08/13/15 190/92   Patient reported home BP readings: 122/69 125/67 139/76 136/77 123/67 120/77  BMET    Component Value Date/Time   NA 138 08/17/2015 0718   K 4.2 08/17/2015 0718   CL 98* 08/17/2015 0718   CO2 23 08/17/2015 0718   GLUCOSE 148* 08/17/2015 0718   BUN 17 08/17/2015 0718   CREATININE 0.99 08/17/2015 0718   CREATININE 0.61 03/10/2015 1557   CALCIUM 9.9 08/17/2015 0718   GFRNONAA 58* 08/17/2015 0718   GFRAA >60 08/17/2015 0718    A/P: Hypertension at goal in office today and she has had multiple goal BP levels at home since changes were made at the last appt. Continued current regimen (amlodipine 10mg  daily and carvedilol 25mg  BID).  Results reviewed and written information provided.  Total time in face-to-face counseling 15 minutes.   F/U Clinic Visit with Dr. Berkley Harvey in one month.  Patient seen with Rudolpho Sevin, PharmD Candidate, Nuala Alpha, PharmD Resident, and Elisabeth Most, PharmD Resident.

## 2015-09-02 NOTE — Patient Instructions (Signed)
Thank you for coming in today!   We are not making any changes to your medications today.   Follow up with Dr. Berkley Harvey in one month.

## 2015-09-02 NOTE — Assessment & Plan Note (Signed)
Hypertension at goal in office today and she has had multiple goal BP levels at home since changes were made at the last appt. Continued current regimen (amlodipine 10mg  daily and carvedilol 25mg  BID).

## 2015-09-03 NOTE — Progress Notes (Signed)
Patient ID: Ashley Sloan, female   DOB: 04/29/1951, 65 y.o.   MRN: [id number] Reviewed: Agree with Dr. Graylin Shiver documentation and management.

## 2015-10-27 ENCOUNTER — Other Ambulatory Visit: Payer: Self-pay | Admitting: *Deleted

## 2015-10-27 DIAGNOSIS — I10 Essential (primary) hypertension: Secondary | ICD-10-CM

## 2015-10-28 MED ORDER — LISINOPRIL-HYDROCHLOROTHIAZIDE 20-12.5 MG PO TABS: 2.0000 | ORAL_TABLET | Freq: Every day | ORAL | Status: DC

## 2016-01-19 DIAGNOSIS — H25813 Combined forms of age-related cataract, bilateral: Secondary | ICD-10-CM | POA: Diagnosis not present

## 2016-01-19 DIAGNOSIS — H40053 Ocular hypertension, bilateral: Secondary | ICD-10-CM | POA: Diagnosis not present

## 2016-02-07 DIAGNOSIS — H25012 Cortical age-related cataract, left eye: Secondary | ICD-10-CM | POA: Diagnosis not present

## 2016-02-07 DIAGNOSIS — H2512 Age-related nuclear cataract, left eye: Secondary | ICD-10-CM | POA: Diagnosis not present

## 2016-02-20 ENCOUNTER — Other Ambulatory Visit: Payer: Self-pay | Admitting: Family Medicine

## 2016-02-20 DIAGNOSIS — I10 Essential (primary) hypertension: Secondary | ICD-10-CM

## 2016-02-22 NOTE — Telephone Encounter (Signed)
Rx filled.  Algis Greenhouse. Jerline Pain, [place] Resident PGY-3 02/22/2016 8:40 AM

## 2016-02-22 NOTE — Telephone Encounter (Signed)
Rx filled

## 2016-02-22 NOTE — Telephone Encounter (Signed)
Patient scheduled for 03-17-16. Ashley Sloan,CMA

## 2016-03-17 ENCOUNTER — Encounter: Payer: Self-pay | Admitting: Family Medicine

## 2016-03-17 ENCOUNTER — Ambulatory Visit (INDEPENDENT_AMBULATORY_CARE_PROVIDER_SITE_OTHER): Payer: PPO | Admitting: Family Medicine

## 2016-03-17 VITALS — BP 140/70 | HR 76 | Temp 98.1°F | Ht 61.0 in | Wt 171.0 lb

## 2016-03-17 DIAGNOSIS — H6122 Impacted cerumen, left ear: Secondary | ICD-10-CM

## 2016-03-17 DIAGNOSIS — Z1239 Encounter for other screening for malignant neoplasm of breast: Secondary | ICD-10-CM | POA: Diagnosis not present

## 2016-03-17 DIAGNOSIS — E2839 Other primary ovarian failure: Secondary | ICD-10-CM | POA: Diagnosis not present

## 2016-03-17 DIAGNOSIS — Z1159 Encounter for screening for other viral diseases: Secondary | ICD-10-CM | POA: Diagnosis not present

## 2016-03-17 DIAGNOSIS — E785 Hyperlipidemia, unspecified: Secondary | ICD-10-CM

## 2016-03-17 DIAGNOSIS — I10 Essential (primary) hypertension: Secondary | ICD-10-CM

## 2016-03-17 DIAGNOSIS — Z0000 Encounter for general adult medical examination without abnormal findings: Secondary | ICD-10-CM | POA: Insufficient documentation

## 2016-03-17 DIAGNOSIS — E11618 Type 2 diabetes mellitus with other diabetic arthropathy: Secondary | ICD-10-CM | POA: Diagnosis not present

## 2016-03-17 LAB — POCT GLYCOSYLATED HEMOGLOBIN (HGB A1C): HEMOGLOBIN A1C: 7.3

## 2016-03-17 LAB — BASIC METABOLIC PANEL WITH GFR
BUN: 13 mg/dL (ref 7–25)
CHLORIDE: 101 mmol/L (ref 98–110)
CO2: 26 mmol/L (ref 20–31)
CREATININE: 1.03 mg/dL — AB (ref 0.50–0.99)
Calcium: 9.7 mg/dL (ref 8.6–10.4)
GFR, EST AFRICAN AMERICAN: 65 mL/min (ref >=60)
GFR, Est Non African American: 56 mL/min — ABNORMAL LOW (ref >=60)
Glucose, Bld: 128 mg/dL — ABNORMAL HIGH (ref 65–99)
POTASSIUM: 3.4 mmol/L — AB (ref 3.5–5.3)
SODIUM: 139 mmol/L (ref 135–146)

## 2016-03-17 LAB — LIPID PANEL
CHOLESTEROL: 144 mg/dL (ref 125–200)
HDL: 57 mg/dL (ref >=46)
LDL Cholesterol: 64 mg/dL (ref <130)
TRIGLYCERIDES: 116 mg/dL (ref <150)
Total CHOL/HDL Ratio: 2.5 Ratio (ref <=5.0)
VLDL: 23 mg/dL (ref <30)

## 2016-03-17 LAB — CBC
HCT: 33.7 % — ABNORMAL LOW (ref 35.0–45.0)
Hemoglobin: 11.2 g/dL — ABNORMAL LOW (ref 11.7–15.5)
MCH: 28.7 pg (ref 27.0–33.0)
MCHC: 33.2 g/dL (ref 32.0–36.0)
MCV: 86.4 fL (ref 80.0–100.0)
MPV: 11.8 fL (ref 7.5–12.5)
PLATELETS: 239 10*3/uL (ref 140–400)
RBC: 3.9 MIL/uL (ref 3.80–5.10)
RDW: 13.4 % (ref 11.0–15.0)
WBC: 6.7 10*3/uL (ref 3.8–10.8)

## 2016-03-17 NOTE — Assessment & Plan Note (Signed)
Mammogram and DEXA ordered today. Will check HCV ab. Due for colonoscopy- information given today.

## 2016-03-17 NOTE — Progress Notes (Signed)
    Subjective:  Ashley Sloan is a 65 y.o. female who presents to the [facility] today with a chief complaint of BP follow up.   HPI:  T2DM Currently taking metformin twice a day. Tolerating well without side effects. Has occasional polyuria and polydipsia. No hypoglycemic symptoms. Hypertension BP Readings from Last 3 Encounters:  03/17/16 140/70  09/02/15 138/76  08/19/15 137/61   Home BP monitoring-Yes Compliant with medications-yes, without side effects ROS-Denies any CP, HA, SOB, blurry vision, LE edema, transient weakness, orthopnea, PND.   HLD Tolerating atovastatin without side effects.   Left Ear Hearing Loss Started several months ago. Feels "clogged." No pain. Patient regularly uses qtips.   HCM HCV Footexam  ROS: Per HPI  PMH: Smoking history reviewed.    Objective:  Physical Exam: BP 140/70   Pulse 76   Temp 98.1 F (36.7 C) (Oral)   Ht 5\' 1"  (1.549 m)   Wt 171 lb (77.6 kg)   BMI 32.31 kg/m   Gen: NAD, resting comfortably HEENT: Left TM obscured by cerumen.  CV: RRR with no murmurs appreciated Pulm: NWOB, CTAB with no crackles, wheezes, or rhonchi GI: Normal bowel sounds present. Soft, Nontender, Nondistended. MSK: no edema, cyanosis, or clubbing noted Skin: warm, dry Neuro: grossly normal, moves all extremities Psych: Normal affect and thought content  Results for orders placed or performed in visit on 03/17/16 (from the past 72 hour(s))  HgB A1c     Status: None   Collection Time: 03/17/16  1:33 PM  Result Value Ref Range   Hemoglobin A1C 7.3    Assessment/Plan:  Diabetes mellitus, type 2 (HCC) A1c 7.3 today. Discussed starting additional pharmacologic treatment today, however patient deferred. Patient will continue to work on lifestyle modifications. Said that she would decrease amount of soda that she drinks. Will follow up in 3 months.   HTN (hypertension), benign At goal today. Continue lisinopril-HCTZ, coreg, and amlodipine.   HLD  (hyperlipidemia) Check lipid panel. Continue atorvastatin.   Healthcare maintenance Mammogram and DEXA ordered today. Will check HCV ab. Due for colonoscopy- information given today.    Algis Greenhouse. Jerline Pain, [place] Resident PGY-3 03/17/2016 2:47 PM

## 2016-03-17 NOTE — Assessment & Plan Note (Signed)
A1c 7.3 today. Discussed starting additional pharmacologic treatment today, however patient deferred. Patient will continue to work on lifestyle modifications. Said that she would decrease amount of soda that she drinks. Will follow up in 3 months.

## 2016-03-17 NOTE — Patient Instructions (Signed)
We will check blood work today.  Your a1c today is 7.3. We want it to be less than 7. Continue trying to improve your diet.  We will not make any medication changes today.  You are due for a mammogram, bone density scan, and a colonoscopy. Please call to get these set up.  Come back to see me in 3 hours.  Take care,  Dr Jerline Pain

## 2016-03-17 NOTE — Assessment & Plan Note (Signed)
Check lipid panel.  Continue atorvastatin. 

## 2016-03-17 NOTE — Assessment & Plan Note (Signed)
At goal today. Continue lisinopril-HCTZ, coreg, and amlodipine.  

## 2016-03-18 LAB — HEPATITIS C ANTIBODY: HCV Ab: NEGATIVE

## 2016-03-21 ENCOUNTER — Encounter: Payer: Self-pay | Admitting: Family Medicine

## 2016-05-02 DIAGNOSIS — H2511 Age-related nuclear cataract, right eye: Secondary | ICD-10-CM | POA: Diagnosis not present

## 2016-05-05 ENCOUNTER — Ambulatory Visit
Admission: RE | Admit: 2016-05-05 | Discharge: 2016-05-05 | Disposition: A | Discharge status: Home / Self Care | Payer: PPO | Source: Ambulatory Visit | Attending: Family Medicine | Admitting: Family Medicine

## 2016-05-05 DIAGNOSIS — E2839 Other primary ovarian failure: Secondary | ICD-10-CM

## 2016-05-05 DIAGNOSIS — Z1239 Encounter for other screening for malignant neoplasm of breast: Secondary | ICD-10-CM

## 2016-05-08 DIAGNOSIS — H25811 Combined forms of age-related cataract, right eye: Secondary | ICD-10-CM | POA: Diagnosis not present

## 2016-05-08 DIAGNOSIS — H2511 Age-related nuclear cataract, right eye: Secondary | ICD-10-CM | POA: Diagnosis not present

## 2016-05-08 DIAGNOSIS — H25011 Cortical age-related cataract, right eye: Secondary | ICD-10-CM | POA: Diagnosis not present

## 2016-06-06 ENCOUNTER — Other Ambulatory Visit: Payer: Self-pay | Admitting: Family Medicine

## 2016-06-06 DIAGNOSIS — I10 Essential (primary) hypertension: Secondary | ICD-10-CM

## 2016-08-04 DIAGNOSIS — Z961 Presence of intraocular lens: Secondary | ICD-10-CM | POA: Diagnosis not present

## 2016-09-15 ENCOUNTER — Other Ambulatory Visit: Payer: Self-pay | Admitting: Family Medicine

## 2016-09-15 DIAGNOSIS — I10 Essential (primary) hypertension: Secondary | ICD-10-CM

## 2016-09-26 ENCOUNTER — Encounter (HOSPITAL_COMMUNITY): Payer: Self-pay

## 2016-09-26 ENCOUNTER — Emergency Department (HOSPITAL_COMMUNITY)
Admission: EM | Admit: 2016-09-26 | Discharge: 2016-09-26 | Disposition: A | Discharge status: Home / Self Care | Payer: PPO | Attending: Emergency Medicine | Admitting: Emergency Medicine

## 2016-09-26 DIAGNOSIS — H66012 Acute suppurative otitis media with spontaneous rupture of ear drum, left ear: Secondary | ICD-10-CM | POA: Insufficient documentation

## 2016-09-26 DIAGNOSIS — J069 Acute upper respiratory infection, unspecified: Secondary | ICD-10-CM | POA: Diagnosis not present

## 2016-09-26 DIAGNOSIS — H66002 Acute suppurative otitis media without spontaneous rupture of ear drum, left ear: Secondary | ICD-10-CM | POA: Diagnosis not present

## 2016-09-26 DIAGNOSIS — Z7984 Long term (current) use of oral hypoglycemic drugs: Secondary | ICD-10-CM | POA: Diagnosis not present

## 2016-09-26 DIAGNOSIS — R059 Cough, unspecified: Secondary | ICD-10-CM

## 2016-09-26 DIAGNOSIS — Z7982 Long term (current) use of aspirin: Secondary | ICD-10-CM | POA: Diagnosis not present

## 2016-09-26 DIAGNOSIS — Z87891 Personal history of nicotine dependence: Secondary | ICD-10-CM | POA: Insufficient documentation

## 2016-09-26 DIAGNOSIS — Z853 Personal history of malignant neoplasm of breast: Secondary | ICD-10-CM | POA: Insufficient documentation

## 2016-09-26 DIAGNOSIS — R05 Cough: Secondary | ICD-10-CM | POA: Diagnosis not present

## 2016-09-26 DIAGNOSIS — E119 Type 2 diabetes mellitus without complications: Secondary | ICD-10-CM | POA: Diagnosis not present

## 2016-09-26 DIAGNOSIS — I10 Essential (primary) hypertension: Secondary | ICD-10-CM | POA: Insufficient documentation

## 2016-09-26 MED ORDER — AMOXICILLIN-POT CLAVULANATE 875-125 MG PO TABS: 1.0000 | ORAL_TABLET | Freq: Two times a day (BID) | ORAL | 0 refills | Status: AC

## 2016-09-26 NOTE — ED Provider Notes (Signed)
[facility] DEPT Provider Note   CSN: [id number] Arrival date & time: 09/26/16  0755     History   Chief Complaint Chief Complaint  Patient presents with  . URI    HPI Ashley Sloan is a 66 y.o. female.  HPI   Saturday started with sore throat, hurts in the back when coughing. Nonproductive cough.  Congestion, mild lighter yellow.  Ear pain on the left.  No fever.  No nausea/vomiting.   Past Medical History:  Diagnosis Date  . Cancer ([place]) 1991   Breast  . Diabetes mellitus   . Hyperlipidemia   . Hypertension     Patient Active Problem List   Diagnosis Date Noted  . Healthcare maintenance 03/17/2016  . Left foot pain 03/10/2015  . Gout 04/16/2014  . Foot deformity 03/16/2014  . HLD (hyperlipidemia) 03/03/2014  . Deformity, foot acquired 10/30/2013  . Right shoulder pain 12/12/2012  . HTN (hypertension), benign 12/19/2011  . Diabetes mellitus, type 2 ([place]) 12/19/2011    Past Surgical History:  Procedure Laterality Date  . BREAST SURGERY  1991   Lumpectomy  . CESAREAN SECTION  1968    OB History    No data available       Home Medications    Prior to Admission medications   Medication Sig Start Date End Date Taking? Authorizing Provider  acetaminophen (TYLENOL) 500 MG tablet Take 1,000 mg by mouth every 6 (six) hours as needed for mild pain. Reported on 08/19/2015   Yes Historical Provider, MD  amLODipine (NORVASC) 10 MG tablet take 1 tablet by mouth once daily 09/18/16  Yes Ashly M Gottschalk, DO  aspirin 81 MG tablet Take 81 mg by mouth daily.   Yes Historical Provider, MD  carvedilol (COREG) 25 MG tablet take 1 tablet by mouth twice a day with food 02/22/16  Yes Vivi Barrack, MD  ibuprofen (ADVIL,MOTRIN) 200 MG tablet Take 200 mg by mouth every 4 (four) hours as needed for mild pain. Reported on 08/19/2015   Yes Historical Provider, MD  lisinopril-hydrochlorothiazide (PRINZIDE,ZESTORETIC) 20-12.5 MG tablet take 2 tablets by mouth once daily 06/06/16   Yes Vivi Barrack, MD  metFORMIN (GLUCOPHAGE) 1000 MG tablet Take 1 tablet (1,000 mg total) by mouth 2 (two) times daily with a meal. 04/23/15  Yes Lupita Dawn, MD  amoxicillin-clavulanate (AUGMENTIN) 875-125 MG tablet Take 1 tablet by mouth every 12 (twelve) hours. 09/26/16 10/03/16  Gareth Morgan, MD  atorvastatin (LIPITOR) 40 MG tablet Take 1 tablet (40 mg total) by mouth daily. Patient not taking: Reported on 09/26/2016 03/10/15   Olam Idler, MD    Family History Family History  Problem Relation Age of Onset  . Diabetes Mother   . Hypertension Mother   . COPD Mother   . Hypertension Father   . Heart disease Sister   . Hypertension Brother   . Diabetes Daughter     Social History Social History  Substance Use Topics  . Smoking status: Former Smoker    Packs/day: 0.50    Years: 25.00    Types: Cigarettes    Start date: 07/03/1964    Quit date: 12/18/1982  . Smokeless tobacco: Never Used  . Alcohol use No     Allergies   Patient has no known allergies.   Review of Systems Review of Systems  Constitutional: Negative for fever.  HENT: Positive for congestion, ear pain and sore throat.   Eyes: Negative for visual disturbance.  Respiratory: Positive for cough.  Negative for shortness of breath.   Cardiovascular: Negative for chest pain.  Gastrointestinal: Negative for abdominal pain, nausea and vomiting.  Genitourinary: Negative for difficulty urinating.  Musculoskeletal: Positive for back pain. Negative for neck pain.  Skin: Negative for rash.  Neurological: Positive for headaches (did have prior). Negative for syncope.     Physical Exam Updated Vital Signs BP (!) 182/75 (BP Location: Right Arm)   Pulse 76   Temp 98.6 F (37 C) (Oral)   Resp 20   Ht 5' (1.524 m)   Wt 170 lb (77.1 kg)   SpO2 98%   BMI 33.20 kg/m   Physical Exam  Constitutional: She is oriented to person, place, and time. She appears well-developed and well-nourished. No distress.  HENT:   Head: Normocephalic and atraumatic.  Left Ear: There is drainage (yellow). Tympanic membrane is perforated (suspect perforated, bulging white TM visualized, inferior portion unable to visulaized due to purulent drainage in the ea).  Eyes: Conjunctivae and EOM are normal.  Neck: Normal range of motion.  Cardiovascular: Normal rate, regular rhythm, normal heart sounds and intact distal pulses.  Exam reveals no gallop and no friction rub.   No murmur heard. Pulmonary/Chest: Effort normal and breath sounds normal. No respiratory distress. She has no wheezes. She has no rales.  Abdominal: Soft. She exhibits no distension. There is no tenderness. There is no guarding.  Musculoskeletal: She exhibits no edema or tenderness.  Neurological: She is alert and oriented to person, place, and time.  Skin: Skin is warm and dry. No rash noted. She is not diaphoretic. No erythema.  Nursing note and vitals reviewed.    ED Treatments / Results  Labs (all labs ordered are listed, but only abnormal results are displayed) Labs Reviewed - No data to display  EKG  EKG Interpretation None       Radiology No results found.  Procedures Procedures (including critical care time)  Medications Ordered in ED Medications - No data to display   Initial Impression / Assessment and Plan / ED Course  I have reviewed the triage vital signs and the nursing notes.  Pertinent labs & imaging results that were available during my care of the patient were reviewed by me and considered in my medical decision making (see chart for details).     66yo DM, htn, hlpd, past breast cancer, who presents with concern for cough, sore throat, congestion, left ear pain.  Suspect viral URI as etiology of symptoms, however sign of otitis media on left with suspected perforation and purulent drainage in canal.  No sign of canal irritation, no pain with ear movements, no sign of otitis externa/malignant otitis externa. Gave rx for  augmentin and discussed reasons to return in detail. Patient discharged in stable condition with understanding of reasons to return.   Final Clinical Impressions(s) / ED Diagnoses   Final diagnoses:  Acute suppurative otitis media of left ear with spontaneous rupture of tympanic membrane, recurrence not specified  Upper respiratory tract infection, unspecified type  Cough    New Prescriptions Discharge Medication List as of 09/26/2016  9:59 AM    START taking these medications   Details  amoxicillin-clavulanate (AUGMENTIN) 875-125 MG tablet Take 1 tablet by mouth every 12 (twelve) hours., Starting Tue 09/26/2016, Until Tue 10/03/2016, Print         Gareth Morgan, MD 09/27/16 [id number]

## 2016-09-26 NOTE — ED Notes (Signed)
Pt states she understands instructions and home stable with steady gait.

## 2016-09-26 NOTE — ED Triage Notes (Signed)
Pt states recurrent URI symptoms. Currently cough, sore throat, left ear pain. Pt states lower back hurts with coughing. No resp distress noted. Resp e/u.

## 2016-10-04 ENCOUNTER — Encounter (HOSPITAL_COMMUNITY): Payer: Self-pay | Admitting: *Deleted

## 2016-10-04 ENCOUNTER — Emergency Department (HOSPITAL_COMMUNITY)
Admission: EM | Admit: 2016-10-04 | Discharge: 2016-10-04 | Disposition: A | Discharge status: Home / Self Care | Payer: PPO | Attending: Physician Assistant | Admitting: Physician Assistant

## 2016-10-04 DIAGNOSIS — Z7984 Long term (current) use of oral hypoglycemic drugs: Secondary | ICD-10-CM | POA: Insufficient documentation

## 2016-10-04 DIAGNOSIS — Z87891 Personal history of nicotine dependence: Secondary | ICD-10-CM | POA: Insufficient documentation

## 2016-10-04 DIAGNOSIS — M5441 Lumbago with sciatica, right side: Secondary | ICD-10-CM | POA: Diagnosis not present

## 2016-10-04 DIAGNOSIS — Z853 Personal history of malignant neoplasm of breast: Secondary | ICD-10-CM | POA: Insufficient documentation

## 2016-10-04 DIAGNOSIS — Z7982 Long term (current) use of aspirin: Secondary | ICD-10-CM | POA: Insufficient documentation

## 2016-10-04 DIAGNOSIS — I10 Essential (primary) hypertension: Secondary | ICD-10-CM | POA: Insufficient documentation

## 2016-10-04 DIAGNOSIS — E119 Type 2 diabetes mellitus without complications: Secondary | ICD-10-CM | POA: Diagnosis not present

## 2016-10-04 DIAGNOSIS — M62830 Muscle spasm of back: Secondary | ICD-10-CM | POA: Diagnosis not present

## 2016-10-04 DIAGNOSIS — M545 Low back pain: Secondary | ICD-10-CM | POA: Diagnosis not present

## 2016-10-04 DIAGNOSIS — Z79899 Other long term (current) drug therapy: Secondary | ICD-10-CM | POA: Diagnosis not present

## 2016-10-04 MED ORDER — CYCLOBENZAPRINE HCL 5 MG PO TABS: 5.0000 mg | ORAL_TABLET | Freq: Three times a day (TID) | ORAL | 0 refills | Status: DC | PRN

## 2016-10-04 MED ORDER — KETOROLAC TROMETHAMINE 30 MG/ML IJ SOLN
30.0000 mg | Freq: Once | INTRAMUSCULAR | Status: DC
Start: 2016-10-04 — End: 2016-10-04

## 2016-10-04 NOTE — ED Provider Notes (Signed)
[facility] DEPT Provider Note   CSN: [id number] Arrival date & time: 10/04/16  1962  By signing my name below, I, Ashley Sloan, attest that this documentation has been prepared under the direction and in the presence of Lorin Glass, PA-C. Electronically Signed: Ethelle Lyon Sloan, Scribe. 10/04/2016. 2:36 PM.   History   Chief Complaint Chief Complaint  Patient presents with  . Back Pain   The history is provided by the patient and medical records. No language interpreter was used.   HPI Comments:  Ashley Sloan is an obese 66 y.o. female with a PMHx of HTN, CA, DM and HLD, who presents to the Emergency Department complaining of gradually worsening, intermittent, radiating, 10/10 right-sided back pain onset two days ago. She reports her right middle/lower back is "spasming" and has gotten progressively worse since the onset of her pain. She reports prior to the acute pain, the right side of her back has been mildly aching for the past several months. No recent trauma, activity changes, or injury. Pt has an associated symptom of sharp, lateral right leg pain that stops at her knee radiating from her back. She reports increased difficulty ambulating d/t pain since the onset of her symptoms. She tried Ibuprofen and heat compress at home with no relief of her symptoms. No h/o renal insufficiency. Pt denies numbness, dysuria, hematuria, frequency, difficulty urinating, fever, chills, diaphoresis, and any other complaints at this time. 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   PCP: [facility]  Past Medical History:  Diagnosis Date  . Cancer ([place]) 1991   Breast  . Diabetes mellitus   . Hyperlipidemia   . Hypertension     Patient Active Problem List   Diagnosis Date Noted  . Healthcare maintenance 03/17/2016  . Left foot pain 03/10/2015  . Gout 04/16/2014  . Foot deformity 03/16/2014  . HLD (hyperlipidemia) 03/03/2014  .  Deformity, foot acquired 10/30/2013  . Right shoulder pain 12/12/2012  . HTN (hypertension), benign 12/19/2011  . Diabetes mellitus, type 2 ([place]) 12/19/2011    Past Surgical History:  Procedure Laterality Date  . BREAST SURGERY  1991   Lumpectomy  . CESAREAN SECTION  1968    OB History    No data available       Home Medications    Prior to Admission medications   Medication Sig Start Date End Date Taking? Authorizing Provider  acetaminophen (TYLENOL) 500 MG tablet Take 1,000 mg by mouth every 6 (six) hours as needed for mild pain. Reported on 08/19/2015    Historical Provider, MD  amLODipine (NORVASC) 10 MG tablet take 1 tablet by mouth once daily 09/18/16   Janora Norlander, DO  aspirin 81 MG tablet Take 81 mg by mouth daily.    Historical Provider, MD  atorvastatin (LIPITOR) 40 MG tablet Take 1 tablet (40 mg total) by mouth daily. Patient not taking: Reported on 09/26/2016 03/10/15   Olam Idler, MD  carvedilol (COREG) 25 MG tablet take 1 tablet by mouth twice a day with food 02/22/16   Vivi Barrack, MD  cyclobenzaprine (FLEXERIL) 5 MG tablet Take 1 tablet (5 mg total) by mouth 3 (three) times daily as needed for muscle spasms. 10/04/16   Lorin Glass, PA-C  ibuprofen (ADVIL,MOTRIN) 200 MG tablet Take 200 mg by mouth every 4 (four) hours as needed for mild pain. Reported on 08/19/2015    Historical Provider, MD  lisinopril-hydrochlorothiazide Reita May)  20-12.5 MG tablet take 2 tablets by mouth once daily 06/06/16   Vivi Barrack, MD  metFORMIN (GLUCOPHAGE) 1000 MG tablet Take 1 tablet (1,000 mg total) by mouth 2 (two) times daily with a meal. 04/23/15   Lupita Dawn, MD    Family History Family History  Problem Relation Age of Onset  . Diabetes Mother   . Hypertension Mother   . COPD Mother   . Hypertension Father   . Heart disease Sister   . Hypertension Brother   . Diabetes Daughter     Social History Social History  Substance Use Topics  .  Smoking status: Former Smoker    Packs/day: 0.50    Years: 25.00    Types: Cigarettes    Start date: 07/03/1964    Quit date: 12/18/1982  . Smokeless tobacco: Never Used  . Alcohol use No     Allergies   Patient has no known allergies.   Review of Systems Review of Systems  Constitutional: Negative for chills, diaphoresis and fever.  HENT: Negative for ear pain and sore throat.   Eyes: Negative for pain and visual disturbance.  Respiratory: Negative for cough and shortness of breath.   Cardiovascular: Negative for chest pain and palpitations.  Gastrointestinal: Negative for abdominal pain and vomiting.  Genitourinary: Negative for difficulty urinating, dysuria, frequency and hematuria.  Musculoskeletal: Positive for back pain and myalgias. Negative for arthralgias, gait problem, joint swelling, neck pain and neck stiffness.  Skin: Negative for color change and rash.  Neurological: Negative for seizures, syncope and numbness.  Hematological: Does not bruise/bleed easily.  All other systems reviewed and are negative.    Physical Exam Updated Vital Signs BP (!) 154/76 (BP Location: Left Arm)   Pulse 66   Temp 98 F (36.7 C) (Oral)   Resp 16   Ht 5' (1.524 m)   Wt 78.5 kg   SpO2 100%   BMI 33.79 kg/m   Physical Exam  Constitutional: She is oriented to person, place, and time. She appears well-developed and well-nourished. No distress.  HENT:  Head: Normocephalic and atraumatic.  Eyes: Conjunctivae are normal.  Neck: Normal range of motion. Neck supple.  Cardiovascular: Normal rate.   Pulmonary/Chest: Effort normal and breath sounds normal. No stridor. No respiratory distress. She has no wheezes. She has no rales. She exhibits no tenderness.  Abdominal: Soft. Bowel sounds are normal. She exhibits no distension.  Musculoskeletal: Normal range of motion. She exhibits no deformity.       Lumbar back: She exhibits pain and spasm. She exhibits no tenderness, no bony  tenderness and no swelling.       Right upper leg: Normal.       Left upper leg: Normal.  Right lumbar paraspinal muscles spasms present.  Distal extremities have intact sensation, motor and circulatory function.   Neurological: She is alert and oriented to person, place, and time. She has normal strength. She displays no tremor. She exhibits normal muscle tone. Coordination and gait normal.  Skin: Skin is warm and dry.  Psychiatric: She has a normal mood and affect. Her behavior is normal.  Nursing note and vitals reviewed.    ED Treatments / Results  DIAGNOSTIC STUDIES:  Oxygen Saturation is 100% on RA, normal by my interpretation.   Hypertensive, no signs of urgency or emergency.    COORDINATION OF CARE:  9:26 AM Discussed treatment plan with pt at bedside including rest and a Muscle Relaxant and pt agreed to plan.  Labs (all labs ordered are listed, but only abnormal results are displayed) Labs Reviewed - No data to display  EKG  EKG Interpretation None       Radiology No results found.  Procedures Procedures (including critical care time)  Medications Ordered in ED Medications - No data to display   Initial Impression / Assessment and Plan / ED Course  I have reviewed the triage vital signs and the nursing notes.  Pertinent labs & imaging results that were available during my care of the patient were reviewed by me and considered in my medical decision making (see chart for details).    Patient with back pain.  No neurological deficits and normal neuro exam.  Patient can walk but states is painful.  No loss of bowel or bladder control.  No concern for cauda equina.  No fever, night sweats, weight loss, h/o cancer, IVDU.  RICE protocol and pain medicine indicated and discussed with patient. Toradol was considered but not given based on age and previous GFR.  She was given a Rx for flexeril.  She has a history of HTN and was advised to follow up with her PCP for  evaluation of her HTN and her back pain.  Final Clinical Impressions(s) / ED Diagnoses   Final diagnoses:  Right-sided low back pain with right-sided sciatica, unspecified chronicity  Muscle spasm of back    New Prescriptions Discharge Medication List as of 10/04/2016  9:59 AM    START taking these medications   Details  cyclobenzaprine (FLEXERIL) 5 MG tablet Take 1 tablet (5 mg total) by mouth 3 (three) times daily as needed for muscle spasms., Starting Wed 10/04/2016, Print         I personally performed the services described in this documentation, which was scribed in my presence. The recorded information has been reviewed and is accurate.     Lorin Glass, PA-C 10/04/16 [place], MD 10/05/16 1531

## 2016-10-04 NOTE — ED Triage Notes (Signed)
Pt reports right side back pain and spasms x 3 days. Pain increases with movement and denies urinary symptoms.

## 2016-10-14 ENCOUNTER — Other Ambulatory Visit: Payer: Self-pay | Admitting: Family Medicine

## 2016-10-14 DIAGNOSIS — E11618 Type 2 diabetes mellitus with other diabetic arthropathy: Secondary | ICD-10-CM

## 2016-10-14 DIAGNOSIS — I10 Essential (primary) hypertension: Secondary | ICD-10-CM

## 2016-10-16 NOTE — Telephone Encounter (Signed)
30 day supply given. Patient needs appointment for further refills.  Ashley Sloan. Jerline Pain, [facility] Resident PGY-3 10/16/2016 1:38 PM

## 2016-10-16 NOTE — Telephone Encounter (Signed)
Attempted to contact patient, no answer, a vm was left to inform her to call the office to schedule a fu apt with pcp for further refills. Please assist her in scheduling.

## 2016-12-01 ENCOUNTER — Encounter: Payer: Self-pay | Admitting: Family Medicine

## 2016-12-01 ENCOUNTER — Ambulatory Visit (INDEPENDENT_AMBULATORY_CARE_PROVIDER_SITE_OTHER): Payer: PPO | Admitting: Family Medicine

## 2016-12-01 VITALS — BP 138/83 | HR 82 | Temp 98.2°F | Wt 171.0 lb

## 2016-12-01 DIAGNOSIS — E785 Hyperlipidemia, unspecified: Secondary | ICD-10-CM | POA: Diagnosis not present

## 2016-12-01 DIAGNOSIS — M79605 Pain in left leg: Secondary | ICD-10-CM | POA: Diagnosis not present

## 2016-12-01 DIAGNOSIS — E11618 Type 2 diabetes mellitus with other diabetic arthropathy: Secondary | ICD-10-CM

## 2016-12-01 DIAGNOSIS — I10 Essential (primary) hypertension: Secondary | ICD-10-CM

## 2016-12-01 DIAGNOSIS — M79642 Pain in left hand: Secondary | ICD-10-CM | POA: Insufficient documentation

## 2016-12-01 LAB — POCT GLYCOSYLATED HEMOGLOBIN (HGB A1C): HEMOGLOBIN A1C: 7.5

## 2016-12-01 MED ORDER — GABAPENTIN 100 MG PO CAPS: 100.0000 mg | ORAL_CAPSULE | Freq: Every day | ORAL | 2 refills | Status: DC

## 2016-12-01 MED ORDER — DICLOFENAC SODIUM 1 % TD GEL: 2.0000 g | Freq: Four times a day (QID) | TRANSDERMAL | 2 refills | Status: DC

## 2016-12-01 MED ORDER — LISINOPRIL-HYDROCHLOROTHIAZIDE 20-12.5 MG PO TABS: 2.0000 | ORAL_TABLET | Freq: Every day | ORAL | 11 refills | Status: DC

## 2016-12-01 MED ORDER — METFORMIN HCL 1000 MG PO TABS: 1000.0000 mg | ORAL_TABLET | Freq: Two times a day (BID) | ORAL | 11 refills | Status: DC

## 2016-12-01 MED ORDER — CARVEDILOL 25 MG PO TABS: 25.0000 mg | ORAL_TABLET | Freq: Two times a day (BID) | ORAL | 11 refills | Status: DC

## 2016-12-01 MED ORDER — ATORVASTATIN CALCIUM 40 MG PO TABS: 40.0000 mg | ORAL_TABLET | Freq: Every day | ORAL | 3 refills | Status: DC

## 2016-12-01 NOTE — Assessment & Plan Note (Signed)
Likely OA. Start voltargen gel. Check plain film.

## 2016-12-01 NOTE — Assessment & Plan Note (Signed)
A1c elevated to 7.5. Patient again deferred starting new pharmacologic agent at this time. Follow up in 3 months.

## 2016-12-01 NOTE — Progress Notes (Signed)
    Subjective:  Ashley Sloan is a 66 y.o. female who presents to the [facility] today with a chief complaint of left leg pain.   HPI:  Left Leg Pain Symptoms started several months ago. Pain throughout her entire leg. Pain is sharp in nature. Occasionally gets a pin and needles sensation. Nothing that makes it better or worse. No obvious precipitating events.Tried ibuprofen which helped some.   Left Hand Pain Symptoms started about a month ago. No obvious precipitating events. No swelling. Left handed. Tried ibuprofen which did not help.   T2DM Tolerating metformin without side effects. No hypoglycemic symptoms. No polyuria or polydipsia.   Hypertension BP Readings from Last 3 Encounters:  12/01/16 138/83  10/04/16 (!) 154/76  09/26/16 (!) 182/75   Home BP monitoring-Yes Compliant with medications-yes, without side effects ROS-Denies any CP, HA, SOB, blurry vision, LE edema, transient weakness, orthopnea, PND.   HLD Stopped atorvastatin. Ran out of refills.   ROS: Per HPI  PMH: Smoking history reviewed.    Objective:  Physical Exam: BP 138/83   Pulse 82   Temp 98.2 F (36.8 C) (Oral)   Wt 171 lb (77.6 kg)   SpO2 99%   BMI 33.40 kg/m   Gen: NAD, resting comfortably CV: RRR with no murmurs appreciated Pulm: NWOB, CTAB with no crackles, wheezes, or rhonchi GI: Normal bowel sounds present. Soft, Nontender, Nondistended. MSK: - L hand: Slight ulnar deviation of digits noted with enlarged MTP and PIP joints. Limited ability to for fist. Strength 5/5 with thumb abduction and opposition. Tinel's negative. Phallen negative.  - Back : No deformity. - LLE: No deformities. Strength 5/5 throughout. Sensation to light touch intact. Straight leg raise positive.  Skin: warm, dry Neuro: grossly normal, moves all extremities Psych: Normal affect and thought content  Assessment/Plan:  Pain of left lower extremity Likely sciatica given positive SLR and distribution of pain. No red  flag signs or symptoms. Will start low dose gabapentin and check lumbar plain films.   Left hand pain Likely OA. Start voltargen gel. Check plain film.   Diabetes mellitus, type 2 (HCC) A1c elevated to 7.5. Patient again deferred starting new pharmacologic agent at this time. Follow up in 3 months.   HTN (hypertension), benign At goal today. Continue lisinopril-HCTZ, coreg, and amlodipine.   HLD (hyperlipidemia) Restart atorvastatin. Due for recheck lipid panel in 3 months.   Algis Greenhouse. Jerline Pain, [facility] Resident PGY-3 12/01/2016 3:55 PM

## 2016-12-01 NOTE — Patient Instructions (Signed)
Start the gabapentin and voltaren gel.  We will check an xray of your hand and back.  Come back in 3 months.  Take care,  Dr Jerline Pain

## 2016-12-01 NOTE — Assessment & Plan Note (Signed)
Likely sciatica given positive SLR and distribution of pain. No red flag signs or symptoms. Will start low dose gabapentin and check lumbar plain films.

## 2016-12-01 NOTE — Assessment & Plan Note (Signed)
Restart atorvastatin. Due for recheck lipid panel in 3 months.

## 2016-12-01 NOTE — Assessment & Plan Note (Signed)
At goal today. Continue lisinopril-HCTZ, coreg, and amlodipine.

## 2016-12-14 ENCOUNTER — Ambulatory Visit (HOSPITAL_COMMUNITY)
Admission: RE | Admit: 2016-12-14 | Discharge: 2016-12-14 | Disposition: A | Discharge status: Home / Self Care | Payer: PPO | Source: Ambulatory Visit | Attending: Family Medicine | Admitting: Family Medicine

## 2016-12-14 DIAGNOSIS — M4316 Spondylolisthesis, lumbar region: Secondary | ICD-10-CM | POA: Insufficient documentation

## 2016-12-14 DIAGNOSIS — M79642 Pain in left hand: Secondary | ICD-10-CM | POA: Diagnosis not present

## 2016-12-14 DIAGNOSIS — M545 Low back pain: Secondary | ICD-10-CM | POA: Diagnosis not present

## 2016-12-14 DIAGNOSIS — M79605 Pain in left leg: Secondary | ICD-10-CM | POA: Insufficient documentation

## 2016-12-14 DIAGNOSIS — M19042 Primary osteoarthritis, left hand: Secondary | ICD-10-CM | POA: Insufficient documentation

## 2016-12-14 DIAGNOSIS — M5136 Other intervertebral disc degeneration, lumbar region: Secondary | ICD-10-CM | POA: Diagnosis not present

## 2016-12-15 ENCOUNTER — Telehealth: Payer: Self-pay | Admitting: Family Medicine

## 2016-12-15 NOTE — Telephone Encounter (Signed)
Pt contacted and informed of xray interpretation and no change to treatment plan. Pt voiced understanding and did not have any questions at this time.

## 2016-12-15 NOTE — Telephone Encounter (Signed)
Called patient to discuss plain films. Xrays show degenerative changes in her back and hand which are causing her symptoms. We do not need to make any changes to her treatment plan at this time.  Algis Greenhouse. Jerline Pain, [facility] Resident PGY-3 12/15/2016 12:23 PM

## 2017-09-27 ENCOUNTER — Other Ambulatory Visit: Payer: Self-pay

## 2017-09-27 DIAGNOSIS — E785 Hyperlipidemia, unspecified: Secondary | ICD-10-CM

## 2017-09-28 ENCOUNTER — Other Ambulatory Visit: Payer: Self-pay | Admitting: Family Medicine

## 2017-09-28 DIAGNOSIS — E785 Hyperlipidemia, unspecified: Secondary | ICD-10-CM

## 2017-09-28 DIAGNOSIS — E119 Type 2 diabetes mellitus without complications: Secondary | ICD-10-CM

## 2017-09-28 DIAGNOSIS — I10 Essential (primary) hypertension: Secondary | ICD-10-CM

## 2017-09-28 DIAGNOSIS — Z794 Long term (current) use of insulin: Secondary | ICD-10-CM

## 2017-09-28 MED ORDER — ATORVASTATIN CALCIUM 40 MG PO TABS: 40.0000 mg | ORAL_TABLET | Freq: Every day | ORAL | 3 refills | Status: DC

## 2017-09-28 NOTE — Telephone Encounter (Signed)
Refill given. Patient needs appointment to follow up on diabetes and needs new labs. Order placed for fasting lipid panel. Please let patient know. Thanks! -- Bryson Ha

## 2017-09-28 NOTE — Telephone Encounter (Signed)
Appt made for 10-05-17 with Dr. Brett Albino since PCP doesn't have any morning appts.  Anacleto Batterman,CMA

## 2017-10-05 ENCOUNTER — Ambulatory Visit (INDEPENDENT_AMBULATORY_CARE_PROVIDER_SITE_OTHER): Payer: PPO | Admitting: Internal Medicine

## 2017-10-05 ENCOUNTER — Other Ambulatory Visit: Payer: Self-pay

## 2017-10-05 ENCOUNTER — Encounter: Payer: Self-pay | Admitting: Internal Medicine

## 2017-10-05 VITALS — BP 136/74 | HR 62 | Temp 98.1°F | Wt 163.0 lb

## 2017-10-05 DIAGNOSIS — Z23 Encounter for immunization: Secondary | ICD-10-CM

## 2017-10-05 DIAGNOSIS — N649 Disorder of breast, unspecified: Secondary | ICD-10-CM | POA: Diagnosis not present

## 2017-10-05 DIAGNOSIS — Z0000 Encounter for general adult medical examination without abnormal findings: Secondary | ICD-10-CM

## 2017-10-05 DIAGNOSIS — Z1211 Encounter for screening for malignant neoplasm of colon: Secondary | ICD-10-CM

## 2017-10-05 DIAGNOSIS — Z794 Long term (current) use of insulin: Secondary | ICD-10-CM

## 2017-10-05 DIAGNOSIS — E119 Type 2 diabetes mellitus without complications: Secondary | ICD-10-CM

## 2017-10-05 LAB — POCT GLYCOSYLATED HEMOGLOBIN (HGB A1C): HEMOGLOBIN A1C: 7.6

## 2017-10-05 NOTE — Patient Instructions (Addendum)
It was so nice to meet you!  Your A1c was 7.6. We want this number to be between 7 and 8. Please keep taking your Metformin.  I have sent in a referral to the breast surgeon to look at your breast sore. They will determine if you need an ultrasound or a mammogram.  I have also sent in a referral to the stomach doctor for your to have your colonoscopy done.  I have ordered some labs for you and I will call you with these results next week.  -Dr. Brett Albino

## 2017-10-05 NOTE — Progress Notes (Signed)
   [facility] Phone: [phone]  Subjective:  Ashley Sloan is a 67 year old female presenting to clinic for follow-up of her diabetes and to discuss a breast sore.  T2DM: Taking Metformin 1000mg  bid at home. Does not check her blood sugars. No side effects to the Metformin except that she has noticed she has an upset stomach if she takes the Metformin without food. No polyuria, no polydipsia.  Left Breast Sore: Has been there for 1 year. She has associated breast pain that feels like "a hurt". The pain comes and goes. The sore is tender to the touch. She also notes some itching of the sore. She has not put anything on the sore. She states she was scheduled to have a mammogram done last year, but they saw her breast sore and said she needed to have an ultrasound done instead. She never had the US performed. No nipple discharge, no breast lumps, no axillary lumps.  ROS: See HPI for pertinent positives and negatives  Past Medical History- HTN, T2DM, HLD  Family history reviewed for today's visit. No changes.  Social history- patient is a former smoker  Objective: BP 136/74   Pulse 62   Temp 98.1 F (36.7 C) (Oral)   Wt 163 lb (73.9 kg)   SpO2 99%   BMI 31.83 kg/m  Gen: NAD, alert, cooperative with exam HEENT: NCAT, EOMI, MMM Neck: FROM, supple CV: RRR, no murmur Resp: CTABL, no wheezes, normal work of breathing Breast: No masses appreciated in the breast tissue or axillae bilaterally, 2cm x 2cm skin lesion present in the 2 o'clock position of the left breast. Hardened yellow material present. Skin surrounding the lesion is firm to palpation. GI: SNTND, BS present, no guarding or organomegaly Msk: No edema, warm, normal tone, moves UE/LE spontaneously Diabetic Foot: No deformities, no ulcerations, no other skin breakdown bilaterally; intact to touch and monofilament testing bilaterally; PT and DP pulse intact  bilaterally  Assessment/Plan: T2DM: Well-controlled. A1c 7.6 today. Think goal of 7-8 is likely appropriate for age. - Continue Metformin 1000mg  bid - Check BMP and lipid panel - Diabetic foot exam performed today and was normal - Pneumonia vaccine given today - Patient saw Dr. Katy Fitch for annual diabetic eye exam last year- will obtain records - Follow-up with PCP in 6 months  Left Breast Lesion: Located in the 2 o'clock position of the left breast. Looks like a possible epidermoid cyst. Lesion appears to be confined to the dermis, rather than in the breast tissue. Apparently tried to have a mammogram done last year, but she was told she couldn't have this done with the breast lesion. - Referral placed to breast surgeon for further management - Will need mammogram ordered after that  Health Care Maintenance: - Referral placed to GI for colonoscopy   Hyman Bible, MD PGY-3

## 2017-10-05 NOTE — Assessment & Plan Note (Signed)
Located in the 2 o'clock position of the left breast. Looks like a possible epidermoid cyst. Lesion appears to be confined to the dermis, rather than in the breast tissue. Apparently tried to have a mammogram done last year, but she was told she couldn't have this done with the breast lesion. - Referral placed to breast surgeon for further management - Will need mammogram ordered after that

## 2017-10-05 NOTE — Assessment & Plan Note (Signed)
Well-controlled. A1c 7.6 today. Think goal of 7-8 is likely appropriate for age. - Continue Metformin 1000mg  bid - Check BMP and lipid panel - Diabetic foot exam performed today and was normal - Pneumonia vaccine given today - Patient saw Dr. Arielle Eber Fitch for annual diabetic eye exam last year- will obtain records - Follow-up with PCP in 6 months

## 2017-10-05 NOTE — Assessment & Plan Note (Signed)
-   Referral placed to GI for colonoscopy.

## 2017-10-06 LAB — BASIC METABOLIC PANEL
BUN / CREAT RATIO: 22 (ref 12–28)
BUN: 22 mg/dL (ref 8–27)
CO2: 23 mmol/L (ref 20–29)
CREATININE: 1.02 mg/dL — AB (ref 0.57–1.00)
Calcium: 9.9 mg/dL (ref 8.7–10.3)
Chloride: 99 mmol/L (ref 96–106)
GFR calc Af Amer: 65 mL/min/{1.73_m2} (ref >59)
GFR calc non Af Amer: 56 mL/min/{1.73_m2} — ABNORMAL LOW (ref >59)
GLUCOSE: 127 mg/dL — AB (ref 65–99)
POTASSIUM: 4.2 mmol/L (ref 3.5–5.2)
SODIUM: 141 mmol/L (ref 134–144)

## 2017-10-06 LAB — LIPID PANEL
CHOLESTEROL TOTAL: 116 mg/dL (ref 100–199)
Chol/HDL Ratio: 2.1 ratio (ref 0.0–4.4)
HDL: 54 mg/dL (ref >39)
LDL CALC: 50 mg/dL (ref 0–99)
TRIGLYCERIDES: 58 mg/dL (ref 0–149)
VLDL Cholesterol Cal: 12 mg/dL (ref 5–40)

## 2017-10-08 ENCOUNTER — Other Ambulatory Visit: Payer: Self-pay

## 2017-10-08 DIAGNOSIS — I10 Essential (primary) hypertension: Secondary | ICD-10-CM

## 2017-10-09 MED ORDER — AMLODIPINE BESYLATE 10 MG PO TABS: 10.0000 mg | ORAL_TABLET | Freq: Every day | ORAL | 3 refills | Status: DC

## 2017-11-06 DIAGNOSIS — N6320 Unspecified lump in the left breast, unspecified quadrant: Secondary | ICD-10-CM | POA: Diagnosis not present

## 2017-11-13 ENCOUNTER — Other Ambulatory Visit: Payer: Self-pay

## 2017-11-13 ENCOUNTER — Encounter (HOSPITAL_BASED_OUTPATIENT_CLINIC_OR_DEPARTMENT_OTHER): Payer: Self-pay | Admitting: *Deleted

## 2017-11-16 ENCOUNTER — Ambulatory Visit: Payer: Self-pay | Admitting: General Surgery

## 2017-11-16 ENCOUNTER — Encounter (HOSPITAL_BASED_OUTPATIENT_CLINIC_OR_DEPARTMENT_OTHER)
Admission: RE | Admit: 2017-11-16 | Discharge: 2017-11-16 | Disposition: A | Discharge status: Home / Self Care | Payer: PPO | Source: Ambulatory Visit | Attending: General Surgery | Admitting: General Surgery

## 2017-11-16 DIAGNOSIS — Z853 Personal history of malignant neoplasm of breast: Secondary | ICD-10-CM | POA: Diagnosis not present

## 2017-11-16 DIAGNOSIS — Z7984 Long term (current) use of oral hypoglycemic drugs: Secondary | ICD-10-CM | POA: Diagnosis not present

## 2017-11-16 DIAGNOSIS — N6489 Other specified disorders of breast: Secondary | ICD-10-CM | POA: Diagnosis not present

## 2017-11-16 DIAGNOSIS — Z79899 Other long term (current) drug therapy: Secondary | ICD-10-CM | POA: Diagnosis not present

## 2017-11-16 DIAGNOSIS — Z87891 Personal history of nicotine dependence: Secondary | ICD-10-CM | POA: Diagnosis not present

## 2017-11-16 DIAGNOSIS — Z9221 Personal history of antineoplastic chemotherapy: Secondary | ICD-10-CM | POA: Diagnosis not present

## 2017-11-16 DIAGNOSIS — N6320 Unspecified lump in the left breast, unspecified quadrant: Secondary | ICD-10-CM | POA: Diagnosis present

## 2017-11-16 DIAGNOSIS — E7800 Pure hypercholesterolemia, unspecified: Secondary | ICD-10-CM | POA: Diagnosis not present

## 2017-11-16 DIAGNOSIS — Z791 Long term (current) use of non-steroidal anti-inflammatories (NSAID): Secondary | ICD-10-CM | POA: Diagnosis not present

## 2017-11-16 DIAGNOSIS — E119 Type 2 diabetes mellitus without complications: Secondary | ICD-10-CM | POA: Diagnosis not present

## 2017-11-16 DIAGNOSIS — Z7982 Long term (current) use of aspirin: Secondary | ICD-10-CM | POA: Diagnosis not present

## 2017-11-16 DIAGNOSIS — Z923 Personal history of irradiation: Secondary | ICD-10-CM | POA: Diagnosis not present

## 2017-11-16 DIAGNOSIS — I10 Essential (primary) hypertension: Secondary | ICD-10-CM | POA: Diagnosis not present

## 2017-11-16 LAB — BASIC METABOLIC PANEL
Anion gap: 10 (ref 5–15)
BUN: 14 mg/dL (ref 6–20)
CHLORIDE: 100 mmol/L — AB (ref 101–111)
CO2: 27 mmol/L (ref 22–32)
CREATININE: 0.92 mg/dL (ref 0.44–1.00)
Calcium: 9.7 mg/dL (ref 8.9–10.3)
Glucose, Bld: 143 mg/dL — ABNORMAL HIGH (ref 65–99)
Potassium: 3.8 mmol/L (ref 3.5–5.1)
SODIUM: 137 mmol/L (ref 135–145)

## 2017-11-16 NOTE — H&P (Signed)
History of Present Illness Ashley Kitchen T. Bianna Haran MD; 11/06/2017 2:36 PM) The patient is a 67 year old female who presents with a complaint of Breast problems. Patient is a 67 year old female referred by Dr. Brett Albino for left breast problems. She has a history of left breast cancer treated in 1991 by Dr. Margot Chimes with lumpectomy and chemotherapy and radiation. I do not have the details of that cancer. She apparently has been cancer free since. She has not had a mammogram she thinks in 5 or 6 years. For about at least a year she has had an area on her upper left breast at the site of the lumpectomy scar with skin thickening and crusting and itching. She feels it has gotten somewhat worse. She apparently tried to have a mammogram last year but it could not be done because of the lesion on her skin. No other problems in either breast. She is diabetic.   Past Surgical History Levonne Spiller, CMA; 11/06/2017 1:39 PM) Breast Mass; Local Excision  Left. Cesarean Section - 1   Diagnostic Studies History Levonne Spiller, CMA; 11/06/2017 1:39 PM) Colonoscopy  never  Allergies Andee Poles Gerrigner, CMA; 11/06/2017 1:42 PM) No Known Drug Allergies [11/06/2017]: Allergies Reconciled   Medication History Levonne Spiller, CMA; 11/06/2017 1:45 PM) AmLODIPine Besylate (10MG  Tablet, Oral) Active. Atorvastatin Calcium (40MG  Tablet, Oral) Active. Carvedilol (25MG  Tablet, Oral) Active. Diclofenac Sodium (2% Solution, Transdermal) Active. Gabapentin (100MG  Capsule, Oral) Active. Lisinopril-Hydrochlorothiazide (Oral) Specific strength unknown - Active. MetFORMIN HCl ER (MOD) (1000MG  Tablet ER 24HR, Oral) Active. Tylenol PM Extra Strength (1000-50MG /30ML Liquid, Oral) Active. Aspirin (81MG  Tablet DR, Oral) Active. Ibuprofen (200MG  Tablet, Oral) Active. 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 Caffeine use   Carbonated beverages, Coffee. Tobacco use  Former smoker.  Family History Levonne Spiller, CMA; 11/06/2017 1:39 PM) Arthritis  Father. Heart Disease  Sister. Hypertension  Brother, Father. Respiratory Condition  Brother.  Pregnancy / Birth History Levonne Spiller, CMA; 11/06/2017 1:39 PM) Age at menarche  65 years. Age of menopause  80-60 Gravida  2 Maternal age  24-20 Para  1  Other Problems Levonne Spiller, CMA; 11/06/2017 1:39 PM) Arthritis  Back Pain  Breast Cancer  Diabetes Mellitus  High blood pressure  Hypercholesterolemia     Review of Systems Andee Poles Gerrigner CMA; 11/06/2017 1:40 PM) HEENT Present- Hearing Loss and Wears glasses/contact lenses. Not Present- Earache, Hoarseness, Nose Bleed, Oral Ulcers, Ringing in the Ears, Seasonal Allergies, Sinus Pain, Sore Throat, Visual Disturbances and Yellow Eyes. Breast Present- Breast Pain and Skin Changes. Not Present- Breast Mass and Nipple Discharge. Female Genitourinary Present- Nocturia. Not Present- Frequency, Painful Urination, Pelvic Pain and Urgency. Psychiatric Present- Depression and Frequent crying. Not Present- Anxiety, Bipolar, Change in Sleep Pattern and Fearful.  Vitals (Danielle Gerrigner CMA; 11/06/2017 1:41 PM) 11/06/2017 1:41 PM Weight: 170.13 lb Height: 60in Body Surface Area: 1.74 m Body Mass Index: 33.22 kg/m  Temp.: 98.59F(Oral)  Pulse: 68 (Regular)  BP: 130/82 (Sitting, Left Arm, Standard)       Physical Exam Ashley Kitchen T. Maliya Marich MD; 11/06/2017 2:38 PM) The physical exam findings are as follows: Note:General: Pleasant well-developed F American female no distress Skin: No rash or infection, see breast HEENT: No palpable masses or thyromegaly. Sclera nonicteric Lymph nodes: No cervical, supraclavicular or axillary nodes palpable Breasts: In the upper breast at 12 o'clock position and apparently the site of her previous lumpectomy scar is a 3 x 1 cm area of  marked skin thickening and induration. There is a central verrucous appearing lesion laterally with some breakdown and calcification centrally. No definite underlying breast mass. No other abnormalities palpable in either breast. Lungs: Clear to respirations without wheezing Cardiac: Regular rate and rhythm without murmurs or extra Ms.: No edema or deformity Neurologic: Alert and fully oriented. Affect normal.    Assessment & Plan Ashley Kitchen T. Kalani Baray MD; 11/06/2017 2:40 PM) BREAST MASS, LEFT (N63.20) Impression: Unusual appearing mass involving skin and probably subcutaneous of the left breast had apparently previous lumpectomy site with history of lumpectomy and radiation and chemotherapy 1991. This may be an unusual keloid or calcified sebaceous cyst. Cannot rule out malignancy. This clearly needs to be excised. I discussed this with the patient. We discussed obesity some slight deformity of the breast due to the amount of skin that would need to be removed. This needs to be done for treatment and diagnosis. Discussed the procedure and risks of bleeding and infection and anesthetic complications. She understands and agrees to proceed. Current Plans Schedule for Surgery  Excision left breast mass under general anesthesia as an outpatient

## 2017-11-16 NOTE — Progress Notes (Signed)
EKG reviewed by Dr. Lissa Hoard, will proceed with surgery as scheduled.  Ensure pre surgery drink given with instructions to complete by [facility], pt verbalized understanding.

## 2017-11-19 ENCOUNTER — Encounter (HOSPITAL_BASED_OUTPATIENT_CLINIC_OR_DEPARTMENT_OTHER)
Admission: RE | Disposition: A | Discharge status: Home / Self Care | Payer: Self-pay | Source: Ambulatory Visit | Attending: General Surgery

## 2017-11-19 ENCOUNTER — Ambulatory Visit (HOSPITAL_BASED_OUTPATIENT_CLINIC_OR_DEPARTMENT_OTHER): Payer: PPO | Admitting: Anesthesiology

## 2017-11-19 ENCOUNTER — Encounter (HOSPITAL_BASED_OUTPATIENT_CLINIC_OR_DEPARTMENT_OTHER): Payer: Self-pay | Admitting: Anesthesiology

## 2017-11-19 ENCOUNTER — Ambulatory Visit (HOSPITAL_BASED_OUTPATIENT_CLINIC_OR_DEPARTMENT_OTHER)
Admission: RE | Admit: 2017-11-19 | Discharge: 2017-11-19 | Disposition: A | Discharge status: Home / Self Care | Payer: PPO | Source: Ambulatory Visit | Attending: General Surgery | Admitting: General Surgery

## 2017-11-19 DIAGNOSIS — I10 Essential (primary) hypertension: Secondary | ICD-10-CM | POA: Insufficient documentation

## 2017-11-19 DIAGNOSIS — Z791 Long term (current) use of non-steroidal anti-inflammatories (NSAID): Secondary | ICD-10-CM | POA: Diagnosis not present

## 2017-11-19 DIAGNOSIS — Z853 Personal history of malignant neoplasm of breast: Secondary | ICD-10-CM | POA: Diagnosis not present

## 2017-11-19 DIAGNOSIS — Z9221 Personal history of antineoplastic chemotherapy: Secondary | ICD-10-CM | POA: Diagnosis not present

## 2017-11-19 DIAGNOSIS — N6320 Unspecified lump in the left breast, unspecified quadrant: Secondary | ICD-10-CM | POA: Diagnosis not present

## 2017-11-19 DIAGNOSIS — N6489 Other specified disorders of breast: Secondary | ICD-10-CM | POA: Diagnosis not present

## 2017-11-19 DIAGNOSIS — Z87891 Personal history of nicotine dependence: Secondary | ICD-10-CM | POA: Diagnosis not present

## 2017-11-19 DIAGNOSIS — E7800 Pure hypercholesterolemia, unspecified: Secondary | ICD-10-CM | POA: Insufficient documentation

## 2017-11-19 DIAGNOSIS — Z79899 Other long term (current) drug therapy: Secondary | ICD-10-CM | POA: Insufficient documentation

## 2017-11-19 DIAGNOSIS — N6012 Diffuse cystic mastopathy of left breast: Secondary | ICD-10-CM | POA: Diagnosis not present

## 2017-11-19 DIAGNOSIS — Z7984 Long term (current) use of oral hypoglycemic drugs: Secondary | ICD-10-CM | POA: Diagnosis not present

## 2017-11-19 DIAGNOSIS — E119 Type 2 diabetes mellitus without complications: Secondary | ICD-10-CM | POA: Insufficient documentation

## 2017-11-19 DIAGNOSIS — Z7982 Long term (current) use of aspirin: Secondary | ICD-10-CM | POA: Diagnosis not present

## 2017-11-19 DIAGNOSIS — Z923 Personal history of irradiation: Secondary | ICD-10-CM | POA: Insufficient documentation

## 2017-11-19 DIAGNOSIS — N6002 Solitary cyst of left breast: Secondary | ICD-10-CM | POA: Diagnosis not present

## 2017-11-19 HISTORY — PX: MASS EXCISION: SHX2000

## 2017-11-19 HISTORY — DX: Unspecified lump in the left breast, unspecified quadrant: N63.20

## 2017-11-19 LAB — GLUCOSE, CAPILLARY
GLUCOSE-CAPILLARY: 263 mg/dL — AB (ref 65–99)
Glucose-Capillary: 303 mg/dL — ABNORMAL HIGH (ref 65–99)
Glucose-Capillary: 228 mg/dL — ABNORMAL HIGH (ref 65–99)

## 2017-11-19 SURGERY — EXCISION MASS
Anesthesia: General | Site: Breast | Laterality: Left

## 2017-11-19 MED ORDER — GLYCOPYRROLATE 0.2 MG/ML IJ SOLN
INTRAMUSCULAR | Status: DC | PRN
  Administered 2017-11-19: 0.2 mg via INTRAVENOUS

## 2017-11-19 MED ORDER — CHLORHEXIDINE GLUCONATE CLOTH 2 % EX PADS: 6.0000 | MEDICATED_PAD | Freq: Once | CUTANEOUS | Status: DC

## 2017-11-19 MED ORDER — PHENYLEPHRINE HCL 10 MG/ML IJ SOLN
INTRAMUSCULAR | Status: DC | PRN
  Administered 2017-11-19 (×2): 80 ug via INTRAVENOUS

## 2017-11-19 MED ORDER — DEXAMETHASONE SODIUM PHOSPHATE 10 MG/ML IJ SOLN
INTRAMUSCULAR | Status: AC
  Filled 2017-11-19: qty 1

## 2017-11-19 MED ORDER — BACITRACIN ZINC 500 UNIT/GM EX OINT
TOPICAL_OINTMENT | CUTANEOUS | Status: AC
  Filled 2017-11-19: qty 0.9

## 2017-11-19 MED ORDER — GABAPENTIN 300 MG PO CAPS
300.0000 mg | ORAL_CAPSULE | ORAL | Status: AC
  Administered 2017-11-19: 300 mg via ORAL

## 2017-11-19 MED ORDER — MIDAZOLAM HCL 2 MG/2ML IJ SOLN
INTRAMUSCULAR | Status: AC
  Filled 2017-11-19: qty 2

## 2017-11-19 MED ORDER — HYDROMORPHONE HCL 1 MG/ML IJ SOLN: 0.2500 mg | INTRAMUSCULAR | Status: DC | PRN

## 2017-11-19 MED ORDER — DEXAMETHASONE SODIUM PHOSPHATE 4 MG/ML IJ SOLN
INTRAMUSCULAR | Status: DC | PRN
  Administered 2017-11-19: 4 mg via INTRAVENOUS

## 2017-11-19 MED ORDER — BUPIVACAINE-EPINEPHRINE 0.25% -1:200000 IJ SOLN
INTRAMUSCULAR | Status: DC | PRN
  Administered 2017-11-19: 10 mL

## 2017-11-19 MED ORDER — EPHEDRINE SULFATE 50 MG/ML IJ SOLN
INTRAMUSCULAR | Status: DC | PRN
  Administered 2017-11-19 (×4): 10 mg via INTRAVENOUS

## 2017-11-19 MED ORDER — LACTATED RINGERS IV SOLN
INTRAVENOUS | Status: DC
  Administered 2017-11-19: 10 mL/h via INTRAVENOUS
  Administered 2017-11-19: 07:00:00 via INTRAVENOUS

## 2017-11-19 MED ORDER — GABAPENTIN 300 MG PO CAPS
ORAL_CAPSULE | ORAL | Status: AC
  Filled 2017-11-19: qty 1

## 2017-11-19 MED ORDER — ONDANSETRON HCL 4 MG/2ML IJ SOLN
INTRAMUSCULAR | Status: AC
  Filled 2017-11-19: qty 2

## 2017-11-19 MED ORDER — MIDAZOLAM HCL 2 MG/2ML IJ SOLN
1.0000 mg | INTRAMUSCULAR | Status: DC | PRN
  Administered 2017-11-19: 1 mg via INTRAVENOUS

## 2017-11-19 MED ORDER — CEFAZOLIN SODIUM-DEXTROSE 2-4 GM/100ML-% IV SOLN
INTRAVENOUS | Status: AC
  Filled 2017-11-19: qty 100

## 2017-11-19 MED ORDER — SODIUM BICARBONATE 4 % IV SOLN
INTRAVENOUS | Status: AC
  Filled 2017-11-19: qty 5

## 2017-11-19 MED ORDER — PROPOFOL 10 MG/ML IV BOLUS
INTRAVENOUS | Status: DC | PRN
  Administered 2017-11-19: 180 mg via INTRAVENOUS

## 2017-11-19 MED ORDER — CELECOXIB 200 MG PO CAPS
ORAL_CAPSULE | ORAL | Status: AC
  Filled 2017-11-19: qty 1

## 2017-11-19 MED ORDER — TRAMADOL HCL 50 MG PO TABS: 50.0000 mg | ORAL_TABLET | Freq: Four times a day (QID) | ORAL | 1 refills | Status: DC | PRN

## 2017-11-19 MED ORDER — LIDOCAINE HCL (PF) 1 % IJ SOLN
INTRAMUSCULAR | Status: AC
  Filled 2017-11-19: qty 30

## 2017-11-19 MED ORDER — BUPIVACAINE-EPINEPHRINE (PF) 0.25% -1:200000 IJ SOLN
INTRAMUSCULAR | Status: AC
  Filled 2017-11-19: qty 30

## 2017-11-19 MED ORDER — FENTANYL CITRATE (PF) 100 MCG/2ML IJ SOLN
50.0000 ug | INTRAMUSCULAR | Status: DC | PRN
  Administered 2017-11-19: 50 ug via INTRAVENOUS

## 2017-11-19 MED ORDER — SCOPOLAMINE 1 MG/3DAYS TD PT72: 1.0000 | MEDICATED_PATCH | Freq: Once | TRANSDERMAL | Status: DC | PRN

## 2017-11-19 MED ORDER — CELECOXIB 200 MG PO CAPS
200.0000 mg | ORAL_CAPSULE | ORAL | Status: AC
  Administered 2017-11-19: 200 mg via ORAL

## 2017-11-19 MED ORDER — ACETAMINOPHEN 500 MG PO TABS
ORAL_TABLET | ORAL | Status: AC
  Filled 2017-11-19: qty 2

## 2017-11-19 MED ORDER — FENTANYL CITRATE (PF) 100 MCG/2ML IJ SOLN: 25.0000 ug | INTRAMUSCULAR | Status: DC | PRN

## 2017-11-19 MED ORDER — ACETAMINOPHEN 500 MG PO TABS
1000.0000 mg | ORAL_TABLET | ORAL | Status: AC
  Administered 2017-11-19: 1000 mg via ORAL

## 2017-11-19 MED ORDER — PROPOFOL 10 MG/ML IV BOLUS
INTRAVENOUS | Status: AC
  Filled 2017-11-19: qty 20

## 2017-11-19 MED ORDER — CEFAZOLIN SODIUM-DEXTROSE 2-4 GM/100ML-% IV SOLN
2.0000 g | INTRAVENOUS | Status: AC
  Administered 2017-11-19: 2 g via INTRAVENOUS

## 2017-11-19 MED ORDER — ONDANSETRON HCL 4 MG/2ML IJ SOLN
INTRAMUSCULAR | Status: DC | PRN
  Administered 2017-11-19: 4 mg via INTRAVENOUS

## 2017-11-19 MED ORDER — LIDOCAINE HCL (CARDIAC) PF 100 MG/5ML IV SOSY
PREFILLED_SYRINGE | INTRAVENOUS | Status: DC | PRN
  Administered 2017-11-19: 60 mg via INTRAVENOUS

## 2017-11-19 MED ORDER — FENTANYL CITRATE (PF) 100 MCG/2ML IJ SOLN
INTRAMUSCULAR | Status: AC
  Filled 2017-11-19: qty 2

## 2017-11-19 SURGICAL SUPPLY — 65 items
ADH SKN CLS APL DERMABOND .7 (GAUZE/BANDAGES/DRESSINGS) ×1
APL SKNCLS STERI-STRIP NONHPOA (GAUZE/BANDAGES/DRESSINGS)
BENZOIN TINCTURE PRP APPL 2/3 (GAUZE/BANDAGES/DRESSINGS) IMPLANT
BLADE CLIPPER SURG (BLADE) IMPLANT
BLADE SURG 15 STRL LF DISP TIS (BLADE) ×1 IMPLANT
BLADE SURG 15 STRL SS (BLADE) ×3
CANISTER SUCT 1200ML W/VALVE (MISCELLANEOUS) IMPLANT
CHLORAPREP W/TINT 26ML (MISCELLANEOUS) ×3 IMPLANT
CLIP VESOCCLUDE SM WIDE 6/CT (CLIP) ×1 IMPLANT
CLOSURE WOUND 1/2 X4 (GAUZE/BANDAGES/DRESSINGS)
CLOSURE WOUND 1/4X4 (GAUZE/BANDAGES/DRESSINGS)
COVER BACK TABLE 60X90IN (DRAPES) ×3 IMPLANT
COVER MAYO STAND STRL (DRAPES) ×3 IMPLANT
DECANTER SPIKE VIAL GLASS SM (MISCELLANEOUS) IMPLANT
DERMABOND ADVANCED (GAUZE/BANDAGES/DRESSINGS) ×2
DERMABOND ADVANCED .7 DNX12 (GAUZE/BANDAGES/DRESSINGS) IMPLANT
DRAIN CHANNEL 7F FF FLAT (WOUND CARE) IMPLANT
DRAPE LAPAROTOMY 100X72 PEDS (DRAPES) ×3 IMPLANT
DRAPE UTILITY XL STRL (DRAPES) ×3 IMPLANT
ELECT COATED BLADE 2.86 ST (ELECTRODE) ×3 IMPLANT
ELECT REM PT RETURN 9FT ADLT (ELECTROSURGICAL) ×3
ELECTRODE REM PT RTRN 9FT ADLT (ELECTROSURGICAL) ×1 IMPLANT
EVACUATOR SILICONE 100CC (DRAIN) IMPLANT
GAUZE SPONGE 4X4 12PLY STRL LF (GAUZE/BANDAGES/DRESSINGS) IMPLANT
GLOVE BIO SURGEON STRL SZ 6.5 (GLOVE) ×1 IMPLANT
GLOVE BIO SURGEON STRL SZ7 (GLOVE) ×2 IMPLANT
GLOVE BIO SURGEONS STRL SZ 6.5 (GLOVE) ×1
GLOVE BIOGEL PI IND STRL 6.5 (GLOVE) IMPLANT
GLOVE BIOGEL PI IND STRL 8 (GLOVE) ×1 IMPLANT
GLOVE BIOGEL PI INDICATOR 6.5 (GLOVE) ×4
GLOVE BIOGEL PI INDICATOR 8 (GLOVE) ×2
GLOVE ECLIPSE 7.5 STRL STRAW (GLOVE) ×3 IMPLANT
GOWN STRL REUS W/ TWL LRG LVL3 (GOWN DISPOSABLE) ×1 IMPLANT
GOWN STRL REUS W/ TWL XL LVL3 (GOWN DISPOSABLE) ×1 IMPLANT
GOWN STRL REUS W/TWL LRG LVL3 (GOWN DISPOSABLE) ×3
GOWN STRL REUS W/TWL XL LVL3 (GOWN DISPOSABLE) ×6
NDL HYPO 25X1 1.5 SAFETY (NEEDLE) ×1 IMPLANT
NDL HYPO 30GX1 BEV (NEEDLE) IMPLANT
NEEDLE HYPO 25X1 1.5 SAFETY (NEEDLE) ×3 IMPLANT
NEEDLE HYPO 30GX1 BEV (NEEDLE) IMPLANT
NS IRRIG 1000ML POUR BTL (IV SOLUTION) IMPLANT
PACK BASIN DAY SURGERY FS (CUSTOM PROCEDURE TRAY) ×3 IMPLANT
PENCIL BUTTON HOLSTER BLD 10FT (ELECTRODE) ×3 IMPLANT
SLEEVE SCD COMPRESS KNEE MED (MISCELLANEOUS) ×3 IMPLANT
SPONGE LAP 4X18 RFD (DISPOSABLE) ×3 IMPLANT
STRIP CLOSURE SKIN 1/2X4 (GAUZE/BANDAGES/DRESSINGS) IMPLANT
STRIP CLOSURE SKIN 1/4X4 (GAUZE/BANDAGES/DRESSINGS) IMPLANT
SUT ETHILON 3 0 PS 1 (SUTURE) IMPLANT
SUT ETHILON 4 0 PS 2 18 (SUTURE) ×2 IMPLANT
SUT ETHILON 5 0 P 3 18 (SUTURE)
SUT ETHILON 5 0 PS 2 18 (SUTURE) IMPLANT
SUT MNCRL AB 4-0 PS2 18 (SUTURE) ×1 IMPLANT
SUT NYLON ETHILON 5-0 P-3 1X18 (SUTURE) IMPLANT
SUT VIC AB 3-0 54X BRD REEL (SUTURE) IMPLANT
SUT VIC AB 3-0 BRD 54 (SUTURE)
SUT VIC AB 3-0 SH 27 (SUTURE)
SUT VIC AB 3-0 SH 27X BRD (SUTURE) IMPLANT
SUT VICRYL 3-0 CR8 SH (SUTURE) ×3 IMPLANT
SUT VICRYL 4-0 PS2 18IN ABS (SUTURE) IMPLANT
SYR CONTROL 10ML LL (SYRINGE) ×3 IMPLANT
TOWEL OR 17X24 6PK STRL BLUE (TOWEL DISPOSABLE) ×6 IMPLANT
TOWEL OR NON WOVEN STRL DISP B (DISPOSABLE) ×3 IMPLANT
TUBE CONNECTING 20'X1/4 (TUBING)
TUBE CONNECTING 20X1/4 (TUBING) IMPLANT
YANKAUER SUCT BULB TIP NO VENT (SUCTIONS) IMPLANT

## 2017-11-19 NOTE — Transfer of Care (Signed)
Immediate Anesthesia Transfer of Care Note  Patient: Ashley Sloan  Procedure(s) Performed: EXCISION LEFT BREAST  MASS ERAS PATHWAY (Left Breast)  Patient Location: PACU  Anesthesia Type:General  Level of Consciousness: sedated  Airway & Oxygen Therapy: Patient Spontanous Breathing and Patient connected to face mask oxygen  Post-op Assessment: Report given to RN and Post -op Vital signs reviewed and stable  Post vital signs: Reviewed and stable  Last Vitals:  Vitals Value Taken Time  BP 95/52 11/19/2017  8:10 AM  Temp    Pulse 73 11/19/2017  8:11 AM  Resp 16 11/19/2017  8:11 AM  SpO2 94 % 11/19/2017  8:11 AM  Vitals shown include unvalidated device data.  Last Pain:  Vitals:   11/19/17 0648  TempSrc: Oral  PainSc: 0-No pain         Complications: No apparent anesthesia complications

## 2017-11-19 NOTE — Discharge Instructions (Signed)
°Post Anesthesia Home Care Instructions ° °Activity: °Get plenty of rest for the remainder of the day. A responsible individual must stay with you for 24 hours following the procedure.  °For the next 24 hours, DO NOT: °-Drive a car °-Operate machinery °-Drink alcoholic beverages °-Take any medication unless instructed by your physician °-Make any legal decisions or sign important papers. ° °Meals: °Start with liquid foods such as gelatin or soup. Progress to regular foods as tolerated. Avoid greasy, spicy, heavy foods. If nausea and/or vomiting occur, drink only clear liquids until the nausea and/or vomiting subsides. Call your physician if vomiting continues. ° °Special Instructions/Symptoms: °Your throat may feel dry or sore from the anesthesia or the breathing tube placed in your throat during surgery. If this causes discomfort, gargle with warm salt water. The discomfort should disappear within 24 hours. ° °If you had a scopolamine patch placed behind your ear for the management of post- operative nausea and/or vomiting: ° °1. The medication in the patch is effective for 72 hours, after which it should be removed.  Wrap patch in a tissue and discard in the trash. Wash hands thoroughly with soap and water. °2. You may remove the patch earlier than 72 hours if you experience unpleasant side effects which may include dry mouth, dizziness or visual disturbances. °3. Avoid touching the patch. Wash your hands with soap and water after contact with the patch. °  ° ° ° ° °[facility] °Office Phone Number [phone] ° °BREAST BIOPSY/ PARTIAL MASTECTOMY: POST OP INSTRUCTIONS ° °Always review your discharge instruction sheet given to you by the facility where your surgery was performed. ° °IF YOU HAVE DISABILITY OR FAMILY LEAVE FORMS, YOU MUST BRING THEM TO THE OFFICE FOR PROCESSING.  DO NOT GIVE THEM TO YOUR DOCTOR. ° °1. A prescription for pain medication may be given to you upon discharge.  Take your  pain medication as prescribed, if needed.  If narcotic pain medicine is not needed, then you may take acetaminophen (Tylenol) or ibuprofen (Advil) as needed. °2. Take your usually prescribed medications unless otherwise directed °3. If you need a refill on your pain medication, please contact your pharmacy.  They will contact our office to request authorization.  Prescriptions will not be filled after 5pm or on week-ends. °4. You should eat very light the first 24 hours after surgery, such as soup, crackers, pudding, etc.  Resume your normal diet the day after surgery. °5. Most patients will experience some swelling and bruising in the breast.  Ice packs and a good support bra will help.  Swelling and bruising can take several days to resolve.  °6. It is common to experience some constipation if taking pain medication after surgery.  Increasing fluid intake and taking a stool softener will usually help or prevent this problem from occurring.  A mild laxative (Milk of Magnesia or Miralax) should be taken according to package directions if there are no bowel movements after 48 hours. °7. Unless discharge instructions indicate otherwise, you may remove your bandages 24-48 hours after surgery, and you may shower at that time.  You may have steri-strips (small skin tapes) in place directly over the incision.  These strips should be left on the skin for 7-10 days.  If your surgeon used skin glue on the incision, you may shower in 24 hours.  The glue will flake off over the next 2-3 weeks.  Any sutures or staples will be removed at the office during your follow-up visit. °  8. ACTIVITIES:  You may resume regular daily activities (gradually increasing) beginning the next day.  Wearing a good support bra or sports bra minimizes pain and swelling.  You may have sexual intercourse when it is comfortable. °a. You may drive when you no longer are taking prescription pain medication, you can comfortably wear a seatbelt, and you can  safely maneuver your car and apply brakes. °b. RETURN TO WORK:  ______________________________________________________________________________________ °9. You should see your doctor in the office for a follow-up appointment approximately two weeks after your surgery.  Your doctor’s nurse will typically make your follow-up appointment when she calls you with your pathology report.  Expect your pathology report 2-3 business days after your surgery.  You may call to check if you do not hear from us after three days. °10. OTHER INSTRUCTIONS: _______________________________________________________________________________________________ _____________________________________________________________________________________________________________________________________ °_____________________________________________________________________________________________________________________________________ °_____________________________________________________________________________________________________________________________________ ° °WHEN TO CALL YOUR DOCTOR: °1. Fever over 101.0 °2. Nausea and/or vomiting. °3. Extreme swelling or bruising. °4. Continued bleeding from incision. °5. Increased pain, redness, or drainage from the incision. ° °The clinic staff is available to answer your questions during regular business hours.  Please don’t hesitate to call and ask to speak to one of the nurses for clinical concerns.  If you have a medical emergency, go to the nearest emergency room or call 911.  A surgeon from [facility] is always on call at the hospital. ° °For further questions, please visit [email or web address]  °

## 2017-11-19 NOTE — Anesthesia Procedure Notes (Addendum)
Procedure Name: LMA Insertion Date/Time: 11/19/2017 7:31 AM Performed by: Maryella Shivers, CRNA Pre-anesthesia Checklist: Patient identified, Emergency Drugs available, Suction available and Patient being monitored Patient Re-evaluated:Patient Re-evaluated prior to induction Oxygen Delivery Method: Circle system utilized Preoxygenation: Pre-oxygenation with 100% oxygen Induction Type: IV induction Ventilation: Mask ventilation without difficulty LMA: LMA inserted LMA Size: 4.0 Number of attempts: 1 Airway Equipment and Method: Bite block Placement Confirmation: positive ETCO2 Tube secured with: Tape Dental Injury: Teeth and Oropharynx as per pre-operative assessment

## 2017-11-19 NOTE — Anesthesia Preprocedure Evaluation (Signed)
Anesthesia Evaluation  Patient identified by MRN, date of birth, ID band Patient awake    Reviewed: Allergy & Precautions, NPO status , Patient's Chart, lab work & pertinent test results  Airway Mallampati: II  TM Distance: >3 FB     Dental   Pulmonary former smoker,    breath sounds clear to auscultation       Cardiovascular hypertension,  Rhythm:Regular Rate:Normal     Neuro/Psych    GI/Hepatic negative GI ROS, Neg liver ROS,   Endo/Other  diabetes  Renal/GU      Musculoskeletal   Abdominal   Peds  Hematology   Anesthesia Other Findings   Reproductive/Obstetrics                             Anesthesia Physical Anesthesia Plan  ASA: III  Anesthesia Plan: General   Post-op Pain Management:    Induction: Intravenous  PONV Risk Score and Plan:   Airway Management Planned: LMA  Additional Equipment:   Intra-op Plan:   Post-operative Plan: Extubation in OR  Informed Consent: I have reviewed the patients History and Physical, chart, labs and discussed the procedure including the risks, benefits and alternatives for the proposed anesthesia with the patient or authorized representative who has indicated his/her understanding and acceptance.   Dental advisory given  Plan Discussed with: CRNA and Anesthesiologist  Anesthesia Plan Comments:         Anesthesia Quick Evaluation

## 2017-11-19 NOTE — Op Note (Signed)
Preoperative Diagnosis: Left breast mass  Postoprative Diagnosis: Left breast mass  Procedure: Procedure(s): EXCISION LEFT BREAST  MASS ERAS PATHWAY   Surgeon: Excell Seltzer T   Assistants: None  Anesthesia:  General LMA anesthesia  Indications: Unusual appearing exophytic mass measuring approximately 3 cm involving skin and probably subcutaneous of the left breast at apparently previous lumpectomy site with history of lumpectomy and radiation and chemotherapy 1991. This may be an unusual keloid or calcified sebaceous cyst. Cannot rule out malignancy.  After discussion with the patient detailed elsewhere we have elected to proceed with excision under general anesthesia as an outpatient.      Procedure Detail: Patient was brought to the operating room, placed in the supine position on the operating table and laryngeal mask general anesthesia induced.  The left breast was widely sterilely prepped and draped.  She received preoperative IV antibiotics.  PAS were in place.  Patient timeout was performed and correct procedure verified.  I planned a transverse elliptical incision encompassing the firm mass and thickened tissue out to normal skin.  This was about a 5-1/2 cm incision.  The incision was sharply made down into the subcutaneous tissue.  The mass which extended into the superficial subcutaneous tissue but not deeply into the breast was then completely excised with cautery and sent for permanent pathology.  Hemostasis was obtained with cautery.  Soft tissue was infiltrated with Marcaine.  The breast is some cutaneous tissue was closed with interrupted 3-0 Vicryl and the skin with running 4-0 nylon.  Sponge needle instrument counts were correct.    Findings: As above  Estimated Blood Loss:  Minimal         Drains: None  Blood Given: none          Specimens: Left breast skin, subcutaneous and breast tissue        Complications:  * No complications entered in OR log *          Disposition: PACU - hemodynamically stable.         Condition: stable

## 2017-11-19 NOTE — Anesthesia Postprocedure Evaluation (Signed)
Anesthesia Post Note  Patient: Ashley Sloan  Procedure(s) Performed: EXCISION LEFT BREAST  MASS ERAS PATHWAY (Left Breast)     Patient location during evaluation: PACU Anesthesia Type: General Level of consciousness: awake Pain management: pain level controlled Vital Signs Assessment: post-procedure vital signs reviewed and stable Respiratory status: spontaneous breathing Cardiovascular status: stable Anesthetic complications: no    Last Vitals:  Vitals:   11/19/17 0810 11/19/17 0815  BP: (!) 95/52 (!) 95/56  Pulse: 72 73  Resp: 11 16  Temp: 36.4 C   SpO2: 93% 97%    Last Pain:  Vitals:   11/19/17 0648  TempSrc: Oral  PainSc: 0-No pain                 Sharlotte Baka

## 2017-11-19 NOTE — Interval H&P Note (Signed)
History and Physical Interval Note:  11/19/2017 8:01 AM  Ashley Sloan  has presented today for surgery, with the diagnosis of Left breast mass  The various methods of treatment have been discussed with the patient and family. After consideration of risks, benefits and other options for treatment, the patient has consented to  Procedure(s): EXCISION LEFT [place] (Left) as a surgical intervention .  The patient's history has been reviewed, patient examined, no change in status, stable for surgery.  I have reviewed the patient's chart and labs.  Questions were answered to the patient's satisfaction.     Darene Lamer Leeman [name]

## 2017-11-20 ENCOUNTER — Encounter (HOSPITAL_BASED_OUTPATIENT_CLINIC_OR_DEPARTMENT_OTHER): Payer: Self-pay | Admitting: General Surgery

## 2017-12-03 ENCOUNTER — Other Ambulatory Visit: Payer: Self-pay | Admitting: Family Medicine

## 2017-12-03 DIAGNOSIS — I10 Essential (primary) hypertension: Secondary | ICD-10-CM

## 2017-12-14 ENCOUNTER — Other Ambulatory Visit: Payer: Self-pay

## 2017-12-14 DIAGNOSIS — I10 Essential (primary) hypertension: Secondary | ICD-10-CM

## 2017-12-14 MED ORDER — CARVEDILOL 25 MG PO TABS: 25.0000 mg | ORAL_TABLET | Freq: Two times a day (BID) | ORAL | 3 refills | Status: DC

## 2017-12-29 ENCOUNTER — Other Ambulatory Visit: Payer: Self-pay | Admitting: Family Medicine

## 2017-12-29 DIAGNOSIS — E785 Hyperlipidemia, unspecified: Secondary | ICD-10-CM

## 2018-01-02 DIAGNOSIS — D126 Benign neoplasm of colon, unspecified: Secondary | ICD-10-CM | POA: Diagnosis not present

## 2018-01-02 DIAGNOSIS — Z1211 Encounter for screening for malignant neoplasm of colon: Secondary | ICD-10-CM | POA: Diagnosis not present

## 2018-01-02 DIAGNOSIS — K5730 Diverticulosis of large intestine without perforation or abscess without bleeding: Secondary | ICD-10-CM | POA: Diagnosis not present

## 2018-01-08 DIAGNOSIS — D126 Benign neoplasm of colon, unspecified: Secondary | ICD-10-CM | POA: Diagnosis not present

## 2018-01-09 ENCOUNTER — Other Ambulatory Visit: Payer: Self-pay | Admitting: Family Medicine

## 2018-01-09 DIAGNOSIS — E11618 Type 2 diabetes mellitus with other diabetic arthropathy: Secondary | ICD-10-CM

## 2018-01-09 DIAGNOSIS — E785 Hyperlipidemia, unspecified: Secondary | ICD-10-CM

## 2018-01-23 ENCOUNTER — Other Ambulatory Visit: Payer: Self-pay | Admitting: *Deleted

## 2018-01-23 DIAGNOSIS — E785 Hyperlipidemia, unspecified: Secondary | ICD-10-CM

## 2018-01-23 DIAGNOSIS — E11618 Type 2 diabetes mellitus with other diabetic arthropathy: Secondary | ICD-10-CM

## 2018-01-23 MED ORDER — METFORMIN HCL 1000 MG PO TABS: 1000.0000 mg | ORAL_TABLET | Freq: Two times a day (BID) | ORAL | 11 refills | Status: DC

## 2018-01-23 MED ORDER — ATORVASTATIN CALCIUM 40 MG PO TABS: 40.0000 mg | ORAL_TABLET | Freq: Every day | ORAL | 3 refills | Status: DC

## 2018-01-28 ENCOUNTER — Other Ambulatory Visit: Payer: Self-pay | Admitting: Family Medicine

## 2018-01-28 DIAGNOSIS — I10 Essential (primary) hypertension: Secondary | ICD-10-CM

## 2018-02-15 ENCOUNTER — Other Ambulatory Visit: Payer: Self-pay | Admitting: Family Medicine

## 2018-02-15 DIAGNOSIS — I10 Essential (primary) hypertension: Secondary | ICD-10-CM

## 2018-02-15 NOTE — Telephone Encounter (Signed)
Refill given, please have patient schedule follow up appointment for HTN and diabetes. Thanks!

## 2018-02-18 NOTE — Telephone Encounter (Signed)
LM for patient to call back. Jazmin Hartsell,CMA  

## 2018-02-18 NOTE — Telephone Encounter (Signed)
Patient scheduled appt for 03-08-18. Jazmin Hartsell,CMA

## 2018-03-06 NOTE — Progress Notes (Signed)
Subjective:   Patient ID: Ashley Sloan    DOB: [birth year], 67 y.o. female   MRN: [id number]  Ashley Sloan is a 67 y.o. female with a history of HTN, DM2, HLD, gout here for   Diabetes, Type 2 - Last A1c 7.6 10/2017 - Medications: metformin 1000mg  BID - Compliance: good - Checking BG at home: no - Eye exam: due - Foot exam: UTD - PNA vaccine: UTD - Microalbumin: N/A - Denies symptoms of hypoglycemia, numbness extremities, foot ulcers/trauma - endorsing polyuria, polydipsia.  Hypertension: - Medications: norvasc 10mg , coreg 25mg , lisinopril-HCTZ 20-12.5mg  - Compliance: good - Checking BP at home: no - Denies any medication SEs. States will occasionally get lightheaded. Doesn't know if related to positional changes.  Healthcare Maintenance - Vaccines: due for flu, tetanus - Colonoscopy: received few months ago, will obtain records - Mammogram: due - Pap Smear: N/A - DEXA Scan: due - A1c: due - Lipid Panel: UTD  ABDOMINAL PAIN Pain began few weeks ago, comes and goes. Sharp sometimes, then dull sometimes. Started out as gas pain then progressively worsened. Generalized location with LLQ the worst. States Wednesday ate some chicken and noodles then had diarrhea a few hours later. No pain with defection. Pain not relieved with defecation.  Medications tried: alka seltzer, pepto bismol. Helps a little. Takes 81mg  ASA, no other NSAIDs. Has been taking medications on an empty stomach but not consistently. Similar pain before: no Prior abdominal surgeries: no  Symptoms Nausea/vomiting: no Diarrhea: yes Constipation: a couple of weeks ago, comes and goes Blood in stool: no Blood in vomit: N/A Fever: no Dysuria: no Loss of appetite: yes, "doesn't have a taste for food" but has been eating normally Early satiety: yes within the last 2 weeks. Weight loss: lost 5 lbs since May although weighed less in April.  Vaginal Bleeding: no Missed menstrual period:  post-menopausal.  Review of Systems:  Per HPI.  [place], medications and smoking status reviewed.  Objective:   BP 110/64   Pulse 60   Temp 97.8 F (36.6 C) (Oral)   Ht 5' (1.524 m)   Wt 165 lb (74.8 kg)   SpO2 99%   BMI 32.22 kg/m  Vitals and nursing note reviewed.  General: overweight elderly female, in no acute distress with non-toxic appearance CV: regular rate and rhythm without murmurs, rubs, or gallops, no lower extremity edema Lungs: clear to auscultation bilaterally with normal work of breathing Abdomen: soft, TTP diffusely, non-distended, no masses or organomegaly palpable, normoactive bowel sounds Skin: warm, dry, no rashes or lesions Extremities: warm and well perfused, normal tone MSK: ROM grossly intact, strength intact, gait normal Neuro: Alert and oriented, speech normal  Assessment & Plan:   Diabetes mellitus, type 2 (HCC) Well controlled on current regimen. No changes made today.  HTN (hypertension), benign Low normal BP today, 110/64. Some lightheadedness at home although doesn't check BP. Goal <140/<90. Will decrease amlodipine dose to 5mg .   Abdominal pain Generalized with LLQ worst. Unclear etiology. Not consistent with diverticulitis as pain generalized and not severe with no localization on exam. Not consistent with peptic ulcer vs GERD given symptomatology. Some concern for cancer given age although with recent colonoscopy. Will obtain records. Instructed to keep a log of symptoms to see if any correlation with food. Instructed to use imodium PRN for diarrhea and to follow up if no better. Return precautions reviewed.  Orders Placed This Encounter  Procedures  . DG Bone Density    Standing Status:  Future    Standing Expiration Date:   05/07/2019    Order Specific Question:   Reason for Exam (SYMPTOM  OR DIAGNOSIS REQUIRED)    Answer:   estrogen deficiency    Order Specific Question:   Preferred imaging location?    Answer:   [facility]  .  MM DIGITAL SCREENING BILATERAL    Standing Status:   Future    Standing Expiration Date:   03/07/2019    Order Specific Question:   Reason for Exam (SYMPTOM  OR DIAGNOSIS REQUIRED)    Answer:   screening for breast cancer    Order Specific Question:   Preferred imaging location?    Answer:   [facility]  . Flu Vaccine QUAD 36+ mos IM  . Ambulatory referral to Ophthalmology    Referral Priority:   Routine    Referral Type:   Consultation    Referral Reason:   Specialty Services Required    Requested Specialty:   Ophthalmology    Number of Visits Requested:   1  . HgB A1c   Meds ordered this encounter  Medications  . Tdap (ADACEL) 11-01-13.5 LF-MCG/0.5 injection    Sig: Inject 0.5 mLs into the muscle once for 1 dose.    Dispense:  0.5 mL    Refill:  0  . amLODipine (NORVASC) 5 MG tablet    Sig: Take 1 tablet (5 mg total) by mouth daily.    Dispense:  90 tablet    Refill:  [place], DO PGY-2, [facility] 03/08/2018 12:37 PM

## 2018-03-08 ENCOUNTER — Ambulatory Visit (INDEPENDENT_AMBULATORY_CARE_PROVIDER_SITE_OTHER): Payer: PPO | Admitting: Family Medicine

## 2018-03-08 ENCOUNTER — Other Ambulatory Visit: Payer: Self-pay | Admitting: Family Medicine

## 2018-03-08 ENCOUNTER — Encounter: Payer: Self-pay | Admitting: Family Medicine

## 2018-03-08 ENCOUNTER — Other Ambulatory Visit: Payer: Self-pay

## 2018-03-08 VITALS — BP 110/64 | HR 60 | Temp 97.8°F | Ht 60.0 in | Wt 165.0 lb

## 2018-03-08 DIAGNOSIS — R1084 Generalized abdominal pain: Secondary | ICD-10-CM | POA: Diagnosis not present

## 2018-03-08 DIAGNOSIS — R109 Unspecified abdominal pain: Secondary | ICD-10-CM | POA: Insufficient documentation

## 2018-03-08 DIAGNOSIS — Z23 Encounter for immunization: Secondary | ICD-10-CM

## 2018-03-08 DIAGNOSIS — Z1231 Encounter for screening mammogram for malignant neoplasm of breast: Secondary | ICD-10-CM

## 2018-03-08 DIAGNOSIS — E2839 Other primary ovarian failure: Secondary | ICD-10-CM

## 2018-03-08 DIAGNOSIS — I10 Essential (primary) hypertension: Secondary | ICD-10-CM | POA: Diagnosis not present

## 2018-03-08 DIAGNOSIS — E11618 Type 2 diabetes mellitus with other diabetic arthropathy: Secondary | ICD-10-CM

## 2018-03-08 LAB — POCT GLYCOSYLATED HEMOGLOBIN (HGB A1C): HbA1c, POC (controlled diabetic range): 7.4 % — AB (ref 0.0–7.0)

## 2018-03-08 MED ORDER — TETANUS-DIPHTH-ACELL PERTUSSIS 5-2-15.5 LF-MCG/0.5 IM SUSP: 0.5000 mL | Freq: Once | INTRAMUSCULAR | 0 refills | Status: AC

## 2018-03-08 MED ORDER — AMLODIPINE BESYLATE 5 MG PO TABS
5.0000 mg | ORAL_TABLET | Freq: Every day | ORAL | 1 refills | Status: DC
Start: 2018-03-08 — End: 2018-10-09

## 2018-03-08 NOTE — Assessment & Plan Note (Signed)
Well-controlled on current regimen.  No changes made today. 

## 2018-03-08 NOTE — Assessment & Plan Note (Signed)
Low normal BP today, 110/64. Some lightheadedness at home although doesn't check BP. Goal <140/<90. Will decrease amlodipine dose to 5mg .

## 2018-03-08 NOTE — Patient Instructions (Signed)
It was great to see you!  Our plans for today:  - Your diabetes is well controlled - no changes today. - Your blood pressure is low normal. With your intermittent lightheadedness, I will decrease your amlodipine dose to 5mg . - For your abdominal pain, you can try imodium for diarrhea. If you develop fevers or your pain worsens, come back to the clinic to be evaluated.  Take care and seek immediate care sooner if you develop any concerns.   Dr. [facility]

## 2018-03-08 NOTE — Assessment & Plan Note (Signed)
Generalized with LLQ worst. Unclear etiology. Not consistent with diverticulitis as pain generalized and not severe with no localization on exam. Not consistent with peptic ulcer vs GERD given symptomatology. Some concern for cancer given age although with recent colonoscopy. Will obtain records. Instructed to keep a log of symptoms to see if any correlation with food. Instructed to use imodium PRN for diarrhea and to follow up if no better. Return precautions reviewed.

## 2018-05-10 ENCOUNTER — Ambulatory Visit
Admission: RE | Admit: 2018-05-10 | Discharge: 2018-05-10 | Disposition: A | Discharge status: Home / Self Care | Payer: PPO | Source: Ambulatory Visit | Attending: Family Medicine | Admitting: Family Medicine

## 2018-05-10 DIAGNOSIS — Z780 Asymptomatic menopausal state: Secondary | ICD-10-CM | POA: Diagnosis not present

## 2018-05-10 DIAGNOSIS — E2839 Other primary ovarian failure: Secondary | ICD-10-CM

## 2018-05-10 DIAGNOSIS — Z1231 Encounter for screening mammogram for malignant neoplasm of breast: Secondary | ICD-10-CM | POA: Diagnosis not present

## 2018-05-10 DIAGNOSIS — Z13820 Encounter for screening for osteoporosis: Secondary | ICD-10-CM | POA: Diagnosis not present

## 2018-05-16 ENCOUNTER — Other Ambulatory Visit: Payer: Self-pay | Admitting: Family Medicine

## 2018-05-16 DIAGNOSIS — I10 Essential (primary) hypertension: Secondary | ICD-10-CM

## 2018-05-17 NOTE — Telephone Encounter (Signed)
Refill provided. Have patient come by for a nurse visit to check her blood pressure given med changes at last visit.

## 2018-05-24 ENCOUNTER — Ambulatory Visit: Payer: PPO | Admitting: Podiatry

## 2018-05-24 ENCOUNTER — Encounter: Payer: Self-pay | Admitting: Podiatry

## 2018-05-24 VITALS — BP 170/82 | HR 78 | Resp 16

## 2018-05-24 DIAGNOSIS — B351 Tinea unguium: Secondary | ICD-10-CM

## 2018-05-24 DIAGNOSIS — M79675 Pain in left toe(s): Secondary | ICD-10-CM | POA: Diagnosis not present

## 2018-05-24 DIAGNOSIS — E1121 Type 2 diabetes mellitus with diabetic nephropathy: Secondary | ICD-10-CM | POA: Diagnosis not present

## 2018-05-24 DIAGNOSIS — M79674 Pain in right toe(s): Secondary | ICD-10-CM

## 2018-05-24 DIAGNOSIS — Q828 Other specified congenital malformations of skin: Secondary | ICD-10-CM | POA: Diagnosis not present

## 2018-05-24 NOTE — Patient Instructions (Signed)

## 2018-05-24 NOTE — Progress Notes (Signed)
Subjective:   Patient ID: Ashley Sloan, female   DOB: 66 y.o.   MRN: [id number]   HPI 67 year old female presents the office today for concerns of thick, discolored, elongated toenails that she cannot trim herself.  Also she has corn to bilateral fifth toes that she wants have trimmed as they are painful.  She is tried to wear shoes that do not rub.  Denies any open sores and denies any redness or drainage or any swelling.  She has no other concerns today.  Last A1c was 7.4. Denies any numbness or tingling or any claudication symptoms.   Review of Systems  All other systems reviewed and are negative.  Past Medical History:  Diagnosis Date  . 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 Diabetes mellitus   . Hyperlipidemia   . Hypertension   . Left breast mass     Past Surgical History:  Procedure Laterality Date  . BREAST EXCISIONAL BIOPSY Left    benign  . BREAST LUMPECTOMY Left 1991  . BREAST SURGERY  1991   Lumpectomy  . [place]  . MASS EXCISION Left 11/19/2017   Procedure: EXCISION LEFT BREAST  MASS ERAS PATHWAY;  Surgeon: Excell Seltzer, MD;  Location: [facility];  Service: General;  Laterality: Left;     Current Outpatient Medications:  .  Calcium-Magnesium-Vitamin D (CALCIUM 500 PO), Take by mouth., Disp: , Rfl:  .  acetaminophen (TYLENOL) 500 MG tablet, Take 1,000 mg by mouth every 6 (six) hours as needed for mild pain. Reported on 08/19/2015, Disp: , Rfl:  .  amLODipine (NORVASC) 5 MG tablet, Take 1 tablet (5 mg total) by mouth daily., Disp: 90 tablet, Rfl: 1 .  aspirin 81 MG tablet, Take 81 mg by mouth daily., Disp: , Rfl:  .  atorvastatin (LIPITOR) 40 MG tablet, Take 1 tablet (40 mg total) by mouth daily., Disp: 90 tablet, Rfl: 3 .  carvedilol (COREG) 25 MG tablet, Take 1 tablet (25 mg total) by mouth 2 (two) times daily with a meal., Disp: 180 tablet, Rfl: 3 .  diclofenac sodium (VOLTAREN) 1 % GEL, Apply 2 g topically 4 (four) times  daily., Disp: 100 g, Rfl: 2 .  lisinopril-hydrochlorothiazide (PRINZIDE,ZESTORETIC) 20-12.5 MG tablet, TAKE 2 TABLETS BY MOUTH ONCE DAILY, Disp: 180 tablet, Rfl: 3 .  metFORMIN (GLUCOPHAGE) 1000 MG tablet, Take 1 tablet (1,000 mg total) by mouth 2 (two) times daily with a meal., Disp: 60 tablet, Rfl: 11  No Known Allergies       Objective:  Physical Exam  General: AAO x3, NAD  Dermatological:Nails are hypertrophic, dystrophic, brittle, discolored, elongated 10. No surrounding redness or drainage. Tenderness nails 1-5 bilaterally.  Hyperkeratotic lesions bilateral fifth toes.  Upon debridement there is no underlying ulceration, drainage or any signs of infection.  No open lesions or other pre-ulcerative lesions are identified today.  Vascular: Dorsalis Pedis artery and Posterior Tibial artery pedal pulses are 2/4 bilateral with immedate capillary fill time.  There is no pain with calf compression, swelling, warmth, erythema.   Neruologic: Grossly intact via light touch bilateral.Protective threshold with Semmes Wienstein monofilament intact to all pedal sites bilateral.   Musculoskeletal: Adductovarus of the fifth toes present. <uscular strength 5/5 in all groups tested bilateral.  Gait: Unassisted, Nonantalgic.    Assessment:   67 year old female with symptomatic onychomycosis, hyperkeratotic lesions     Plan:  -Treatment options discussed including all alternatives, risks, and complications -Etiology of symptoms were discussed -Nails  debrided x10 without any complications or bleeding -Hyperkeratotic lesion sharply debrided x2 without any complications or bleeding.  Offloading pads were dispensed. -Discussed importance of daily foot inspection.  Return in about 3 months (around 08/24/2018).  Trula Slade DPM

## 2018-06-19 ENCOUNTER — Other Ambulatory Visit: Payer: Self-pay

## 2018-06-19 ENCOUNTER — Emergency Department (HOSPITAL_COMMUNITY)
Admission: EM | Admit: 2018-06-19 | Discharge: 2018-06-19 | Disposition: A | Discharge status: Home / Self Care | Payer: PPO | Attending: Emergency Medicine | Admitting: Emergency Medicine

## 2018-06-19 DIAGNOSIS — M5441 Lumbago with sciatica, right side: Secondary | ICD-10-CM | POA: Insufficient documentation

## 2018-06-19 DIAGNOSIS — G8929 Other chronic pain: Secondary | ICD-10-CM

## 2018-06-19 DIAGNOSIS — Z7982 Long term (current) use of aspirin: Secondary | ICD-10-CM | POA: Diagnosis not present

## 2018-06-19 DIAGNOSIS — Z79899 Other long term (current) drug therapy: Secondary | ICD-10-CM | POA: Diagnosis not present

## 2018-06-19 DIAGNOSIS — E119 Type 2 diabetes mellitus without complications: Secondary | ICD-10-CM | POA: Insufficient documentation

## 2018-06-19 DIAGNOSIS — Z87891 Personal history of nicotine dependence: Secondary | ICD-10-CM | POA: Insufficient documentation

## 2018-06-19 DIAGNOSIS — Z7984 Long term (current) use of oral hypoglycemic drugs: Secondary | ICD-10-CM | POA: Insufficient documentation

## 2018-06-19 DIAGNOSIS — M549 Dorsalgia, unspecified: Secondary | ICD-10-CM | POA: Diagnosis present

## 2018-06-19 DIAGNOSIS — I10 Essential (primary) hypertension: Secondary | ICD-10-CM | POA: Diagnosis not present

## 2018-06-19 LAB — COMPREHENSIVE METABOLIC PANEL
ALBUMIN: 3.7 g/dL (ref 3.5–5.0)
ALT: 24 U/L (ref 0–44)
AST: 21 U/L (ref 15–41)
Alkaline Phosphatase: 90 U/L (ref 38–126)
Anion gap: 13 (ref 5–15)
BUN: 10 mg/dL (ref 8–23)
CO2: 24 mmol/L (ref 22–32)
Calcium: 9.5 mg/dL (ref 8.9–10.3)
Chloride: 101 mmol/L (ref 98–111)
Creatinine, Ser: 0.85 mg/dL (ref 0.44–1.00)
GFR calc Af Amer: >60 mL/min (ref >60)
GFR calc non Af Amer: >60 mL/min (ref >60)
GLUCOSE: 194 mg/dL — AB (ref 70–99)
Potassium: 3.4 mmol/L — ABNORMAL LOW (ref 3.5–5.1)
Sodium: 138 mmol/L (ref 135–145)
Total Bilirubin: 1.2 mg/dL (ref 0.3–1.2)
Total Protein: 6.6 g/dL (ref 6.5–8.1)

## 2018-06-19 LAB — URINALYSIS, ROUTINE W REFLEX MICROSCOPIC
BILIRUBIN URINE: NEGATIVE
Glucose, UA: NEGATIVE mg/dL
Hgb urine dipstick: NEGATIVE
KETONES UR: NEGATIVE mg/dL
Nitrite: NEGATIVE
Protein, ur: NEGATIVE mg/dL
SPECIFIC GRAVITY, URINE: 1.01 (ref 1.005–1.030)
pH: 8 (ref 5.0–8.0)

## 2018-06-19 LAB — CBC WITH DIFFERENTIAL/PLATELET
Abs Immature Granulocytes: 0.02 10*3/uL (ref 0.00–0.07)
Basophils Absolute: 0 10*3/uL (ref 0.0–0.1)
Basophils Relative: 1 %
EOS ABS: 0.2 10*3/uL (ref 0.0–0.5)
Eosinophils Relative: 3 %
HCT: 35.1 % — ABNORMAL LOW (ref 36.0–46.0)
Hemoglobin: 11.6 g/dL — ABNORMAL LOW (ref 12.0–15.0)
Immature Granulocytes: 0 %
LYMPHS ABS: 3 10*3/uL (ref 0.7–4.0)
LYMPHS PCT: 42 %
MCH: 29.3 pg (ref 26.0–34.0)
MCHC: 33 g/dL (ref 30.0–36.0)
MCV: 88.6 fL (ref 80.0–100.0)
Monocytes Absolute: 0.5 10*3/uL (ref 0.1–1.0)
Monocytes Relative: 7 %
Neutro Abs: 3.5 10*3/uL (ref 1.7–7.7)
Neutrophils Relative %: 47 %
Platelets: 198 10*3/uL (ref 150–400)
RBC: 3.96 MIL/uL (ref 3.87–5.11)
RDW: 13 % (ref 11.5–15.5)
WBC: 7.3 10*3/uL (ref 4.0–10.5)
nRBC: 0 % (ref 0.0–0.2)

## 2018-06-19 MED ORDER — CYCLOBENZAPRINE HCL 10 MG PO TABS: 10.0000 mg | ORAL_TABLET | Freq: Two times a day (BID) | ORAL | 0 refills | Status: DC | PRN

## 2018-06-19 MED ORDER — KETOROLAC TROMETHAMINE 60 MG/2ML IM SOLN
60.0000 mg | Freq: Once | INTRAMUSCULAR | Status: AC
  Administered 2018-06-19: 30 mg via INTRAMUSCULAR
  Filled 2018-06-19: qty 2

## 2018-06-19 MED ORDER — NAPROXEN 375 MG PO TABS: 375.0000 mg | ORAL_TABLET | Freq: Two times a day (BID) | ORAL | 0 refills | Status: DC

## 2018-06-19 NOTE — ED Notes (Signed)
Pt verbalized discharge paperwork, prescriptions and follow-up care.

## 2018-06-19 NOTE — ED Provider Notes (Signed)
[facility] EMERGENCY DEPARTMENT Provider Note   CSN: [id number] Arrival date & time: 06/19/18  7673     History   Chief Complaint Chief Complaint  Patient presents with  . Back Pain    HPI Ashley Sloan is a 67 y.o. female with a pmhx of breast Ca s/p chemo/rads 1991, DM, HLD, who presented to the Ed today complaining of right sided back pain. 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 She is able to walk without difficulty. No significant aggravating or alleviating factors. Denies bowel or bladder incontinence, lower extremity weakness/numbness, dysuria, hx of kidney stones. She tried taking tylenol at home without relief. She reports history of similar symptoms and has bene previously diagnosed with sciatica. In the past she has taken flexeril which seemed to help.  HPI  Past Medical History:  Diagnosis Date  . 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 Diabetes mellitus   . Hyperlipidemia   . Hypertension   . Left breast mass     Patient Active Problem List   Diagnosis Date Noted  . Abdominal pain 03/08/2018  . Breast lesion 10/05/2017  . Healthcare maintenance 03/17/2016  . Gout 04/16/2014  . Foot deformity 03/16/2014  . HLD (hyperlipidemia) 03/03/2014  . HTN (hypertension), benign 12/19/2011  . Diabetes mellitus, type 2 ([place]) 12/19/2011    Past Surgical History:  Procedure Laterality Date  . BREAST EXCISIONAL BIOPSY Left    benign  . BREAST LUMPECTOMY Left 1991  . BREAST SURGERY  1991   Lumpectomy  . [place]  . MASS EXCISION Left 11/19/2017   Procedure: EXCISION LEFT BREAST  MASS ERAS PATHWAY;  Surgeon: Excell Seltzer, MD;  Location: [facility];  Service: General;  Laterality: Left;     OB History   No obstetric history on file.      Home Medications    Prior to Admission medications   Medication Sig  Start Date End Date Taking? Authorizing Provider  acetaminophen (TYLENOL) 500 MG tablet Take 1,000 mg by mouth every 6 (six) hours as needed for mild pain. Reported on 08/19/2015    [provider]  amLODipine (NORVASC) 5 MG tablet Take 1 tablet (5 mg total) by mouth daily. 03/08/18   Rory Percy, DO  aspirin 81 MG tablet Take 81 mg by mouth daily.    [provider]  atorvastatin (LIPITOR) 40 MG tablet Take 1 tablet (40 mg total) by mouth daily. 01/23/18   Rory Percy, DO  Calcium-Magnesium-Vitamin D (CALCIUM 500 PO) Take by mouth.    [provider]  carvedilol (COREG) 25 MG tablet Take 1 tablet (25 mg total) by mouth 2 (two) times daily with a meal. 12/14/17   Hensel, Jamal Collin, MD  diclofenac sodium (VOLTAREN) 1 % GEL Apply 2 g topically 4 (four) times daily. 12/01/16   Vivi Barrack, MD  lisinopril-hydrochlorothiazide (PRINZIDE,ZESTORETIC) 20-12.5 MG tablet TAKE 2 TABLETS BY MOUTH ONCE DAILY 05/17/18   Rory Percy, DO  metFORMIN (GLUCOPHAGE) 1000 MG tablet Take 1 tablet (1,000 mg total) by mouth 2 (two) times daily with a meal. 01/23/18   Rory Percy, DO    Family History Family History  Problem Relation Age of Onset  . Diabetes Mother   . Hypertension Mother   . COPD Mother   . Hypertension Father   . Heart disease Sister   .  Hypertension Brother   . Diabetes Daughter     Social History Social History   Tobacco Use  . Smoking status: Former Smoker    Packs/day: 0.50    Years: 25.00    Pack years: 12.50    Types: Cigarettes    Start date: 07/03/1964    Last attempt to quit: 12/18/1982    Years since quitting: 35.5  . Smokeless tobacco: Never Used  Substance Use Topics  . Alcohol use: No    Alcohol/week: 0.0 standard drinks  . Drug use: No     Allergies   Patient has no known allergies.   Review of Systems Review of Systems  All other systems reviewed and are negative.    Physical Exam Updated Vital Signs BP (!) 151/74    Pulse 66   Temp 97.7 F (36.5 C) (Oral)   Resp 18   Ht 5' (1.524 m)   Wt 81.6 kg   SpO2 100%   BMI 35.15 kg/m   Physical Exam Vitals signs and nursing note reviewed.  Constitutional:      General: She is not in acute distress.    Appearance: She is well-developed. She is not diaphoretic.  HENT:     Head: Normocephalic and atraumatic.     Mouth/Throat:     Pharynx: No oropharyngeal exudate.  Eyes:     General: No scleral icterus.       Right eye: No discharge.        Left eye: No discharge.     Conjunctiva/sclera: Conjunctivae normal.     Pupils: Pupils are equal, round, and reactive to light.  Cardiovascular:     Rate and Rhythm: Normal rate and regular rhythm.     Heart sounds: Normal heart sounds. No murmur. No friction rub. No gallop.   Pulmonary:     Effort: Pulmonary effort is normal. No respiratory distress.     Breath sounds: Normal breath sounds. No wheezing or rales.  Chest:     Chest wall: No tenderness.  Abdominal:     General: There is no distension.     Palpations: Abdomen is soft.     Tenderness: There is no abdominal tenderness. There is no guarding.  Musculoskeletal: Normal range of motion.     Comments: No midline spinal TTP. FROM of C, T, L spine. No step offs or obvious bony deformities. TTP R lumbar and thoracic paraspinal muscles. Positive SLR on R side   Skin:    General: Skin is warm and dry.     Coloration: Skin is not pale.     Findings: No erythema or rash.  Neurological:     Mental Status: She is alert and oriented to person, place, and time.     Sensory: No sensory deficit.     Motor: No weakness.     Gait: Gait normal.  Psychiatric:        Behavior: Behavior normal.      ED Treatments / Results  Labs (all labs ordered are listed, but only abnormal results are displayed) Labs Reviewed  URINALYSIS, ROUTINE W REFLEX MICROSCOPIC  CBC WITH DIFFERENTIAL/PLATELET  COMPREHENSIVE METABOLIC PANEL    EKG None  Radiology No results  found.  Procedures Procedures (including critical care time)  Medications Ordered in ED Medications - No data to display   Initial Impression / Assessment and Plan / ED Course  I have reviewed the triage vital signs and the nursing notes.  Pertinent labs & imaging results that were  available during my care of the patient were reviewed by me and considered in my medical decision making (see chart for details).     67 y.o F presents to the ED with acute on chronic R sided back pain that radiates down R leg. On exam she had R lumabr and thoracic paraspinal tenderness but also some pain in her flank. Positive SLR. No dysuria. UA and CMP unremarkable.Pt has hx of sciatica and feels similar to previous pain. No red flags,bowel or bladder incontinence, fever, parasthesias. Pt able to ambulate without difficulty. Will discharge home with muscle relaxers and anti-inflammatories. Follow up with PCP. Return precautions outlined in patient discharge instructions.    Final Clinical Impressions(s) / ED Diagnoses   Final diagnoses:  None    ED Discharge Orders    None       Carlos Levering, PA-C 06/19/18 1411    Jola Schmidt, MD 06/19/18 1557

## 2018-06-19 NOTE — Discharge Instructions (Signed)
Take flexeril as prescribed to help with muscle relaxation. This may cause grogginess, would not operate a vehicle while on this medication. Take maproxen as needed for pain/inflammation. Can use heating pads/ice as well to affected area. Follow up with PCP in 1 week if symptoms do not improve. Return to the ED if you experience severe worsening of your symptoms, loss of control of bowel or bladder, numbness or tingling in an extremity.

## 2018-06-19 NOTE — ED Triage Notes (Signed)
Patient c/o right sided back pain that goes down into her right buttock.

## 2018-06-27 ENCOUNTER — Emergency Department (HOSPITAL_COMMUNITY): Payer: PPO

## 2018-06-27 ENCOUNTER — Other Ambulatory Visit: Payer: Self-pay

## 2018-06-27 ENCOUNTER — Emergency Department (HOSPITAL_COMMUNITY)
Admission: EM | Admit: 2018-06-27 | Discharge: 2018-06-27 | Disposition: A | Discharge status: Home / Self Care | Payer: PPO | Attending: Emergency Medicine | Admitting: Emergency Medicine

## 2018-06-27 DIAGNOSIS — R319 Hematuria, unspecified: Secondary | ICD-10-CM | POA: Insufficient documentation

## 2018-06-27 DIAGNOSIS — K5790 Diverticulosis of intestine, part unspecified, without perforation or abscess without bleeding: Secondary | ICD-10-CM | POA: Diagnosis not present

## 2018-06-27 DIAGNOSIS — Z87891 Personal history of nicotine dependence: Secondary | ICD-10-CM | POA: Diagnosis not present

## 2018-06-27 DIAGNOSIS — M5441 Lumbago with sciatica, right side: Secondary | ICD-10-CM | POA: Diagnosis not present

## 2018-06-27 DIAGNOSIS — E119 Type 2 diabetes mellitus without complications: Secondary | ICD-10-CM | POA: Insufficient documentation

## 2018-06-27 DIAGNOSIS — Z853 Personal history of malignant neoplasm of breast: Secondary | ICD-10-CM | POA: Diagnosis not present

## 2018-06-27 DIAGNOSIS — I10 Essential (primary) hypertension: Secondary | ICD-10-CM | POA: Diagnosis not present

## 2018-06-27 DIAGNOSIS — R1031 Right lower quadrant pain: Secondary | ICD-10-CM | POA: Insufficient documentation

## 2018-06-27 DIAGNOSIS — R109 Unspecified abdominal pain: Secondary | ICD-10-CM

## 2018-06-27 LAB — URINALYSIS, ROUTINE W REFLEX MICROSCOPIC
Bilirubin Urine: NEGATIVE
Glucose, UA: NEGATIVE mg/dL
Ketones, ur: NEGATIVE mg/dL
NITRITE: NEGATIVE
Protein, ur: NEGATIVE mg/dL
RBC / HPF: >50 RBC/hpf — ABNORMAL HIGH (ref 0–5)
Specific Gravity, Urine: 1.015 (ref 1.005–1.030)
pH: 5 (ref 5.0–8.0)

## 2018-06-27 MED ORDER — METHOCARBAMOL 500 MG PO TABS: 500.0000 mg | ORAL_TABLET | Freq: Three times a day (TID) | ORAL | 0 refills | Status: DC | PRN

## 2018-06-27 MED ORDER — LIDOCAINE HCL 4 % EX SOLN: Freq: Three times a day (TID) | CUTANEOUS | 0 refills | Status: DC | PRN

## 2018-06-27 MED ORDER — HYDROCODONE-ACETAMINOPHEN 5-325 MG PO TABS
1.0000 | ORAL_TABLET | Freq: Once | ORAL | Status: AC
  Administered 2018-06-27: 1 via ORAL
  Filled 2018-06-27: qty 1

## 2018-06-27 MED ORDER — DEXAMETHASONE 10 MG/ML FOR PEDIATRIC ORAL USE
10.0000 mg | Freq: Once | INTRAMUSCULAR | Status: AC
  Administered 2018-06-27: 10 mg via ORAL
  Filled 2018-06-27 (×2): qty 1

## 2018-06-27 NOTE — ED Notes (Signed)
Please note: Urine culture collected and sent down to Main Lab in addition to UA.

## 2018-06-27 NOTE — Discharge Instructions (Signed)

## 2018-06-27 NOTE — ED Provider Notes (Signed)
Emergency Department Provider Note   I have reviewed the triage vital signs and the nursing notes.   HISTORY  Chief Complaint Back Pain   HPI Ashley Sloan is a 67 y.o. female with PMH of breast cancer, DM, HLD, and HTN presents to the emergency department with continued lower back and right leg pain.  Patient describes pain in the mid to lower back worse on the right side.  Pain symptoms radiate around to the front but also into the right leg.  No numbness or weakness symptoms.  No fevers or chills.  She is not experiencing any weakness or numbness.  Symptoms are worse with movement.  She denies any dysuria, hesitancy, or urgency.  She was seen in the emergency department earlier this month with similar symptoms and has been taking the medications prescribed with no significant relief. No fever/chills. No IVDA.   Past Medical History:  Diagnosis Date  . Cancer ([place]) 1991   Breast-left  . Diabetes mellitus   . Hyperlipidemia   . Hypertension   . Left breast mass     Patient Active Problem List   Diagnosis Date Noted  . Abdominal pain 03/08/2018  . Breast lesion 10/05/2017  . Healthcare maintenance 03/17/2016  . Gout 04/16/2014  . Foot deformity 03/16/2014  . HLD (hyperlipidemia) 03/03/2014  . HTN (hypertension), benign 12/19/2011  . Diabetes mellitus, type 2 ([place]) 12/19/2011    Past Surgical History:  Procedure Laterality Date  . BREAST EXCISIONAL BIOPSY Left    benign  . BREAST LUMPECTOMY Left 1991  . BREAST SURGERY  1991   Lumpectomy  . [place]  . MASS EXCISION Left 11/19/2017   Procedure: EXCISION LEFT BREAST  MASS ERAS PATHWAY;  Surgeon: Excell Seltzer, MD;  Location: [facility];  Service: General;  Laterality: Left;    Allergies Patient has no known allergies.  Family History  Problem Relation Age of Onset  . Diabetes Mother   . Hypertension Mother   . COPD Mother   . Hypertension Father   . Heart disease Sister   .  Hypertension Brother   . Diabetes Daughter     Social History Social History   Tobacco Use  . Smoking status: Former Smoker    Packs/day: 0.50    Years: 25.00    Pack years: 12.50    Types: Cigarettes    Start date: 07/03/1964    Last attempt to quit: 12/18/1982    Years since quitting: 35.5  . Smokeless tobacco: Never Used  Substance Use Topics  . Alcohol use: No    Alcohol/week: 0.0 standard drinks  . Drug use: No    Review of Systems  Constitutional: No fever/chills Eyes: No visual changes. ENT: No sore throat. Cardiovascular: Denies chest pain. Respiratory: Denies shortness of breath. Gastrointestinal: No abdominal pain.  No nausea, no vomiting.  No diarrhea.  No constipation. Genitourinary: Negative for dysuria. Musculoskeletal: Positive back and right leg pain.  Skin: Negative for rash. Neurological: Negative for headaches, focal weakness or numbness.  10-point ROS otherwise negative.  ____________________________________________   PHYSICAL EXAM:  VITAL SIGNS: ED Triage Vitals  Enc Vitals Group     BP 06/27/18 0905 (!) 159/79     Pulse Rate 06/27/18 0906 86     Resp 06/27/18 0910 16     Temp 06/27/18 0910 (!) 97.5 F (36.4 C)     Temp Source 06/27/18 0910 Oral     SpO2 06/27/18 0906 100 %  Weight 06/27/18 0908 180 lb (81.6 kg)     Height 06/27/18 0908 5' (1.524 m)     Pain Score 06/27/18 0908 10   Constitutional: Alert and oriented. Well appearing and in no acute distress. Eyes: Conjunctivae are normal.  Head: Atraumatic. Nose: No congestion/rhinnorhea. Mouth/Throat: Mucous membranes are moist. Neck: No stridor.  Cardiovascular: Normal rate, regular rhythm. Good peripheral circulation. Grossly normal heart sounds.   Respiratory: Normal respiratory effort.  No retractions. Lungs CTAB. Gastrointestinal: Soft with mild RLQ discomfort. No distention.  Musculoskeletal: No lower extremity tenderness nor edema. Normal ROM of the right hip and knee.  Midline and paraspinal tenderness over the thoracic and lumbar spine.  Neurologic:  Normal speech and language. No gross focal neurologic deficits are appreciated.  Skin:  Skin is warm, dry and intact. No rash noted.  ____________________________________________   LABS (all labs ordered are listed, but only abnormal results are displayed)  Labs Reviewed  URINALYSIS, ROUTINE W REFLEX MICROSCOPIC - Abnormal; Notable for the following components:      Result Value   Hgb urine dipstick LARGE (*)    Leukocytes, UA TRACE (*)    RBC / HPF >50 (*)    Bacteria, UA RARE (*)    Non Squamous Epithelial 0-5 (*)    All other components within normal limits   ____________________________________________  RADIOLOGY  Ct Renal Stone Study  Result Date: 06/27/2018 CLINICAL DATA:  Right low back pain radiating to the leg for 3 days. No known injury. EXAM: CT ABDOMEN AND PELVIS WITHOUT CONTRAST TECHNIQUE: Multidetector CT imaging of the abdomen and pelvis was performed following the standard protocol without IV contrast. COMPARISON:  None. FINDINGS: Lower chest: Lung bases are clear. No pleural or pericardial effusion. Heart size is normal. Hepatobiliary: No focal liver abnormality is seen. No gallstones, gallbladder wall thickening, or biliary dilatation. Pancreas: Unremarkable. No pancreatic ductal dilatation or surrounding inflammatory changes. Spleen: Normal in size without focal abnormality. Adrenals/Urinary Tract: Adrenal glands are unremarkable. Kidneys are normal, without renal calculi, focal lesion, or hydronephrosis. Bladder is unremarkable. Stomach/Bowel: Extensive diverticulosis without diverticulitis is identified. Small hiatal hernia is seen. Small bowel and appendix appear normal. Vascular/Lymphatic: Aortic atherosclerosis. No enlarged abdominal or pelvic lymph nodes. Reproductive: Uterus and bilateral adnexa are unremarkable. Other: No lymphadenopathy or fluid. Musculoskeletal: No acute or focal  abnormality. Lumbar spondylosis appears most notable at L4-5 where there is disc bulging and moderate to moderately severe facet degenerative disease. IMPRESSION: Negative for urinary tract stone. No acute abnormality abdomen or pelvis. Atherosclerosis. Diverticulosis without diverticulitis. Lower lumbar degenerative disease. Electronically Signed   By: Inge Rise M.D.   On: 06/27/2018 10:31    ____________________________________________   PROCEDURES  Procedure(s) performed:   Procedures  None ____________________________________________   INITIAL IMPRESSION / ASSESSMENT AND PLAN / ED COURSE  Pertinent labs & imaging results that were available during my care of the patient were reviewed by me and considered in my medical decision making (see chart for details).  Patient presents to the emergency department for evaluation of back and right flank pain. Pain may be due to sciatica but pain begins higher up than I would expect. Some pain radiating down to the right abdomen and flank.  Plan for CT renal.  Labs and urinalysis reviewed from her recent ED visit.  No indication at this is from a vascular etiology.  Patient is at somewhat higher risk given her history of breast cancer and age. No signs on exam to suspect cauda equina or  spinal cord emergency.   CT imaging and UA reviewed. Patient with hematuria without infection. CT without renal stone. No acute bony abnormality. Plan to change from Flexeril to Robaxin and prescribed topical lidocaine. Patient given steroid here (Decadron) and provided outpatient f/u information for spine clinic and Urology. Discussed results with patient including plan at discharge.  ____________________________________________  FINAL CLINICAL IMPRESSION(S) / ED DIAGNOSES  Final diagnoses:  Flank pain  Hematuria, unspecified type  Acute right-sided low back pain with right-sided sciatica     MEDICATIONS GIVEN DURING THIS VISIT:  Medications    dexamethasone (DECADRON) 10 MG/ML injection for Pediatric ORAL use 10 mg (has no administration in time range)  HYDROcodone-acetaminophen (NORCO/VICODIN) 5-325 MG per tablet 1 tablet (1 tablet Oral Given 06/27/18 0924)     NEW OUTPATIENT MEDICATIONS STARTED DURING THIS VISIT:  New Prescriptions   LIDOCAINE (XYLOCAINE) 4 % EXTERNAL SOLUTION    Apply topically 3 (three) times daily as needed.   METHOCARBAMOL (ROBAXIN) 500 MG TABLET    Take 1 tablet (500 mg total) by mouth every 8 (eight) hours as needed for muscle spasms.    Note:  This document was prepared using Dragon voice recognition software and may include unintentional dictation errors.  Nanda Quinton, MD Emergency Medicine    Long, [facility], MD 06/27/18 1255

## 2018-06-27 NOTE — ED Triage Notes (Signed)
Pt reports that she has what she thinks is "sciatica pain" on her right side. Pt states that the pain started three days ago and starts in her right lower back and radiates down her leg. Pt reports that she does have flexeril at home that she took last night for the pain.

## 2018-06-27 NOTE — ED Notes (Signed)
Discharge instructions given to patient. Follow up and medications discussed with patient. Pt verbalized understanding.

## 2018-07-12 ENCOUNTER — Encounter: Payer: Self-pay | Admitting: Family Medicine

## 2018-07-17 ENCOUNTER — Encounter (HOSPITAL_COMMUNITY): Payer: Self-pay | Admitting: Emergency Medicine

## 2018-07-17 ENCOUNTER — Emergency Department (HOSPITAL_COMMUNITY)
Admission: EM | Admit: 2018-07-17 | Discharge: 2018-07-17 | Disposition: A | Discharge status: Home / Self Care | Payer: PPO | Attending: Emergency Medicine | Admitting: Emergency Medicine

## 2018-07-17 ENCOUNTER — Emergency Department (HOSPITAL_COMMUNITY): Payer: PPO

## 2018-07-17 DIAGNOSIS — J40 Bronchitis, not specified as acute or chronic: Secondary | ICD-10-CM

## 2018-07-17 DIAGNOSIS — I10 Essential (primary) hypertension: Secondary | ICD-10-CM | POA: Insufficient documentation

## 2018-07-17 DIAGNOSIS — Z7982 Long term (current) use of aspirin: Secondary | ICD-10-CM | POA: Diagnosis not present

## 2018-07-17 DIAGNOSIS — Z7984 Long term (current) use of oral hypoglycemic drugs: Secondary | ICD-10-CM | POA: Insufficient documentation

## 2018-07-17 DIAGNOSIS — Z853 Personal history of malignant neoplasm of breast: Secondary | ICD-10-CM | POA: Diagnosis not present

## 2018-07-17 DIAGNOSIS — E119 Type 2 diabetes mellitus without complications: Secondary | ICD-10-CM | POA: Diagnosis not present

## 2018-07-17 DIAGNOSIS — R05 Cough: Secondary | ICD-10-CM | POA: Diagnosis not present

## 2018-07-17 DIAGNOSIS — E785 Hyperlipidemia, unspecified: Secondary | ICD-10-CM | POA: Diagnosis not present

## 2018-07-17 DIAGNOSIS — Z87891 Personal history of nicotine dependence: Secondary | ICD-10-CM | POA: Insufficient documentation

## 2018-07-17 DIAGNOSIS — Z79899 Other long term (current) drug therapy: Secondary | ICD-10-CM | POA: Insufficient documentation

## 2018-07-17 NOTE — ED Provider Notes (Signed)
[facility] EMERGENCY DEPARTMENT Provider Note   CSN: [id number] Arrival date & time: 07/17/18  9211     History   Chief Complaint Chief Complaint  Patient presents with  . Cough    HPI Ashley Sloan is a 68 y.o. female.  HPI   68 year old female presents today with complaints of respiratory infection.  Patient has symptoms started approximately 4 days ago with cough, fatigue, body aches and rhinorrhea.  She notes that she has continued to feel fatigued, she denies any fever at home.  She has taken Tylenol Cold and flu without significant improvement in her symptoms.  Patient notes she has a history of diabetes currently taking metformin.  Past Medical History:  Diagnosis Date  . Cancer ([place]) 1991   Breast-left  . Diabetes mellitus   . Hyperlipidemia   . Hypertension   . Left breast mass     Patient Active Problem List   Diagnosis Date Noted  . Abdominal pain 03/08/2018  . Breast lesion 10/05/2017  . Healthcare maintenance 03/17/2016  . Gout 04/16/2014  . Foot deformity 03/16/2014  . HLD (hyperlipidemia) 03/03/2014  . HTN (hypertension), benign 12/19/2011  . Diabetes mellitus, type 2 ([place]) 12/19/2011    Past Surgical History:  Procedure Laterality Date  . BREAST EXCISIONAL BIOPSY Left    benign  . BREAST LUMPECTOMY Left 1991  . BREAST SURGERY  1991   Lumpectomy  . [place]  . MASS EXCISION Left 11/19/2017   Procedure: EXCISION LEFT BREAST  MASS ERAS PATHWAY;  Surgeon: Excell Seltzer, MD;  Location: [facility];  Service: General;  Laterality: Left;     OB History   No obstetric history on file.      Home Medications    Prior to Admission medications   Medication Sig Start Date End Date Taking? Authorizing Provider  acetaminophen (TYLENOL) 500 MG tablet Take 1,000 mg by mouth every 6 (six) hours as needed for mild pain. Reported on 08/19/2015    [provider]  amLODipine (NORVASC) 5 MG  tablet Take 1 tablet (5 mg total) by mouth daily. 03/08/18   Rory Percy, DO  aspirin 81 MG tablet Take 81 mg by mouth daily.    [provider]  Aspirin-Caffeine (BC FAST PAIN RELIEF ARTHRITIS PO) Take 1 Package by mouth as needed (pain).    [provider]  atorvastatin (LIPITOR) 40 MG tablet Take 1 tablet (40 mg total) by mouth daily. 01/23/18   Rory Percy, DO  Calcium-Magnesium-Vitamin D (CALCIUM 500 PO) Take by mouth.    [provider]  carvedilol (COREG) 25 MG tablet Take 1 tablet (25 mg total) by mouth 2 (two) times daily with a meal. 12/14/17   Hensel, Jamal Collin, MD  diclofenac sodium (VOLTAREN) 1 % GEL Apply 2 g topically 4 (four) times daily. Patient taking differently: Apply 2 g topically 4 (four) times daily as needed (joint pain).  12/01/16   Vivi Barrack, MD  lidocaine (XYLOCAINE) 4 % external solution Apply topically 3 (three) times daily as needed. 06/27/18   Long, Wonda Olds, MD  lisinopril-hydrochlorothiazide (PRINZIDE,ZESTORETIC) 20-12.5 MG tablet TAKE 2 TABLETS BY MOUTH ONCE DAILY 05/17/18   Rory Percy, DO  metFORMIN (GLUCOPHAGE) 1000 MG tablet Take 1 tablet (1,000 mg total) by mouth 2 (two) times daily with a meal. 01/23/18   Rumball, Bryson Ha, DO  methocarbamol (ROBAXIN) 500 MG tablet Take 1 tablet (500 mg total) by mouth every 8 (eight) hours as  needed for muscle spasms. 06/27/18   Long, Wonda Olds, MD  naproxen (NAPROSYN) 375 MG tablet Take 1 tablet (375 mg total) by mouth 2 (two) times daily. 06/19/18   Dowless, Dondra Spry, PA-C    Family History Family History  Problem Relation Age of Onset  . Diabetes Mother   . Hypertension Mother   . COPD Mother   . Hypertension Father   . Heart disease Sister   . Hypertension Brother   . Diabetes Daughter     Social History Social History   Tobacco Use  . Smoking status: Former Smoker    Packs/day: 0.50    Years: 25.00    Pack years: 12.50    Types: Cigarettes    Start date:  07/03/1964    Last attempt to quit: 12/18/1982    Years since quitting: 35.6  . Smokeless tobacco: Never Used  Substance Use Topics  . Alcohol use: No    Alcohol/week: 0.0 standard drinks  . Drug use: No     Allergies   Patient has no known allergies.   Review of Systems Review of Systems  All other systems reviewed and are negative.   Physical Exam Updated Vital Signs BP (!) 147/80   Pulse 77   Temp 98.6 F (37 C) (Oral)   Resp 18   SpO2 97%   Physical Exam Vitals signs and nursing note reviewed.  Constitutional:      Appearance: She is well-developed.  HENT:     Head: Normocephalic and atraumatic.     Mouth/Throat:     Comments: Oropharynx clear no erythema exudate or edema Eyes:     General: No scleral icterus.       Right eye: No discharge.        Left eye: No discharge.     Conjunctiva/sclera: Conjunctivae normal.     Pupils: Pupils are equal, round, and reactive to light.  Neck:     Musculoskeletal: Normal range of motion.     Vascular: No JVD.     Trachea: No tracheal deviation.  Pulmonary:     Effort: Pulmonary effort is normal. No respiratory distress.     Breath sounds: Normal breath sounds. No stridor. No wheezing, rhonchi or rales.  Neurological:     Mental Status: She is alert and oriented to person, place, and time.     Coordination: Coordination normal.  Psychiatric:        Behavior: Behavior normal.        Thought Content: Thought content normal.        Judgment: Judgment normal.      ED Treatments / Results  Labs (all labs ordered are listed, but only abnormal results are displayed) Labs Reviewed - No data to display  EKG None  Radiology Dg Chest 2 View  Result Date: 07/17/2018 CLINICAL DATA:  Cough. EXAM: CHEST - 2 VIEW COMPARISON:  None. FINDINGS: The heart size and vascularity are normal. No infiltrates or effusions. Slight peribronchial thickening seen on the lateral view. Aortic atherosclerosis. Previous left breast surgery  with surgical clips in the left axilla. No acute bone abnormality. IMPRESSION: Slight bronchitic changes. Aortic Atherosclerosis (ICD10-I70.0). Electronically Signed   By: Lorriane Shire M.D.   On: 07/17/2018 09:14    Procedures Procedures (including critical care time)  Medications Ordered in ED Medications - No data to display   Initial Impression / Assessment and Plan / ED Course  I have reviewed the triage vital signs and the nursing notes.  Pertinent labs & imaging results that were available during my care of the patient were reviewed by me and considered in my medical decision making (see chart for details).     68 year old female presents today with bronchitis.  No signs of pneumonia influenza or any other life-threatening etiology at this time.  Patient given symptomatic care instructions both her and her daughter verbalized understanding and agreement to today's plan had no further questions or concerns.  Final Clinical Impressions(s) / ED Diagnoses   Final diagnoses:  Bronchitis    ED Discharge Orders    None       Francee Gentile 07/17/18 0920    Isla Pence, MD 07/17/18 1236

## 2018-07-17 NOTE — Discharge Instructions (Addendum)
Please read attached information. If you experience any new or worsening signs or symptoms please return to the emergency room for evaluation. Please follow-up with your primary care provider or specialist as discussed.  °

## 2018-08-23 ENCOUNTER — Ambulatory Visit: Payer: PPO | Admitting: Podiatry

## 2018-10-04 ENCOUNTER — Ambulatory Visit: Payer: PPO | Admitting: Podiatry

## 2018-10-09 ENCOUNTER — Other Ambulatory Visit: Payer: Self-pay | Admitting: Family Medicine

## 2018-10-09 DIAGNOSIS — I10 Essential (primary) hypertension: Secondary | ICD-10-CM

## 2018-11-13 ENCOUNTER — Other Ambulatory Visit: Payer: Self-pay | Admitting: Family Medicine

## 2018-11-13 DIAGNOSIS — I10 Essential (primary) hypertension: Secondary | ICD-10-CM

## 2018-12-20 ENCOUNTER — Other Ambulatory Visit: Payer: Self-pay

## 2018-12-20 DIAGNOSIS — I10 Essential (primary) hypertension: Secondary | ICD-10-CM

## 2018-12-23 MED ORDER — CARVEDILOL 25 MG PO TABS: 25.0000 mg | ORAL_TABLET | Freq: Two times a day (BID) | ORAL | 3 refills | Status: DC

## 2019-03-04 ENCOUNTER — Other Ambulatory Visit: Payer: Self-pay

## 2019-03-04 ENCOUNTER — Encounter: Payer: Self-pay | Admitting: Family Medicine

## 2019-03-04 ENCOUNTER — Ambulatory Visit: Payer: PPO | Admitting: Family Medicine

## 2019-03-04 ENCOUNTER — Ambulatory Visit (INDEPENDENT_AMBULATORY_CARE_PROVIDER_SITE_OTHER): Payer: PPO | Admitting: Family Medicine

## 2019-03-04 VITALS — BP 126/60 | HR 72 | Wt 159.0 lb

## 2019-03-04 DIAGNOSIS — Z23 Encounter for immunization: Secondary | ICD-10-CM | POA: Diagnosis not present

## 2019-03-04 DIAGNOSIS — H6120 Impacted cerumen, unspecified ear: Secondary | ICD-10-CM | POA: Insufficient documentation

## 2019-03-04 DIAGNOSIS — R634 Abnormal weight loss: Secondary | ICD-10-CM | POA: Diagnosis not present

## 2019-03-04 DIAGNOSIS — E11618 Type 2 diabetes mellitus with other diabetic arthropathy: Secondary | ICD-10-CM | POA: Diagnosis not present

## 2019-03-04 DIAGNOSIS — H6123 Impacted cerumen, bilateral: Secondary | ICD-10-CM | POA: Diagnosis not present

## 2019-03-04 DIAGNOSIS — I10 Essential (primary) hypertension: Secondary | ICD-10-CM

## 2019-03-04 LAB — POCT GLYCOSYLATED HEMOGLOBIN (HGB A1C): HbA1c, POC (controlled diabetic range): 7.1 % — AB (ref 0.0–7.0)

## 2019-03-04 NOTE — Assessment & Plan Note (Signed)
Unclear etiology.  Is up-to-date on age-appropriate cancer screenings.  Currently asymptomatic.  Will obtain CBC, CMP, TSH, ESR, LDH to evaluate further.  Follow-up in 1 month for repeat weight and symptom check.

## 2019-03-04 NOTE — Assessment & Plan Note (Signed)
At goal without symptoms of hypotension, no changes made today. 

## 2019-03-04 NOTE — Assessment & Plan Note (Signed)
Noted when she went to get hearing aids.  Visualized on exam.  Ear irrigation provided in clinic with symptomatic relief.

## 2019-03-04 NOTE — Patient Instructions (Signed)
It was great to see you!  Our plans for today:  - No medication changes made today. - If you feel dizzy, take your blood pressure. - We referred you to the eye doctor to update your eye exam. - We irrigated your ears today to remove some of the wax. - We gave you the pneumonia and flu shots today.  We are checking some labs today, we will call you or send you a letter if they are abnormal.   Take care and seek immediate care sooner if you develop any concerns.   Dr. [facility]

## 2019-03-04 NOTE — Assessment & Plan Note (Addendum)
A1c 7.1 today.  Tolerating metformin well, no changes made today.  Foot exam completed today, within normal limits.  Received pneumococcal vaccination today.  Referral placed for eye exam.  Follow-up in 6 months for next A1c.

## 2019-03-04 NOTE — Progress Notes (Signed)
Subjective:   Patient ID: Ashley Sloan    DOB: 02-12-1951, 68 y.o. female   MRN: [id number]  KHYLIE LARMORE is a 68 y.o. female with a history of HTN, T2DM, gout, HLD here for   Diabetes, Type 2 - Last A1c 7.4 03/2018 - Medications: Metformin 1073m twice daily - Compliance: good - Checking BG at home: no - Diet: eats 2-3 meals per day. Breakfast (Pecan twirl, coffee, mandarin or bowl of cereal or egg), lunch ham sandwich and cheetos, dinner 2 piece chicken white meat, green beans, rice, lemonade. - Exercise: none. Has bought exercise bike to "get toned." - Eye exam: ordered - Foot exam: due - Microalbumin: n/a - Statin: yes - Denies symptoms of hypoglycemia, polyuria, polydipsia, numbness extremities, foot ulcers/trauma  Hypertension: - Medications: Amlodipine 5 mg, carvedilol 25 mg, lisinopril-HCTZ 20-12.5 mg - Compliance: good - Checking BP at home: not lately - Denies any SOB, CP, vision changes, LE edema, medication SEs, or symptoms of hypotension - Diet: see above - Exercise: see above  Cerumen impaction Wants to get her ears flushed. Went to get hearing aids and was told she had too much wax. Some hearing deficits. No ear pain. No discharge or bleeding. Has tried OTC ear drops.  Unintentional Weight Loss Has lost some weight ~20lbs since 06/2018. Not intentional. H/o breast cancer in L breast, last mammogram 05/2018 normal.  UTD with colonoscopy 12/2017 with tubular adenomas removed, due for repeat 2021. Former smoker, 12.5 pack year history, quit in 1984. Denies nausea, difficulties eating, early satiety, blood in urine or stool, chest discomfort or shortness of breath.  Review of Systems:  Per HPI.  [place] medications and smoking status reviewed.  Objective:   BP 126/60   Pulse 72   Wt 159 lb (72.1 kg)   SpO2 97%   BMI 31.05 kg/m  Vitals and nursing note reviewed.  General: Overweight elderly female, in no acute distress with non-toxic appearance HEENT:  normocephalic, atraumatic, moist mucous membranes CV: regular rate and rhythm without murmurs, rubs, or gallops, no lower extremity edema Lungs: clear to auscultation bilaterally with normal work of breathing Abdomen: soft, non-tender, non-distended, no masses or organomegaly palpable, normoactive bowel sounds Skin: warm, dry, no rashes or lesions Extremities: warm and well perfused, normal tone MSK: ROM grossly intact, strength intact, gait normal Neuro: Alert and oriented, speech normal  Assessment & Plan:   Diabetes mellitus, type 2 ([facility]) A1c 7.1 today.  Tolerating metformin well, no changes made today.  Foot exam completed today, within normal limits.  Received pneumococcal vaccination today.  Referral placed for eye exam.  Follow-up in 6 months for next A1c.  HTN (hypertension), benign At goal without symptoms of hypotension, no changes made today.  Unintentional weight loss Unclear etiology.  Is up-to-date on age-appropriate cancer screenings.  Currently asymptomatic.  Will obtain CBC, CMP, TSH, ESR, LDH to evaluate further.  Follow-up in 1 month for repeat weight and symptom check.   Cerumen impaction Noted when she went to get hearing aids.  Visualized on exam.  Ear irrigation provided in clinic with symptomatic relief.  Orders Placed This Encounter  Procedures  . Flu Vaccine QUAD 36+ mos IM  . Pneumococcal conjugate vaccine 13-valent IM  . CBC  . Comprehensive metabolic panel    Order Specific Question:   Has the patient fasted?    Answer:   No  . Lactate Dehydrogenase  . Sedimentation Rate  . TSH  . Ambulatory referral to Ophthalmology  Referral Priority:   Routine    Referral Type:   Consultation    Referral Reason:   Specialty Services Required    Requested Specialty:   Ophthalmology    Number of Visits Requested:   1  . HgB A1c   No orders of the defined types were placed in this encounter.   Rory Percy, DO PGY-3, [facility] 03/04/2019  5:31 PM

## 2019-03-05 LAB — CBC
Hematocrit: 33 % — ABNORMAL LOW (ref 34.0–46.6)
Hemoglobin: 10.8 g/dL — ABNORMAL LOW (ref 11.1–15.9)
MCH: 28.2 pg (ref 26.6–33.0)
MCHC: 32.7 g/dL (ref 31.5–35.7)
MCV: 86 fL (ref 79–97)
Platelets: 220 10*3/uL (ref 150–450)
RBC: 3.83 x10E6/uL (ref 3.77–5.28)
RDW: 13 % (ref 11.7–15.4)
WBC: 6.4 10*3/uL (ref 3.4–10.8)

## 2019-03-05 LAB — COMPREHENSIVE METABOLIC PANEL
ALT: 27 IU/L (ref 0–32)
AST: 20 IU/L (ref 0–40)
Albumin/Globulin Ratio: 2.2 (ref 1.2–2.2)
Albumin: 4.3 g/dL (ref 3.8–4.8)
Alkaline Phosphatase: 84 IU/L (ref 39–117)
BUN/Creatinine Ratio: 17 (ref 12–28)
BUN: 21 mg/dL (ref 8–27)
Bilirubin Total: 1.2 mg/dL (ref 0.0–1.2)
CO2: 22 mmol/L (ref 20–29)
Calcium: 9.8 mg/dL (ref 8.7–10.3)
Chloride: 100 mmol/L (ref 96–106)
Creatinine, Ser: 1.24 mg/dL — ABNORMAL HIGH (ref 0.57–1.00)
GFR calc Af Amer: 51 mL/min/{1.73_m2} — ABNORMAL LOW (ref >59)
GFR calc non Af Amer: 44 mL/min/{1.73_m2} — ABNORMAL LOW (ref >59)
Globulin, Total: 2 g/dL (ref 1.5–4.5)
Glucose: 106 mg/dL — ABNORMAL HIGH (ref 65–99)
Potassium: 3.6 mmol/L (ref 3.5–5.2)
Sodium: 136 mmol/L (ref 134–144)
Total Protein: 6.3 g/dL (ref 6.0–8.5)

## 2019-03-05 LAB — TSH: TSH: 1.4 u[IU]/mL (ref 0.450–4.500)

## 2019-03-05 LAB — SEDIMENTATION RATE: Sed Rate: 7 mm/hr (ref 0–40)

## 2019-03-05 LAB — LACTATE DEHYDROGENASE: LDH: 210 IU/L (ref 119–226)

## 2019-03-06 ENCOUNTER — Other Ambulatory Visit: Payer: Self-pay | Admitting: Family Medicine

## 2019-03-06 DIAGNOSIS — D649 Anemia, unspecified: Secondary | ICD-10-CM

## 2019-03-20 ENCOUNTER — Other Ambulatory Visit: Payer: Self-pay | Admitting: Family Medicine

## 2019-03-20 DIAGNOSIS — E11618 Type 2 diabetes mellitus with other diabetic arthropathy: Secondary | ICD-10-CM

## 2019-03-28 DIAGNOSIS — H5213 Myopia, bilateral: Secondary | ICD-10-CM | POA: Diagnosis not present

## 2019-03-28 DIAGNOSIS — H52223 Regular astigmatism, bilateral: Secondary | ICD-10-CM | POA: Diagnosis not present

## 2019-03-28 DIAGNOSIS — Z961 Presence of intraocular lens: Secondary | ICD-10-CM | POA: Diagnosis not present

## 2019-03-28 DIAGNOSIS — E119 Type 2 diabetes mellitus without complications: Secondary | ICD-10-CM | POA: Diagnosis not present

## 2019-03-28 DIAGNOSIS — H524 Presbyopia: Secondary | ICD-10-CM | POA: Diagnosis not present

## 2019-03-28 DIAGNOSIS — H26493 Other secondary cataract, bilateral: Secondary | ICD-10-CM | POA: Diagnosis not present

## 2019-03-28 DIAGNOSIS — H35033 Hypertensive retinopathy, bilateral: Secondary | ICD-10-CM | POA: Diagnosis not present

## 2019-03-28 DIAGNOSIS — H04123 Dry eye syndrome of bilateral lacrimal glands: Secondary | ICD-10-CM | POA: Diagnosis not present

## 2019-04-01 ENCOUNTER — Other Ambulatory Visit: Payer: Self-pay | Admitting: Family Medicine

## 2019-04-01 DIAGNOSIS — E785 Hyperlipidemia, unspecified: Secondary | ICD-10-CM

## 2019-04-06 NOTE — Progress Notes (Signed)
  Subjective:   Patient ID: Ashley Sloan    DOB: 12-24-1950, 68 y.o. female   MRN: [id number]  Ashley Sloan is a 68 y.o. female with a history of HTN, T2 DM, gout, HLD here for   Unintentional weight loss - Previously seen 9/1 with unintentional 20 pound weight loss since 06/2018. - H/o breast cancer in L breast, last mammogram 05/2018 normal.  UTD with colonoscopy 12/2017 with tubular adenomas removed, due for repeat 2021. Former smoker, 12.5 pack year history, quit in 1984. -LDH, TSH, ESR within normal limits.  Creatinine slightly elevated at 1.24, baseline around 0.8.  Hemoglobin slightly low at 10.8, baseline around 11. -Per last conversation, referred to GI given anemia and H/o adenomatous polyps.  Was also taking daily NSAIDs.  She has not taken any more BC powder. -Denies blood in stool, dark tarry stool, abd pain, hematuria, N/V, lumps or bumps. No fevers. - has virtual visit with GI tomorrow at 3pm. - is not on iron supplementations - tries to eat fruits and vegetables. Eats lots of kale.  - no trouble eating.   Review of Systems:  Per HPI.  Medications and smoking status reviewed.  Objective:   BP 118/60   Pulse 62   Wt 157 lb (71.2 kg)   SpO2 98%   BMI 30.66 kg/m  Vitals and nursing note reviewed.  General: overweight elderly female, in no acute distress with non-toxic appearance CV: regular rate and rhythm without murmurs, rubs, or gallops, no lower extremity edema Lungs: clear to auscultation bilaterally with normal work of breathing Abdomen: soft, non-tender, non-distended, no masses or organomegaly palpable, normoactive bowel sounds Skin: warm, dry, no rashes or lesions  Assessment & Plan:   Unintentional weight loss 2lb weight loss since last visit last month. Continues to be asymptomatic. UTD on age appropriate cancer screenings. LDH, TSH, ESR obtained at last visit and within normal limits. Has virtual GI visit tomorrow given anemia and h/o polyps, may need  f/u colonoscopy. Will obtain anemia panel, hep c antibody and HIV today.   Orders Placed This Encounter  Procedures  . Anemia panel  . HIV antibody (with reflex)  . Basic Metabolic Panel  . Hepatitis c antibody (reflex)   No orders of the defined types were placed in this encounter.  Rory Percy, DO PGY-3, [facility] 04/07/2019 5:45 PM

## 2019-04-07 ENCOUNTER — Encounter: Payer: Self-pay | Admitting: Family Medicine

## 2019-04-07 ENCOUNTER — Other Ambulatory Visit: Payer: Self-pay

## 2019-04-07 ENCOUNTER — Ambulatory Visit (INDEPENDENT_AMBULATORY_CARE_PROVIDER_SITE_OTHER): Payer: PPO | Admitting: Family Medicine

## 2019-04-07 VITALS — BP 118/60 | HR 62 | Wt 157.0 lb

## 2019-04-07 DIAGNOSIS — Z1159 Encounter for screening for other viral diseases: Secondary | ICD-10-CM | POA: Diagnosis not present

## 2019-04-07 DIAGNOSIS — R634 Abnormal weight loss: Secondary | ICD-10-CM

## 2019-04-07 NOTE — Assessment & Plan Note (Addendum)
2lb weight loss since last visit last month. Continues to be asymptomatic. UTD on age appropriate cancer screenings. LDH, TSH, ESR obtained at last visit and within normal limits. Has virtual GI visit tomorrow given anemia and h/o polyps, may need f/u colonoscopy. Will obtain anemia panel, hep c antibody and HIV today.

## 2019-04-07 NOTE — Patient Instructions (Signed)
It was great to see you!  Our plans for today:  - We are checking some labs today, we will call you or send you a letter if they are abnormal.  - Come back in 3 months for a weight check. - Keep your appointment with the GI doctor tomorrow.  Take care and seek immediate care sooner if you develop any concerns.   Dr. [facility]

## 2019-04-09 LAB — HIV ANTIBODY (ROUTINE TESTING W REFLEX): HIV Screen 4th Generation wRfx: NONREACTIVE

## 2019-04-09 LAB — ANEMIA PANEL
Ferritin: 93 ng/mL (ref 15–150)
Folate, Hemolysate: 272 ng/mL
Folate, RBC: 798 ng/mL (ref >498)
Hematocrit: 34.1 % (ref 34.0–46.6)
Iron Saturation: 25 % (ref 15–55)
Iron: 72 ug/dL (ref 27–139)
Retic Ct Pct: 1.4 % (ref 0.6–2.6)
Total Iron Binding Capacity: 286 ug/dL (ref 250–450)
UIBC: 214 ug/dL (ref 118–369)
Vitamin B-12: 262 pg/mL (ref 232–1245)

## 2019-04-09 LAB — BASIC METABOLIC PANEL
BUN/Creatinine Ratio: 18 (ref 12–28)
BUN: 18 mg/dL (ref 8–27)
CO2: 23 mmol/L (ref 20–29)
Calcium: 10.2 mg/dL (ref 8.7–10.3)
Chloride: 103 mmol/L (ref 96–106)
Creatinine, Ser: 1.02 mg/dL — ABNORMAL HIGH (ref 0.57–1.00)
GFR calc Af Amer: 64 mL/min/{1.73_m2} (ref >59)
GFR calc non Af Amer: 56 mL/min/{1.73_m2} — ABNORMAL LOW (ref >59)
Glucose: 118 mg/dL — ABNORMAL HIGH (ref 65–99)
Potassium: 3.8 mmol/L (ref 3.5–5.2)
Sodium: 143 mmol/L (ref 134–144)

## 2019-04-09 LAB — HCV COMMENT:

## 2019-04-09 LAB — HEPATITIS C ANTIBODY (REFLEX): HCV Ab: <0.1 s/co ratio (ref 0.0–0.9)

## 2019-04-18 DIAGNOSIS — Z86010 Personal history of colonic polyps: Secondary | ICD-10-CM | POA: Diagnosis not present

## 2019-04-18 DIAGNOSIS — R634 Abnormal weight loss: Secondary | ICD-10-CM | POA: Diagnosis not present

## 2019-04-18 DIAGNOSIS — D649 Anemia, unspecified: Secondary | ICD-10-CM | POA: Diagnosis not present

## 2019-04-18 DIAGNOSIS — E119 Type 2 diabetes mellitus without complications: Secondary | ICD-10-CM | POA: Diagnosis not present

## 2019-04-18 DIAGNOSIS — Z853 Personal history of malignant neoplasm of breast: Secondary | ICD-10-CM | POA: Diagnosis not present

## 2019-05-15 DIAGNOSIS — Z20828 Contact with and (suspected) exposure to other viral communicable diseases: Secondary | ICD-10-CM | POA: Diagnosis not present

## 2019-05-27 DIAGNOSIS — Z20828 Contact with and (suspected) exposure to other viral communicable diseases: Secondary | ICD-10-CM | POA: Diagnosis not present

## 2019-06-05 DIAGNOSIS — D649 Anemia, unspecified: Secondary | ICD-10-CM | POA: Diagnosis not present

## 2019-06-21 ENCOUNTER — Other Ambulatory Visit: Payer: Self-pay | Admitting: Family Medicine

## 2019-06-21 DIAGNOSIS — I10 Essential (primary) hypertension: Secondary | ICD-10-CM

## 2019-06-23 ENCOUNTER — Encounter: Payer: Self-pay | Admitting: Family Medicine

## 2019-06-23 ENCOUNTER — Other Ambulatory Visit: Payer: Self-pay

## 2019-06-23 ENCOUNTER — Ambulatory Visit (INDEPENDENT_AMBULATORY_CARE_PROVIDER_SITE_OTHER): Payer: PPO | Admitting: Family Medicine

## 2019-06-23 VITALS — BP 108/67 | HR 73 | Wt 159.2 lb

## 2019-06-23 DIAGNOSIS — Z0000 Encounter for general adult medical examination without abnormal findings: Secondary | ICD-10-CM

## 2019-06-23 DIAGNOSIS — R634 Abnormal weight loss: Secondary | ICD-10-CM | POA: Diagnosis not present

## 2019-06-23 DIAGNOSIS — M1A9XX0 Chronic gout, unspecified, without tophus (tophi): Secondary | ICD-10-CM | POA: Diagnosis not present

## 2019-06-23 MED ORDER — PREDNISONE 10 MG PO TABS: 30.0000 mg | ORAL_TABLET | Freq: Every day | ORAL | 0 refills | Status: DC

## 2019-06-23 NOTE — Assessment & Plan Note (Signed)
1 week history of chronic gout flare in bilateral first MTP joints.  Given recent increase in creatinine 2/2 NSAID use, will provide short steroid course.  Will repeat BMP today.

## 2019-06-23 NOTE — Patient Instructions (Signed)
It was great to see you!  Our plans for today:  - Take prednisone for your gout pain. Take this until it has been 2-3 days after your symptoms resolve. - We are checking some labs today, we will call you or send you a letter with these results.   Take care and seek immediate care sooner if you develop any concerns.   Dr. [facility]

## 2019-06-23 NOTE — Progress Notes (Signed)
  Subjective:   Patient ID: Ashley Sloan    DOB: [birth year], 68 y.o. female   MRN: [id number]  Ashley Sloan is a 68 y.o. female with a history of HTN, T2 DM, HLD, gout here for   Gout - For the past has had bilateral great toe joint pain.  - Describes as sharp and worse with walking.  - Feels like prior gout flares. - has Tylenol for pain without relief - no trauma, wounds  Unintentional weight loss Previously reports she had poor appetite as she is grieving the loss of her husband.  Reports her appetite has been much better recently.  She did see GI with negative Hemoccult.  She denies any blood in her stool.  She has not been taking any NSAIDs.  Review of Systems:  Per HPI.  Medications and smoking status reviewed.  Objective:   BP 108/67   Pulse 73   Wt 159 lb 3.2 oz (72.2 kg)   SpO2 99%   BMI 31.09 kg/m  Vitals and nursing note reviewed.  General: Overweight elderly female, in no acute distress with non-toxic appearance Skin: warm, dry.  Very slightly swollen first MTP joint bilaterally, R>L, tender to palpation.  Pain with AROM of bilateral toes. Extremities: warm and well perfused, normal tone  Assessment & Plan:   Gout 1 week history of chronic gout flare in bilateral first MTP joints.  Given recent increase in creatinine 2/2 NSAID use, will provide short steroid course.  Will repeat BMP today.  Unintentional weight loss Has gained 2 pounds since last visit, initial weight loss likely secondary to grief.  GI visit with negative Hemoccult, has not been taking NSAIDs.  Will repeat BMP and CBC today.  Orders Placed This Encounter  Procedures  . Basic Metabolic Panel  . CBC   Meds ordered this encounter  Medications  . predniSONE (DELTASONE) 10 MG tablet    Sig: Take 3 tablets (30 mg total) by mouth daily with breakfast. Take until 2-3 days after symptoms resolve.    Dispense:  10 tablet    Refill:  0    Rory Percy, DO PGY-3, [facility] 06/23/2019 11:26 AM

## 2019-06-23 NOTE — Assessment & Plan Note (Addendum)
Has gained 2 pounds since last visit, initial weight loss likely secondary to grief.  GI visit with negative Hemoccult, has not been taking NSAIDs.  Will repeat BMP and CBC today.

## 2019-06-24 LAB — BASIC METABOLIC PANEL
BUN/Creatinine Ratio: 13 (ref 12–28)
BUN: 16 mg/dL (ref 8–27)
CO2: 24 mmol/L (ref 20–29)
Calcium: 9.9 mg/dL (ref 8.7–10.3)
Chloride: 99 mmol/L (ref 96–106)
Creatinine, Ser: 1.22 mg/dL — ABNORMAL HIGH (ref 0.57–1.00)
GFR calc Af Amer: 52 mL/min/{1.73_m2} — ABNORMAL LOW (ref >59)
GFR calc non Af Amer: 45 mL/min/{1.73_m2} — ABNORMAL LOW (ref >59)
Glucose: 205 mg/dL — ABNORMAL HIGH (ref 65–99)
Potassium: 4.1 mmol/L (ref 3.5–5.2)
Sodium: 138 mmol/L (ref 134–144)

## 2019-06-24 LAB — CBC
Hematocrit: 35.3 % (ref 34.0–46.6)
Hemoglobin: 11.6 g/dL (ref 11.1–15.9)
MCH: 29.9 pg (ref 26.6–33.0)
MCHC: 32.9 g/dL (ref 31.5–35.7)
MCV: 91 fL (ref 79–97)
Platelets: 238 10*3/uL (ref 150–450)
RBC: 3.88 x10E6/uL (ref 3.77–5.28)
RDW: 11.7 % (ref 11.7–15.4)
WBC: 6.9 10*3/uL (ref 3.4–10.8)

## 2019-06-30 ENCOUNTER — Other Ambulatory Visit: Payer: Self-pay | Admitting: Family Medicine

## 2019-06-30 ENCOUNTER — Other Ambulatory Visit (INDEPENDENT_AMBULATORY_CARE_PROVIDER_SITE_OTHER): Payer: PPO | Admitting: *Deleted

## 2019-06-30 DIAGNOSIS — R7989 Other specified abnormal findings of blood chemistry: Secondary | ICD-10-CM | POA: Diagnosis not present

## 2019-06-30 LAB — POCT UA - MICROALBUMIN
Albumin/Creatinine Ratio, Urine, POC: <30
Creatinine, POC: 100 mg/dL
Microalbumin Ur, POC: 10 mg/L

## 2019-07-11 DIAGNOSIS — Z20828 Contact with and (suspected) exposure to other viral communicable diseases: Secondary | ICD-10-CM | POA: Diagnosis not present

## 2019-07-18 DIAGNOSIS — Z20828 Contact with and (suspected) exposure to other viral communicable diseases: Secondary | ICD-10-CM | POA: Diagnosis not present

## 2019-07-22 DIAGNOSIS — Z20828 Contact with and (suspected) exposure to other viral communicable diseases: Secondary | ICD-10-CM | POA: Diagnosis not present

## 2019-07-25 DIAGNOSIS — Z20828 Contact with and (suspected) exposure to other viral communicable diseases: Secondary | ICD-10-CM | POA: Diagnosis not present

## 2019-07-29 ENCOUNTER — Ambulatory Visit (INDEPENDENT_AMBULATORY_CARE_PROVIDER_SITE_OTHER): Payer: PPO | Admitting: Family Medicine

## 2019-07-29 ENCOUNTER — Other Ambulatory Visit: Payer: Self-pay

## 2019-07-29 ENCOUNTER — Encounter: Payer: Self-pay | Admitting: Family Medicine

## 2019-07-29 VITALS — BP 140/72 | HR 72 | Wt 164.2 lb

## 2019-07-29 DIAGNOSIS — M542 Cervicalgia: Secondary | ICD-10-CM | POA: Diagnosis not present

## 2019-07-29 DIAGNOSIS — M25512 Pain in left shoulder: Secondary | ICD-10-CM | POA: Diagnosis not present

## 2019-07-29 DIAGNOSIS — M25519 Pain in unspecified shoulder: Secondary | ICD-10-CM

## 2019-07-29 MED ORDER — BACLOFEN 10 MG PO TABS: 10.0000 mg | ORAL_TABLET | Freq: Three times a day (TID) | ORAL | 0 refills | Status: DC | PRN

## 2019-07-29 NOTE — Patient Instructions (Addendum)
It was so wonderful to see you today.  I am so sorry that your shoulder and neck have been bothering you.  You may use Tylenol 1000 mg 3 times a day, can also add a 500 mg breakthrough if needed.  I also recommend you start using Voltaren gel or blue emu cream on your shoulder/neck several times a day.  I will also send in a medication called baclofen, this is a muscle relaxant, please use this at home and make sure that you handle it well first.  They can make you drowsy.  I also placed a referral for you to go to physical therapy, if you do not hear from them soon please let us know.   I would like you to follow-up in several weeks or sooner if needed if this becomes more severe, any new onset weakness, or sensation changes.

## 2019-07-29 NOTE — Progress Notes (Signed)
Subjective:    Patient ID: Ashley Sloan, female    DOB: Feb 19, 1951, 69 y.o.   MRN: [id number]   CC: Muscle spasms  HPI: [name] is a 69 year old female with hypertension, diabetes, hyperlipidemia, and gout presenting discuss the following:  Shoulder/neck pain: Reports onset of left-sided shoulder/neck that she noted when she woke up on Wednesday, 1/20.  Wonders if she slept on it funny, denies any trauma to the region.  Requesting a muscle relaxant.  Describes the pain as "just hurts, grabs me." She feels like the soreness initially started in her shoulder and will have muscle spasms that go up into her neck/upper back and then down her left arm.  Unsure if any particular movements exacerbate it, but hurts with extremes of motion.  Seems to be worse at night.  Denies any numbness, tingling, popping/clicking/locking, or weakness associated with this.  Denies any concurrent chest pain, dyspnea on exertion, lightheadedness/dizziness, diaphoresis.  She has been using Tylenol and ice/heat but does not seem to help much.  Does not take NSAIDs due to previously being told not take these anymore by her primary provider.  She has a known history of arthritis and previous bursitis, however not sure what arm it was.  She is not interested in trying any injections today.   Objective:  BP 140/72   Pulse 72   Wt 164 lb 3.2 oz (74.5 kg)   SpO2 99%   BMI 32.07 kg/m  Vitals and nursing note reviewed  General: NAD, pleasant Cardiac: RRR, normal heart sounds, no murmurs Respiratory: Unlabored breathing Extremities: no edema or cyanosis. WWP. Skin: warm and dry, no rashes noted Neuro/MSK: alert and oriented Left Shoulder: Inspection reveals no obvious deformity, atrophy, or asymmetry. No bruising. No swelling Tender to palpation over [facility] joint and left trapezius, nontender to cervical spinous process Full ROM in flexion, abduction, internal/external rotation however does elicit pain around shoulder  NV intact distally Normal scapular function observed. Special Tests:  - Impingement: Neg Hawkins and empty can sign, but positive neers - Supraspinatous: Negative empty can.  5/5 strength with resisted flexion at 20 degrees - Infraspinatous/Teres Minor: 5/5 strength with ER - Subscapularis: 5/5 strength with IR - Biceps tendon: Negative Speeds - Labrum: Positive Obriens, negative clunk, good stability - AC Joint: Positive cross arm test - Negative apprehension test - No painful arc and no drop arm sign  Cervical spine:  Nontender to cervical spinous process, however tenderness along paraspinal musculature. Full ROM, elicits tenderness with rotation and side bending to the left. Spurling's positive to the left, elicits pain only.  Negative to the right.   Assessment & Plan:   Neck and shoulder pain Acute, difficult to assess whether primary pain is originating from her cervical spine vs left shoulder, may potentially be both. Reassuringly with full ROM and strength, however elicited tenderness on several different shoulder/cervical movements. Differential including cervical radiculopathy, AC arthritis, RTC tendinopathy, shoulder impingement/bursitis, labral pathology.  Recommended conservative care including increasing Tylenol, topical therapy such as Voltaren/blue emu cream, and sent in baclofen 10mg  PRN for spasms. Has been told to avoid NSAIDs by her primary provider. She is not interested in trying a shoulder injection today for relief/help decipher pain location.  Will additionally refer for physical therapy, believe she would benefit from a few sessions in person prior to continuing exercises at home.  Return precautions discussed including worsening pain, sensation deficits, development of weakness.    Follow-up in 2-3 weeks to assess progress, may  be able to better decipher etiology at that time and continue work-up as appropriate.  Case discussed with Dr. Walker Kehr.  [facility] Resident PGY-2

## 2019-08-03 ENCOUNTER — Encounter: Payer: Self-pay | Admitting: Family Medicine

## 2019-08-03 DIAGNOSIS — M25519 Pain in unspecified shoulder: Secondary | ICD-10-CM | POA: Insufficient documentation

## 2019-08-03 NOTE — Assessment & Plan Note (Addendum)
Acute, difficult to assess whether primary pain is originating from her cervical spine vs left shoulder, may potentially be both. Reassuringly with full ROM and strength, however elicited tenderness on several different shoulder/cervical movements. Differential including cervical radiculopathy, AC arthritis, RTC tendinopathy, shoulder impingement/bursitis, labral pathology.  Recommended conservative care including increasing Tylenol, topical therapy such as Voltaren/blue emu cream, and sent in baclofen 10mg  PRN for spasms. Has been told to avoid NSAIDs by her primary provider. She is not interested in trying a shoulder injection today for relief/help decipher pain location.  Will additionally refer for physical therapy, believe she would benefit from a few sessions in person prior to continuing exercises at home.  Return precautions discussed including worsening pain, sensation deficits, development of weakness.

## 2019-08-11 ENCOUNTER — Other Ambulatory Visit: Payer: Self-pay

## 2019-08-11 ENCOUNTER — Ambulatory Visit: Payer: PPO | Attending: Family Medicine

## 2019-08-11 DIAGNOSIS — R293 Abnormal posture: Secondary | ICD-10-CM | POA: Insufficient documentation

## 2019-08-11 DIAGNOSIS — M25512 Pain in left shoulder: Secondary | ICD-10-CM

## 2019-08-11 DIAGNOSIS — M436 Torticollis: Secondary | ICD-10-CM | POA: Insufficient documentation

## 2019-08-11 DIAGNOSIS — R252 Cramp and spasm: Secondary | ICD-10-CM | POA: Insufficient documentation

## 2019-08-11 DIAGNOSIS — M542 Cervicalgia: Secondary | ICD-10-CM | POA: Insufficient documentation

## 2019-08-11 NOTE — Patient Instructions (Signed)
Cervical rotation RT / LT,,  Cervical flexion ,cervical and scapula retraction  X 2-3 reps 2-5x/day  5-10 sec hold

## 2019-08-11 NOTE — Therapy (Signed)
[place], [place], [address] Phone: [phone]   Fax:  [phone]  Physical Therapy Evaluation  Patient Details  Name: Ashley Sloan MRN: [id number] Date of Birth: 1951-02-04 Referring Provider (PT): Sherren Mocha McDiarmid,    Encounter Date: 08/11/2019  PT End of Session - 08/11/19 1058    Visit Number  1    Number of Visits  12    Date for PT Re-Evaluation  09/19/19    Authorization Type  Health team    PT Start Time  [id number]    PT Stop Time  1130    PT Time Calculation (min)  45 min    Activity Tolerance  Patient tolerated treatment well    Behavior During Therapy  [facility] for tasks assessed/performed       Past Medical History:  Diagnosis Date  . Cancer ([place]) 1991   Breast-left  . Diabetes mellitus   . Hyperlipidemia   . Hypertension   . Left breast mass     Past Surgical History:  Procedure Laterality Date  . BREAST EXCISIONAL BIOPSY Left    benign  . BREAST LUMPECTOMY Left 1991  . BREAST SURGERY  1991   Lumpectomy  . [place]  . MASS EXCISION Left 11/19/2017   Procedure: EXCISION LEFT BREAST  MASS ERAS PATHWAY;  Surgeon: Excell Seltzer, MD;  Location: [place];  Service: General;  Laterality: Left;    There were no vitals filed for this visit.   Subjective Assessment - 08/11/19 1052    Subjective  She reports LT shoulder pain with spasm  also including traps to lateral upper and lower arm.   Now better but sore posterior LT shoulder.  no injury,    Limitations  Lifting    Diagnostic tests  None    Patient Stated Goals  to have pain go away    Currently in Pain?  Yes    Pain Score  5     Pain Location  Shoulder    Pain Orientation  Left    Pain Descriptors / Indicators  Sore    Pain Onset  More than a month ago    Pain Frequency  Constant    Aggravating Factors   Nothing specific    Pain Relieving Factors  pain only laasts brief periods, tylenol.    Multiple Pain Sites  No         [facility] PT Assessment - 08/11/19 0001      Assessment   Medical Diagnosis  LT shoulder pain    Referring Provider (PT)  Todd McDiarmid,     Onset Date/Surgical Date  --   4-6 weeks ago   Hand Dominance  Left    Prior Therapy  No      Precautions   Precautions  None      Restrictions   Weight Bearing Restrictions  No      Balance Screen   Has the patient fallen in the past 6 months  No      Prior Function   Level of Independence  Independent    Vocation  Full time employment    Vocation Requirements  environmental services      Cognition   Overall Cognitive Status  Within Functional Limits for tasks assessed      Observation/Other Assessments   Focus on Therapeutic Outcomes (FOTO)   40% limited      ROM / Strength   AROM / PROM /  Strength  AROM;Strength      AROM   Overall AROM Comments  passive RT rotation and sidebending WNL LT limited and pain.     AROM Assessment Site  Cervical    Cervical Flexion  30    Cervical Extension  35   neck pain   Cervical - Right Side Bend  20    Cervical - Left Side Bend  22   left neck pain   Cervical - Right Rotation  55    Cervical - Left Rotation  45   LT neck pain     Strength   Overall Strength Comments  Normal UE                 Objective measurements completed on examination: See above findings.              PT Education - 08/11/19 1057    Education Details  poc, HEP    Person(s) Educated  Patient    Methods  Explanation;Demonstration;Tactile cues;Verbal cues;Handout    Comprehension  Verbalized understanding;Returned demonstration       PT Short Term Goals - 08/11/19 1146      PT SHORT TERM GOAL #1   Title  She will be independent with inital HEP    Time  2    Period  Weeks    Status  New      PT SHORT TERM GOAL #2   Title  She will report pain as intermittant.    Time  3    Status  New      PT SHORT TERM GOAL #3   Title  She will improve LT  cervical rotation to 55 degrees before pain starts    Time  3    Period  Weeks    Status  New        PT Long Term Goals - 08/11/19 1209      PT LONG TERM GOAL #1   Title  She will be independent with all hEP issued    Time  6    Period  Weeks    Status  New      PT LONG TERM GOAL #2   Title  She will report 75% decreased in LT shoulder and neck pain/    Time  6    Period  Weeks    Status  New      PT LONG TERM GOAL #3   Title  cervical rotation tpo 60 degrees bilaterally  and 30 degrees sidebending without pain.    Time  6    Period  Weeks    Status  New      PT LONG TERM GOAL #4   Title  FOTO improved to 30% limited    Time  6    Period  Weeks    Status  New      PT LONG TERM GOAL #5   Title  She will report normal use of LT arm for work and home tasks    Time  6    Period  Weeks    Status  New             Plan - 08/11/19 1234    Clinical Impression Statement  [name] presents with neck and shoulder pain on the LT and decreased cervical ROM moving to the LT . Shoulder ROM WNL. Forward head posture  and rounded shoulders. Spasm noted in traps and paraspinals on LT.  She should improve with skilled PT and consistent HEP.    Personal Factors and Comorbidities  Age    Examination-Activity Limitations  Reach Overhead    Stability/Clinical Decision Making  Stable/Uncomplicated    Clinical Decision Making  Low    Rehab Potential  Good    PT Frequency  2x / week    PT Duration  6 weeks    PT Treatment/Interventions  Cryotherapy;Iontophoresis 4mg /ml Dexamethasone;Moist Heat;Ultrasound;Therapeutic exercise;Therapeutic activities;Patient/family education;Manual techniques;Dry needling;Taping    PT Next Visit Plan  REview HEP and add bands for strength.  Manual and modlaities as neeed for ROM and de cr pain    PT Home Exercise Plan  Cervical and scapula retraction, cervical rotation and flexion    Consulted and Agree with Plan of Care  Patient       Patient will  benefit from skilled therapeutic intervention in order to improve the following deficits and impairments:  Increased muscle spasms, Pain, Postural dysfunction, Decreased range of motion  Visit Diagnosis: Left shoulder pain, unspecified chronicity - Plan: PT plan of care cert/re-cert  Stiffness of neck - Plan: PT plan of care cert/re-cert  Cramp and spasm - Plan: PT plan of care cert/re-cert  Abnormal posture - Plan: PT plan of care cert/re-cert  Cervicalgia - Plan: PT plan of care cert/re-cert     Problem List Patient Active Problem List   Diagnosis Date Noted  . Neck and shoulder pain 08/03/2019  . Unintentional weight loss 03/04/2019  . Cerumen impaction 03/04/2019  . Abdominal pain 03/08/2018  . Breast lesion 10/05/2017  . Healthcare maintenance 03/17/2016  . Gout 04/16/2014  . Foot deformity 03/16/2014  . HLD (hyperlipidemia) 03/03/2014  . HTN (hypertension), benign 12/19/2011  . Diabetes mellitus, type 2 ([place]) 12/19/2011    Darrel Hoover  PT 08/11/2019, 1:15 PM  [facility] [address] [place], [place], [address] Phone: [phone]   Fax:  [phone]  Name: Ashley Sloan MRN: [id number] Date of Birth: May 04, 1951

## 2019-08-22 ENCOUNTER — Other Ambulatory Visit: Payer: Self-pay

## 2019-08-22 ENCOUNTER — Encounter: Payer: Self-pay | Admitting: Physical Therapy

## 2019-08-22 ENCOUNTER — Ambulatory Visit: Payer: PPO | Admitting: Physical Therapy

## 2019-08-22 DIAGNOSIS — R293 Abnormal posture: Secondary | ICD-10-CM

## 2019-08-22 DIAGNOSIS — M25512 Pain in left shoulder: Secondary | ICD-10-CM | POA: Diagnosis not present

## 2019-08-22 DIAGNOSIS — M542 Cervicalgia: Secondary | ICD-10-CM

## 2019-08-22 DIAGNOSIS — M436 Torticollis: Secondary | ICD-10-CM

## 2019-08-22 DIAGNOSIS — R252 Cramp and spasm: Secondary | ICD-10-CM

## 2019-08-22 NOTE — Patient Instructions (Signed)
Rockwood Yellow band series x 10-15 reps 2 x per day

## 2019-08-22 NOTE — Therapy (Signed)
[place], [place], [address] Phone: [phone]   Fax:  [phone]  Physical Therapy Treatment  Patient Details  Name: Ashley Sloan MRN: [id number] Date of Birth: [birth year] Referring Provider (PT): Sherren Mocha McDiarmid,    Encounter Date: 08/22/2019  PT End of Session - 08/22/19 1229    Visit Number  2    Number of Visits  12    Date for PT Re-Evaluation  09/19/19    Authorization Type  Health team    PT Start Time  1150    PT Stop Time  1228    PT Time Calculation (min)  38 min       Past Medical History:  Diagnosis Date  . Cancer ([employer]) 1991   Breast-left  . Diabetes mellitus   . Hyperlipidemia   . Hypertension   . Left breast mass     Past Surgical History:  Procedure Laterality Date  . BREAST EXCISIONAL BIOPSY Left    benign  . BREAST LUMPECTOMY Left 1991  . BREAST SURGERY  1991   Lumpectomy  . [place]  . MASS EXCISION Left 11/19/2017   Procedure: EXCISION LEFT BREAST  MASS ERAS PATHWAY;  Surgeon: Excell Seltzer, MD;  Location: [facility];  Service: General;  Laterality: Left;    There were no vitals filed for this visit.  Subjective Assessment - 08/22/19 1151    Subjective  Shoulder started hurting on Tuesday, had a spasm and not it is just hurting when I do certain movements [id number].                       [facility] Adult PT Treatment/Exercise - 08/22/19 0001      Self-Care   Self-Care  Other Self-Care Comments    Other Self-Care Comments   Use of tennis ball for self trigger point release at wall       Neck Exercises: Theraband   Rows  20 reps    Rows Limitations  level 1     Other Theraband Exercises  rockwood yellow  x 10 each       Neck Exercises: Seated   Other Seated Exercise  Scap squeeze x 10, chin tuck x 10 per HEP     Other Seated Exercise  Cervical AROM flexion, rotation per HEP      Shoulder Exercises: Supine   Other  Supine Exercises  supine pressups and pullovers       Shoulder Exercises: Pulleys   Flexion  2 minutes      Manual Therapy   Manual therapy comments  soft tissue work to left upper trap and left thoracic, trigger point release x 3                PT Short Term Goals - 08/11/19 1146      PT SHORT TERM GOAL #1   Title  She will be independent with inital HEP    Time  2    Period  Weeks    Status  New      PT SHORT TERM GOAL #2   Title  She will report pain as intermittant.    Time  3    Status  New      PT SHORT TERM GOAL #3   Title  She will improve LT cervical rotation to 55 degrees before pain starts    Time  3    Period  Weeks  Status  New        PT Long Term Goals - 08/11/19 1209      PT LONG TERM GOAL #1   Title  She will be independent with all hEP issued    Time  6    Period  Weeks    Status  New      PT LONG TERM GOAL #2   Title  She will report 75% decreased in LT shoulder and neck pain/    Time  6    Period  Weeks    Status  New      PT LONG TERM GOAL #3   Title  cervical rotation tpo 60 degrees bilaterally  and 30 degrees sidebending without pain.    Time  6    Period  Weeks    Status  New      PT LONG TERM GOAL #4   Title  FOTO improved to 30% limited    Time  6    Period  Weeks    Status  New      PT LONG TERM GOAL #5   Title  She will report normal use of LT arm for work and home tasks    Time  6    Period  Weeks    Status  New            Plan - 08/22/19 1222    Clinical Impression Statement  Pt arrives reporting no pain at rest but feels pain with some movements. Reviewed HEP and progressed with scapular bands and RTC strenthening. Soft tissue work performed to left thoracic and upper trap. SHe was instructed in how to use tennis ball for self trigger point release. No increased pain post session.    PT Next Visit Plan  REview HEP and add bands for strength.  Manual and modlaities as neeed for ROM and de cr pain    PT  Home Exercise Plan  Cervical and scapula retraction, cervical rotation and flexion, added rows and rockwood yellow       Patient will benefit from skilled therapeutic intervention in order to improve the following deficits and impairments:  Increased muscle spasms, Pain, Postural dysfunction, Decreased range of motion  Visit Diagnosis: Left shoulder pain, unspecified chronicity  Stiffness of neck  Cramp and spasm  Abnormal posture  Cervicalgia     Problem List Patient Active Problem List   Diagnosis Date Noted  . Neck and shoulder pain 08/03/2019  . Unintentional weight loss 03/04/2019  . Cerumen impaction 03/04/2019  . Abdominal pain 03/08/2018  . Breast lesion 10/05/2017  . Healthcare maintenance 03/17/2016  . Gout 04/16/2014  . Foot deformity 03/16/2014  . HLD (hyperlipidemia) 03/03/2014  . HTN (hypertension), benign 12/19/2011  . Diabetes mellitus, type 2 ([place]) 12/19/2011    Dorene Ar, PTA 08/22/2019, 12:30 PM  [place], [place], [address] Phone: [phone]   Fax:  [phone]  Name: KATHA ROZARIO MRN: [id number] Date of Birth: [birth year]

## 2019-08-25 ENCOUNTER — Other Ambulatory Visit: Payer: Self-pay

## 2019-08-25 ENCOUNTER — Encounter: Payer: Self-pay | Admitting: Physical Therapy

## 2019-08-25 ENCOUNTER — Ambulatory Visit: Payer: PPO | Admitting: Physical Therapy

## 2019-08-25 DIAGNOSIS — M25512 Pain in left shoulder: Secondary | ICD-10-CM | POA: Diagnosis not present

## 2019-08-25 DIAGNOSIS — M542 Cervicalgia: Secondary | ICD-10-CM

## 2019-08-25 DIAGNOSIS — M436 Torticollis: Secondary | ICD-10-CM

## 2019-08-25 DIAGNOSIS — R252 Cramp and spasm: Secondary | ICD-10-CM

## 2019-08-25 DIAGNOSIS — R293 Abnormal posture: Secondary | ICD-10-CM

## 2019-08-25 NOTE — Therapy (Signed)
[place], [place], [address] Phone: [phone]   Fax:  [phone]  Physical Therapy Treatment  Patient Details  Name: MONIE SHERE MRN: [id number] Date of Birth: [birth year] Referring Provider (PT): Sherren Mocha McDiarmid,    Encounter Date: 08/25/2019  PT End of Session - 08/25/19 1117    Visit Number  3    Number of Visits  12    Date for PT Re-Evaluation  09/19/19    Authorization Type  Health team    PT Start Time  4008    PT Stop Time  1125    PT Time Calculation (min)  40 min    Activity Tolerance  Patient tolerated treatment well    Behavior During Therapy  [facility] for tasks assessed/performed       Past Medical History:  Diagnosis Date  . Cancer ([place]) 1991   Breast-left  . Diabetes mellitus   . Hyperlipidemia   . Hypertension   . Left breast mass     Past Surgical History:  Procedure Laterality Date  . BREAST EXCISIONAL BIOPSY Left    benign  . BREAST LUMPECTOMY Left 1991  . BREAST SURGERY  1991   Lumpectomy  . [place]  . MASS EXCISION Left 11/19/2017   Procedure: EXCISION LEFT BREAST  MASS ERAS PATHWAY;  Surgeon: Excell Seltzer, MD;  Location: [facility];  Service: General;  Laterality: Left;    There were no vitals filed for this visit.  Subjective Assessment - 08/25/19 1053    Subjective  Pt arriving to therapy reporting 3/10 pain in her left shoulder. Pt reporting increased pain when turning in her bed.    Limitations  Lifting    Diagnostic tests  None    Currently in Pain?  Yes    Pain Score  3     Pain Location  Shoulder    Pain Orientation  Left    Pain Descriptors / Indicators  Aching    Pain Type  Chronic pain    Pain Onset  More than a month ago                       [facility] Adult PT Treatment/Exercise - 08/25/19 0001      Neck Exercises: Theraband   Rows  20 reps    Rows Limitations  Level 2    Other Theraband  Exercises  rockwood red (level 2) x 10 each       Neck Exercises: Seated   Other Seated Exercise  Scap squeeze x 10, chin tuck x 10 per HEP     Other Seated Exercise  upper trap stretch x 3 holding 5-8 seconds each side x 3 reps      Shoulder Exercises: Supine   Other Supine Exercises  supine serratus punches x 10 using cane      Modalities   Modalities  Moist Heat      Moist Heat Therapy   Number Minutes Moist Heat  8 Minutes    Moist Heat Location  Shoulder      Manual Therapy   Manual Therapy  Soft tissue mobilization    Manual therapy comments  8 minutes    Soft tissue mobilization  trigger point release to left upper trap and L thoracic paraspinals,                PT Short Term Goals - 08/11/19 1146  PT SHORT TERM GOAL #1   Title  She will be independent with inital HEP    Time  2    Period  Weeks    Status  New      PT SHORT TERM GOAL #2   Title  She will report pain as intermittant.    Time  3    Status  New      PT SHORT TERM GOAL #3   Title  She will improve LT cervical rotation to 55 degrees before pain starts    Time  3    Period  Weeks    Status  New        PT Long Term Goals - 08/25/19 1120      PT LONG TERM GOAL #1   Title  She will be independent with all hEP issued    Time  6    Period  Weeks    Status  On-going      PT LONG TERM GOAL #2   Title  She will report 75% decreased in LT shoulder and neck pain/    Time  6    Period  Weeks    Status  On-going      PT LONG TERM GOAL #3   Title  cervical rotation tpo 60 degrees bilaterally  and 30 degrees sidebending without pain.    Period  Weeks    Status  On-going      PT LONG TERM GOAL #4   Title  FOTO improved to 30% limited    Period  Weeks    Status  New      PT LONG TERM GOAL #5   Title  She will report normal use of LT arm for work and home tasks    Time  6    Period  Weeks    Status  New            Plan - 08/25/19 1118    Clinical Impression Statement  Pt  arriving to therapy reporting 3/10 pain in her left shoulder and left thoracic spine. Pt tolerating exercises well. Pt reporting relief following STM to L shoulder, and scapula mobs.Pt with active trigger points noted in upper trap, manual release performed.  Continue skilled PT to progress toward goals met.    Personal Factors and Comorbidities  Age    Examination-Activity Limitations  Reach Overhead    Rehab Potential  Good    PT Frequency  2x / week    PT Treatment/Interventions  Cryotherapy;Iontophoresis 63m/ml Dexamethasone;Moist Heat;Ultrasound;Therapeutic exercise;Therapeutic activities;Patient/family education;Manual techniques;Dry needling;Taping    PT Next Visit Plan  REview HEP and add bands for strength.  Manual and modlaities as neeed for ROM and de cr pain    PT Home Exercise Plan  Cervical and scapula retraction, cervical rotation and flexion, added rows and rockwood yellow    Consulted and Agree with Plan of Care  Patient       Patient will benefit from skilled therapeutic intervention in order to improve the following deficits and impairments:  Increased muscle spasms, Pain, Postural dysfunction, Decreased range of motion  Visit Diagnosis: Stiffness of neck  Left shoulder pain, unspecified chronicity  Cramp and spasm  Abnormal posture  Cervicalgia     Problem List Patient Active Problem List   Diagnosis Date Noted  . Neck and shoulder pain 08/03/2019  . Unintentional weight loss 03/04/2019  . Cerumen impaction 03/04/2019  . Abdominal pain 03/08/2018  . Breast lesion 10/05/2017  .  Healthcare maintenance 03/17/2016  . Gout 04/16/2014  . Foot deformity 03/16/2014  . HLD (hyperlipidemia) 03/03/2014  . HTN (hypertension), benign 12/19/2011  . Diabetes mellitus, type 2 ([place]) 12/19/2011    Oretha Caprice, PT 08/25/2019, 11:22 AM  [facility] [address] [place], [place], [address] Phone: [phone]    Fax:  [phone]  Name: SHANISHA LECH MRN: [id number] Date of Birth: June 21, 1951

## 2019-08-26 DIAGNOSIS — Z20822 Contact with and (suspected) exposure to covid-19: Secondary | ICD-10-CM | POA: Diagnosis not present

## 2019-09-02 DIAGNOSIS — Z20828 Contact with and (suspected) exposure to other viral communicable diseases: Secondary | ICD-10-CM | POA: Diagnosis not present

## 2019-09-05 ENCOUNTER — Ambulatory Visit: Payer: PPO | Attending: Family Medicine | Admitting: Physical Therapy

## 2019-09-05 ENCOUNTER — Encounter: Payer: Self-pay | Admitting: Physical Therapy

## 2019-09-05 ENCOUNTER — Other Ambulatory Visit: Payer: Self-pay

## 2019-09-05 DIAGNOSIS — R293 Abnormal posture: Secondary | ICD-10-CM | POA: Diagnosis not present

## 2019-09-05 DIAGNOSIS — M436 Torticollis: Secondary | ICD-10-CM | POA: Diagnosis not present

## 2019-09-05 DIAGNOSIS — M25512 Pain in left shoulder: Secondary | ICD-10-CM

## 2019-09-05 DIAGNOSIS — R252 Cramp and spasm: Secondary | ICD-10-CM | POA: Diagnosis not present

## 2019-09-05 DIAGNOSIS — M542 Cervicalgia: Secondary | ICD-10-CM | POA: Diagnosis not present

## 2019-09-05 NOTE — Therapy (Addendum)
[place], [place], [address] Phone: [phone]   Fax:  [phone]  Physical Therapy Treatment/Discharge  Patient Details  Name: Ashley Sloan MRN: [id number] Date of Birth: 02-Jun-1951 Referring Provider (PT): Sherren Mocha McDiarmid,    Encounter Date: 09/05/2019  PT End of Session - 09/05/19 1031    Visit Number  4    Number of Visits  12    Date for PT Re-Evaluation  09/19/19    Authorization Type  Health team    PT Start Time  1015    PT Stop Time  1046    PT Time Calculation (min)  31 min       Past Medical History:  Diagnosis Date  . Cancer ([place]) 1991   Breast-left  . Diabetes mellitus   . Hyperlipidemia   . Hypertension   . Left breast mass     Past Surgical History:  Procedure Laterality Date  . BREAST EXCISIONAL BIOPSY Left    benign  . BREAST LUMPECTOMY Left 1991  . BREAST SURGERY  1991   Lumpectomy  . [place]  . MASS EXCISION Left 11/19/2017   Procedure: EXCISION LEFT BREAST  MASS ERAS PATHWAY;  Surgeon: Excell Seltzer, MD;  Location: [facility];  Service: General;  Laterality: Left;    There were no vitals filed for this visit.      [facility] PT Assessment - 09/05/19 0001      Observation/Other Assessments   Focus on Therapeutic Outcomes (FOTO)   8% limited improved from 40 % limited (31% predicted)      AROM   Cervical - Right Side Bend  35    Cervical - Left Side Bend  40    Cervical - Right Rotation  62    Cervical - Left Rotation  62      Strength   Overall Strength Comments  Normal UE                    [facility] Adult PT Treatment/Exercise - 09/05/19 0001      Neck Exercises: Theraband   Rows  20 reps    Rows Limitations  Level 2    Other Theraband Exercises  rockwood red (level 2) x 10 each       Neck Exercises: Seated   Other Seated Exercise  upper trap stretch x 3 holding 5-8 seconds each side x 3 reps      Shoulder  Exercises: Pulleys   Flexion  2 minutes               PT Short Term Goals - 09/05/19 1025      PT SHORT TERM GOAL #1   Title  She will be independent with inital HEP    Baseline  performs intermittently    Time  2    Period  Weeks    Status  Achieved      PT SHORT TERM GOAL #2   Title  She will report pain as intermittant.    Status  Achieved      PT SHORT TERM GOAL #3   Title  She will improve LT cervical rotation to 55 degrees before pain starts    Baseline  62 AROM bilateral    Time  3    Period  Weeks    Status  Achieved        PT Long Term Goals - 09/05/19 1026  PT LONG TERM GOAL #1   Title  She will be independent with all hEP issued    Time  6    Period  Weeks    Status  Achieved      PT LONG TERM GOAL #2   Title  She will report 75% decreased in LT shoulder and neck pain/    Time  6    Period  Weeks    Status  Achieved      PT LONG TERM GOAL #3   Title  cervical rotation tpo 60 degrees bilaterally  and 30 degrees sidebending without pain.    Baseline  62 AROM bilateral    Time  6    Period  Weeks    Status  Achieved      PT LONG TERM GOAL #4   Title  FOTO improved to 30% limited    Baseline  8%    Time  6    Period  Weeks    Status  Achieved      PT LONG TERM GOAL #5   Title  She will report normal use of LT arm for work and home tasks    Baseline  no limitations    Time  6    Period  Weeks    Status  Achieved            Plan - 09/05/19 1046    Clinical Impression Statement  Pt reports no pain and no limitations with work or home tasks. She has improved Neck AROM and FOTO score improved to 8% limited. She is agreeable to discharge. Reports >75% improvement. All LTGS met.    PT Next Visit Plan  Discharged to HEP today    PT Home Exercise Plan  Cervical and scapula retraction, cervical rotation and flexion, added rows and rockwood yellow, red       Patient will benefit from skilled therapeutic intervention in order to  improve the following deficits and impairments:  Increased muscle spasms, Pain, Postural dysfunction, Decreased range of motion  Visit Diagnosis: Stiffness of neck  Left shoulder pain, unspecified chronicity  Cramp and spasm  Abnormal posture  Cervicalgia     Problem List Patient Active Problem List   Diagnosis Date Noted  . Neck and shoulder pain 08/03/2019  . Unintentional weight loss 03/04/2019  . Cerumen impaction 03/04/2019  . Abdominal pain 03/08/2018  . Breast lesion 10/05/2017  . Healthcare maintenance 03/17/2016  . Gout 04/16/2014  . Foot deformity 03/16/2014  . HLD (hyperlipidemia) 03/03/2014  . HTN (hypertension), benign 12/19/2011  . Diabetes mellitus, type 2 ([place]) 12/19/2011    Dorene Ar, PTA 09/05/2019, 10:48 AM  [place], [place], [address] Phone: [phone]   Fax:  [phone]  Name: Ashley Sloan MRN: [id number] Date of Birth: 1950/07/28  PHYSICAL THERAPY DISCHARGE SUMMARY  Visits from Start of Care: 4 Current functional level related to goals / functional outcomes: See above   Remaining deficits: See above   Education / Equipment: HEP Plan: Patient agrees to discharge.  Patient goals were met. Patient is being discharged due to meeting the stated rehab goals.  ?????    Pearson Forster PT  11/06/19

## 2019-09-08 ENCOUNTER — Ambulatory Visit: Payer: PPO

## 2019-09-09 DIAGNOSIS — Z20828 Contact with and (suspected) exposure to other viral communicable diseases: Secondary | ICD-10-CM | POA: Diagnosis not present

## 2019-09-16 DIAGNOSIS — Z20828 Contact with and (suspected) exposure to other viral communicable diseases: Secondary | ICD-10-CM | POA: Diagnosis not present

## 2019-11-08 ENCOUNTER — Other Ambulatory Visit: Payer: Self-pay | Admitting: Family Medicine

## 2019-11-08 DIAGNOSIS — I10 Essential (primary) hypertension: Secondary | ICD-10-CM

## 2019-11-10 NOTE — Telephone Encounter (Signed)
LM for patient to call back and schedule an appointment for her blood pressure. Melisssa Donner,CMA

## 2020-01-21 ENCOUNTER — Other Ambulatory Visit: Payer: Self-pay

## 2020-01-21 ENCOUNTER — Ambulatory Visit: Payer: PPO

## 2020-01-21 ENCOUNTER — Ambulatory Visit (INDEPENDENT_AMBULATORY_CARE_PROVIDER_SITE_OTHER): Payer: PPO

## 2020-01-21 VITALS — BP 108/58 | Ht 60.0 in | Wt 163.0 lb

## 2020-01-21 DIAGNOSIS — Z0000 Encounter for general adult medical examination without abnormal findings: Secondary | ICD-10-CM | POA: Diagnosis not present

## 2020-01-21 NOTE — Progress Notes (Signed)
Subjective:   Ashley Sloan is a 69 y.o. female who presents for Medicare Annual (Subsequent) preventive examination.  The patient consented to a virtual visit.  Review of Systems: Defer to PCP.  Cardiac Risk Factors include: advanced age (>78men, >54 women);diabetes mellitus;hypertension  Objective:   Vitals: BP (!) 108/58   Ht 5' (1.524 m)   Wt 163 lb (73.9 kg)   BMI 31.83 kg/m   Body mass index is 31.83 kg/m.  Advanced Directives 01/21/2020 08/11/2019 07/29/2019 06/23/2019 04/07/2019 03/04/2019 06/27/2018  Does Patient Have a Medical Advance Directive? No No No No No No No  Does patient want to make changes to medical advance directive? - - - - - - -  Would patient like information on creating a medical advance directive? No - Patient declined No - Patient declined No - Patient declined No - Patient declined No - Patient declined No - Patient declined No - Patient declined   Tobacco Social History   Tobacco Use  Smoking Status Former Smoker  . Packs/day: 0.50  . Years: 25.00  . Pack years: 12.50  . Types: Cigarettes  . Start date: 07/03/1964  . Quit date: 12/18/1982  . Years since quitting: 37.1  Smokeless Tobacco Never Used     Counseling given: No plans to restart.  Clinical Intake:  Pre-visit preparation completed: Yes  Pain Score: 5   How often do you need to have someone help you when you read instructions, pamphlets, or other written materials from your doctor or pharmacy?: 2 - Rarely What is the last grade level you completed in school?: high school  Interpreter Needed?: No  Past Medical History:  Diagnosis Date  . Cancer ([place]) 1991   Breast-left  . Diabetes mellitus   . Hyperlipidemia   . Hypertension   . Left breast mass    Past Surgical History:  Procedure Laterality Date  . BREAST EXCISIONAL BIOPSY Left    benign  . BREAST LUMPECTOMY Left 1991  . BREAST SURGERY  1991   Lumpectomy  . [place]  . MASS EXCISION Left 11/19/2017    Procedure: EXCISION LEFT BREAST  MASS ERAS PATHWAY;  Surgeon: Excell Seltzer, MD;  Location: [facility];  Service: General;  Laterality: Left;   Family History  Problem Relation Age of Onset  . Diabetes Mother   . Hypertension Mother   . COPD Mother   . Hypertension Father   . Heart disease Sister   . Hypertension Brother   . Diabetes Daughter    Social History   Socioeconomic History  . Marital status: Widowed    Spouse name: Not on file  . Number of children: 1  . Years of education: 76  . Highest education level: High school graduate  Occupational History  . Not on file  Tobacco Use  . Smoking status: Former Smoker    Packs/day: 0.50    Years: 25.00    Pack years: 12.50    Types: Cigarettes    Start date: 07/03/1964    Quit date: 12/18/1982    Years since quitting: 37.1  . Smokeless tobacco: Never Used  Vaping Use  . Vaping Use: Never used  Substance and Sexual Activity  . Alcohol use: No    Alcohol/week: 0.0 standard drinks  . Drug use: No  . Sexual activity: Yes    Birth control/protection: Post-menopausal  Other Topics Concern  . Not on file  Social History Narrative   Patient lives alone in  [place].    Patient has one daughter, she is very close with her.    Patient is a widow ~3 years.   Patient is still working as a Chartered certified accountant.    Patient enjoys reading and watching TV.   Social Determinants of Health   Financial Resource Strain: Low Risk   . Difficulty of Paying Living Expenses: Not very hard  Food Insecurity: No Food Insecurity  . Worried About Charity fundraiser in the Last Year: Never true  . Ran Out of Food in the Last Year: Never true  Transportation Needs: No Transportation Needs  . Lack of Transportation (Medical): No  . Lack of Transportation (Non-Medical): No  Physical Activity: Inactive  . Days of Exercise per Week: 0 days  . Minutes of Exercise per Session: 0 min  Stress: No Stress Concern Present  . Feeling of  Stress : Only a little  Social Connections: Moderately Integrated  . Frequency of Communication with Friends and Family: More than three times a week  . Frequency of Social Gatherings with Friends and Family: More than three times a week  . Attends Religious Services: More than 4 times per year  . Active Member of Clubs or Organizations: Yes  . Attends Archivist Meetings: More than 4 times per year  . Marital Status: Widowed   Outpatient Encounter Medications as of 01/21/2020  Medication Sig  . acetaminophen (TYLENOL) 500 MG tablet Take 1,000 mg by mouth every 6 (six) hours as needed for mild pain. Reported on 08/19/2015  . amLODipine (NORVASC) 5 MG tablet TAKE 1 TABLET(5 MG) BY MOUTH DAILY  . atorvastatin (LIPITOR) 40 MG tablet TAKE 1 TABLET(40 MG) BY MOUTH DAILY  . carvedilol (COREG) 25 MG tablet Take 1 tablet (25 mg total) by mouth 2 (two) times daily with a meal.  . lisinopril-hydrochlorothiazide (ZESTORETIC) 20-12.5 MG tablet Take 1 tablet by mouth daily.  . metFORMIN (GLUCOPHAGE) 1000 MG tablet TAKE 1 TABLET(1000 MG) BY MOUTH TWICE DAILY WITH A MEAL  . aspirin 81 MG tablet Take 81 mg by mouth daily. (Patient not taking: Reported on 01/21/2020)  . Aspirin-Caffeine (BC FAST PAIN RELIEF ARTHRITIS PO) Take 1 Package by mouth as needed (pain). (Patient not taking: Reported on 01/21/2020)  . baclofen (LIORESAL) 10 MG tablet Take 1 tablet (10 mg total) by mouth 3 (three) times daily as needed for muscle spasms. Start with 5mg  tablets, only increase to 10mg  if needed (Patient not taking: Reported on 01/21/2020)  . Calcium-Magnesium-Vitamin D (CALCIUM 500 PO) Take by mouth. (Patient not taking: Reported on 01/21/2020)  . diclofenac sodium (VOLTAREN) 1 % GEL Apply 2 g topically 4 (four) times daily. (Patient not taking: Reported on 08/11/2019)  . lidocaine (XYLOCAINE) 4 % external solution Apply topically 3 (three) times daily as needed. (Patient not taking: Reported on 08/11/2019)  .  methocarbamol (ROBAXIN) 500 MG tablet Take 1 tablet (500 mg total) by mouth every 8 (eight) hours as needed for muscle spasms. (Patient not taking: Reported on 01/21/2020)  . naproxen (NAPROSYN) 375 MG tablet Take 1 tablet (375 mg total) by mouth 2 (two) times daily. (Patient not taking: Reported on 08/11/2019)  . predniSONE (DELTASONE) 10 MG tablet Take 3 tablets (30 mg total) by mouth daily with breakfast. Take until 2-3 days after symptoms resolve. (Patient not taking: Reported on 08/11/2019)   No facility-administered encounter medications on file as of 01/21/2020.   Activities of Daily Living In your present state of health, do you have  any difficulty performing the following activities: 01/21/2020  Hearing? N  Vision? N  Difficulty concentrating or making decisions? N  Walking or climbing stairs? N  Dressing or bathing? N  Doing errands, shopping? N  Preparing Food and eating ? N  Using the Toilet? N  In the past six months, have you accidently leaked urine? N  Do you have problems with loss of bowel control? N  Managing your Medications? N  Managing your Finances? N  Housekeeping or managing your Housekeeping? N  Some recent data might be hidden   Patient Care Team: Donney Dice, DO as PCP - General (Family Medicine)    Assessment:   This is a routine wellness examination for Rylei.  Exercise Activities and Dietary recommendations Current Exercise Habits: The patient has a physically strenuous job, but has no regular exercise apart from work.  Goals    . Exercise 3x per week (30 min per time)     Start walking in early mornings or evenings when it is cooler.        Fall Risk Fall Risk  01/21/2020 07/29/2019 06/23/2019 04/07/2019 03/04/2019  Falls in the past year? 0 0 0 0 0  Number falls in past yr: - 0 - - -   Is the patient's home free of loose throw rugs in walkways, pet beds, electrical cords, etc?   yes      Grab bars in the bathroom? no      Handrails on the stairs?    yes      Adequate lighting?   yes  Patient rating of health (0-10) scale: 8  Depression Screen PHQ 2/9 Scores 01/21/2020 07/29/2019 06/23/2019 04/07/2019  PHQ - 2 Score 0 0 0 0  PHQ- 9 Score - - - -    Cognitive Function  6CIT Screen 01/21/2020  What Year? 0 points  What month? 0 points  What time? 0 points  Count back from 20 0 points  Months in reverse 0 points  Repeat phrase 0 points  Total Score 0   Immunization History  Administered Date(s) Administered  . Influenza,inj,Quad PF,6+ Mos 03/08/2018, 03/04/2019  . Moderna SARS-COVID-2 Vaccination 07/09/2019, 08/06/2019  . Pneumococcal Conjugate-13 03/04/2019  . Pneumococcal Polysaccharide-23 03/03/2014, 10/05/2017  . Tdap 03/20/2018   Screening Tests Health Maintenance  Topic Date Due  . OPHTHALMOLOGY EXAM  01/01/2016  . HEMOGLOBIN A1C  09/01/2019  . INFLUENZA VACCINE  02/01/2020  . FOOT EXAM  03/03/2020  . MAMMOGRAM  05/10/2020  . COLONOSCOPY  01/02/2021  . TETANUS/TDAP  03/20/2028  . DEXA SCAN  Completed  . COVID-19 Vaccine  Completed  . Hepatitis C Screening  Completed  . PNA vac Low Risk Adult  Completed   Cancer Screenings: Lung: Low Dose CT Chest recommended if Age 54-80 years, 30 pack-year currently smoking OR have quit w/in 15years. Patient does not qualify. Breast:  Up to date on Mammogram? Yes   Up to date of Bone Density/Dexa? Yes Colorectal: UTD  Additional Screenings: Hepatitis C Screening: Completed  HIV Screening: Completed   Plan:  I have updated your chart to reflect covid vaccine completion.  PCP apt scheduled for 02/02/2020 @11am . We have advance directive packets here when you are interested in filling one out.  Start walking 3x per week, this will help with your a1c.   I have personally reviewed and noted the following in the patient's chart:   . Medical and social history . Use of alcohol, tobacco or illicit drugs  .  Current medications and supplements . Functional ability and  status . Nutritional status . Physical activity . Advanced directives . List of other physicians . Hospitalizations, surgeries, and ER visits in previous 12 months . Vitals . Screenings to include cognitive, depression, and falls . Referrals and appointments  In addition, I have reviewed and discussed with patient certain preventive protocols, quality metrics, and best practice recommendations. A written personalized care plan for preventive services as well as general preventive health recommendations were provided to patient.  Dorna Bloom, [place]  01/21/2020

## 2020-01-21 NOTE — Patient Instructions (Addendum)
You spoke to Dorna Bloom, [place] for your annual wellness visit.  We discussed goals: Goals    . Exercise 3x per week (30 min per time)     Start walking in early mornings or evenings when it is cooler.        We also discussed recommended health maintenance. As discussed, you are up to date with pretty much everything. You can get your a1c at scheduled PCP on 02/02/2020.  Health Maintenance  Topic Date Due  . OPHTHALMOLOGY EXAM  01/01/2016  . HEMOGLOBIN A1C  09/01/2019  . INFLUENZA VACCINE  02/01/2020  . FOOT EXAM  03/03/2020  . MAMMOGRAM  05/10/2020  . COLONOSCOPY  01/02/2021  . TETANUS/TDAP  03/20/2028  . DEXA SCAN  Completed  . COVID-19 Vaccine  Completed  . Hepatitis C Screening  Completed  . PNA vac Low Risk Adult  Completed   I have updated your chart to reflect covid vaccine completion.  PCP apt scheduled for 02/02/2020 '@11am' . We have advance directive packets here when you are interested in filling one out.  Start walking 3x per week, this will help with your a1c.   Preventive Care 95 Years and Older, Female Preventive care refers to lifestyle choices and visits with your health care provider that can promote health and wellness. This includes:  A yearly physical exam. This is also called an annual well check.  Regular dental and eye exams.  Immunizations.  Screening for certain conditions.  Healthy lifestyle choices, such as diet and exercise. What can I expect for my preventive care visit? Physical exam Your health care provider will check:  Height and weight. These may be used to calculate body mass index (BMI), which is a measurement that tells if you are at a healthy weight.  Heart rate and blood pressure.  Your skin for abnormal spots. Counseling Your health care provider may ask you questions about:  Alcohol, tobacco, and drug use.  Emotional well-being.  Home and relationship well-being.  Sexual activity.  Eating habits.  History of  falls.  Memory and ability to understand (cognition).  Work and work Statistician.  Pregnancy and menstrual history. What immunizations do I need?  Influenza (flu) vaccine  This is recommended every year. Tetanus, diphtheria, and pertussis (Tdap) vaccine  You may need a Td booster every 10 years. Varicella (chickenpox) vaccine  You may need this vaccine if you have not already been vaccinated. Zoster (shingles) vaccine  You may need this after age 32. Pneumococcal conjugate (PCV13) vaccine  One dose is recommended after age 64. Pneumococcal polysaccharide (PPSV23) vaccine  One dose is recommended after age 33. Measles, mumps, and rubella (MMR) vaccine  You may need at least one dose of MMR if you were born in 1957 or later. You may also need a second dose. Meningococcal conjugate (MenACWY) vaccine  You may need this if you have certain conditions. Hepatitis A vaccine  You may need this if you have certain conditions or if you travel or work in places where you may be exposed to hepatitis A. Hepatitis B vaccine  You may need this if you have certain conditions or if you travel or work in places where you may be exposed to hepatitis B. Haemophilus influenzae type b (Hib) vaccine  You may need this if you have certain conditions. You may receive vaccines as individual doses or as more than one vaccine together in one shot (combination vaccines). Talk with your health care provider about the risks and  benefits of combination vaccines. What tests do I need? Blood tests  Lipid and cholesterol levels. These may be checked every 5 years, or more frequently depending on your overall health.  Hepatitis C test.  Hepatitis B test. Screening  Lung cancer screening. You may have this screening every year starting at age 63 if you have a 30-pack-year history of smoking and currently smoke or have quit within the past 15 years.  Colorectal cancer screening. All adults should  have this screening starting at age 72 and continuing until age 24. Your health care provider may recommend screening at age 65 if you are at increased risk. You will have tests every 1-10 years, depending on your results and the type of screening test.  Diabetes screening. This is done by checking your blood sugar (glucose) after you have not eaten for a while (fasting). You may have this done every 1-3 years.  Mammogram. This may be done every 1-2 years. Talk with your health care provider about how often you should have regular mammograms.  BRCA-related cancer screening. This may be done if you have a family history of breast, ovarian, tubal, or peritoneal cancers. Other tests  Sexually transmitted disease (STD) testing.  Bone density scan. This is done to screen for osteoporosis. You may have this done starting at age 58. Follow these instructions at home: Eating and drinking  Eat a diet that includes fresh fruits and vegetables, whole grains, lean protein, and low-fat dairy products. Limit your intake of foods with high amounts of sugar, saturated fats, and salt.  Take vitamin and mineral supplements as recommended by your health care provider.  Do not drink alcohol if your health care provider tells you not to drink.  If you drink alcohol: ? Limit how much you have to 0-1 drink a day. ? Be aware of how much alcohol is in your drink. In the [place] one drink equals one 12 oz bottle of beer (355 mL), one 5 oz glass of wine (148 mL), or one 1 oz glass of hard liquor (44 mL). Lifestyle  Take daily care of your teeth and gums.  Stay active. Exercise for at least 30 minutes on 5 or more days each week.  Do not use any products that contain nicotine or tobacco, such as cigarettes, e-cigarettes, and chewing tobacco. If you need help quitting, ask your health care provider.  If you are sexually active, practice safe sex. Use a condom or other form of protection in order to prevent STIs  (sexually transmitted infections).  Talk with your health care provider about taking a low-dose aspirin or statin. What's next?  Go to your health care provider once a year for a well check visit.  Ask your health care provider how often you should have your eyes and teeth checked.  Stay up to date on all vaccines. This information is not intended to replace advice given to you by your health care provider. Make sure you discuss any questions you have with your health care provider. Document Revised: 06/13/2018 Document Reviewed: 06/13/2018 Elsevier Patient Education  2020 [facility]'s number is [phone]. Please call with questions or concerns about what we discussed today.

## 2020-02-02 ENCOUNTER — Ambulatory Visit (INDEPENDENT_AMBULATORY_CARE_PROVIDER_SITE_OTHER): Payer: PPO | Admitting: Family Medicine

## 2020-02-02 ENCOUNTER — Other Ambulatory Visit: Payer: Self-pay

## 2020-02-02 VITALS — BP 138/65 | HR 66 | Ht <= 58 in | Wt 165.2 lb

## 2020-02-02 DIAGNOSIS — E785 Hyperlipidemia, unspecified: Secondary | ICD-10-CM

## 2020-02-02 DIAGNOSIS — R1030 Lower abdominal pain, unspecified: Secondary | ICD-10-CM | POA: Diagnosis not present

## 2020-02-02 DIAGNOSIS — H6123 Impacted cerumen, bilateral: Secondary | ICD-10-CM

## 2020-02-02 DIAGNOSIS — I10 Essential (primary) hypertension: Secondary | ICD-10-CM

## 2020-02-02 DIAGNOSIS — E11618 Type 2 diabetes mellitus with other diabetic arthropathy: Secondary | ICD-10-CM

## 2020-02-02 LAB — POCT GLYCOSYLATED HEMOGLOBIN (HGB A1C): HbA1c, POC (controlled diabetic range): 7.6 % — AB (ref 0.0–7.0)

## 2020-02-02 NOTE — Patient Instructions (Signed)
It was so great meeting you today!  Please try to keep a food diary of all the foods you eat and approximate times. Also, please not the times when you are having abdominal pain. Bring this to your next appointment so we can try to determine specific patterns in what may be causing your pain. Try to incorporate fruits and vegetables into your daily diet.   Continue to take the metformin, we will recheck A1c in about 3 months but at this time will not make any changes. As we discussed, a healthy diet helps overall health in general so please try to stick to a healthy diet daily.  Thank you for allowing me to be a part of your medical care, take care and please contact us with any questions or concerns!

## 2020-02-02 NOTE — Progress Notes (Signed)
    SUBJECTIVE:   CHIEF COMPLAINT / HPI:  Abdominal pain Patient presents to the clinic with several month history of abdominal pain below the umbilicus that she describes as a dull, aching pain. Reports that it seems to be associated with certain fried foods and only occurs intermittently. Pain improves with bowel movements. Endorses regular BMs with some loose stools which she occasionally has at night. Denies any constipation, states that if it occurs, it is very rare. Denies associated nausea and vomiting.    PERTINENT  PMH / PSH:  Type 2 DM Patient compliant on metformin. Last A1c 7.1 was 03/2019. Denies any hypoglycemic episodes but does not regularly monitor her blood sugars. Denies any dizziness or weakness. Sees Dr. Katy Fitch, states that she had an eye exam sometime within the past year and everything was fine. Diabetic foot exam up to date. Reports that she has been trying to eliminate sugar, especially candy from her diet. 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  Cerumen Impaction Patient wears hearing aids, bilaterally. Complains of having issues with hearing due to wax accumulation. Last recalls receiving flushing last year.   Hypertension  Compliant on triple therapy, denies monitoring blood pressures at home. Denies chest pain and dyspnea. Has not further complaints at this time. BP in the office today 138/65.  Hyperlipidemia Compliant on statin therapy without any complications. Denies any myalgias. Last lipid panel from 2 years ago demonstrated no abnormalities. States that she eats some fried food but tries to eat healthy.   OBJECTIVE:   BP 138/65   Pulse 66   Ht 4\' 9"  (1.448 m)   Wt 165 lb 3.2 oz (74.9 kg)   SpO2 100%   BMI 35.75 kg/m   General: Patient in no acute distress, well-appearing. HEENT: no JVD, cerumen present in both ears bilaterally with right more significant Cardio: regular rate and rhythm, no murmurs or gallops  appreciated Resp: lungs clear to auscultation bilaterally, no rhonchi or rales noted Abdomen: tender on palpation of lower abdomen, nondistended, active bowel sounds present  Derm: skin warm and dry to touch  ASSESSMENT/PLAN:   Abdominal pain -Encouraged patient to keep a food diary to track what foods may be causing the pain. Also so that we can determine the presence of any patterns, if any. Instructed patient to bring diary at follow up. -Incorporate a healthy diet, try to avoid fried foods -Develop having regular BMs to alleviate associated pain  Diabetes mellitus, type 2 ([place]) -Annual diabetic retinopathy eye exams, next due 03/2020 -A1c 7.6 today, will recheck in 3 months and reassess as needed -continue metformin -implement healthy diet and physical activity   Cerumen impaction -Received flushing treatment today   HTN (hypertension), benign -Continue amlodipine, carvedilol and lisinopril-HCTZ therapy. -Encouraged to regularly monitor blood pressures at home -Pending BMP today to monitor renal function  HLD (hyperlipidemia) -Continue on atorvastatin.  -Implement healthy diet, maintain food diary and engage in adequate exercise daily. -pending results from lipid panel today (02/02/2020)     Donney Dice, [place]

## 2020-02-02 NOTE — Assessment & Plan Note (Signed)
-  Received flushing treatment today

## 2020-02-02 NOTE — Assessment & Plan Note (Signed)
-  Encouraged patient to keep a food diary to track what foods may be causing the pain. Also so that we can determine the presence of any patterns, if any. Instructed patient to bring diary at follow up. -Incorporate a healthy diet, try to avoid fried foods -Develop having regular BMs to alleviate associated pain

## 2020-02-02 NOTE — Assessment & Plan Note (Signed)
-  Continue on atorvastatin.  -Implement healthy diet, maintain food diary and engage in adequate exercise daily. -pending results from lipid panel today (02/02/2020)

## 2020-02-02 NOTE — Assessment & Plan Note (Signed)
-  Annual diabetic retinopathy eye exams, next due 03/2020 -A1c 7.6 today, will recheck in 3 months and reassess as needed -continue metformin -implement healthy diet and physical activity

## 2020-02-02 NOTE — Assessment & Plan Note (Addendum)
-  Continue amlodipine, carvedilol and lisinopril-HCTZ therapy. -Encouraged to regularly monitor blood pressures at home -Pending BMP today to monitor renal function

## 2020-02-03 LAB — BASIC METABOLIC PANEL
BUN/Creatinine Ratio: 13 (ref 12–28)
BUN: 12 mg/dL (ref 8–27)
CO2: 25 mmol/L (ref 20–29)
Calcium: 9.9 mg/dL (ref 8.7–10.3)
Chloride: 99 mmol/L (ref 96–106)
Creatinine, Ser: 0.92 mg/dL (ref 0.57–1.00)
GFR calc Af Amer: 72 mL/min/{1.73_m2} (ref >59)
GFR calc non Af Amer: 63 mL/min/{1.73_m2} (ref >59)
Glucose: 160 mg/dL — ABNORMAL HIGH (ref 65–99)
Potassium: 3.7 mmol/L (ref 3.5–5.2)
Sodium: 138 mmol/L (ref 134–144)

## 2020-02-03 LAB — LIPID PANEL
Chol/HDL Ratio: 2.2 ratio (ref 0.0–4.4)
Cholesterol, Total: 117 mg/dL (ref 100–199)
HDL: 54 mg/dL (ref >39)
LDL Chol Calc (NIH): 49 mg/dL (ref 0–99)
Triglycerides: 64 mg/dL (ref 0–149)
VLDL Cholesterol Cal: 14 mg/dL (ref 5–40)

## 2020-02-19 ENCOUNTER — Other Ambulatory Visit: Payer: Self-pay

## 2020-02-19 DIAGNOSIS — I10 Essential (primary) hypertension: Secondary | ICD-10-CM

## 2020-02-19 MED ORDER — CARVEDILOL 25 MG PO TABS: 25.0000 mg | ORAL_TABLET | Freq: Two times a day (BID) | ORAL | 3 refills | Status: DC

## 2020-03-02 DIAGNOSIS — Z20828 Contact with and (suspected) exposure to other viral communicable diseases: Secondary | ICD-10-CM | POA: Diagnosis not present

## 2020-03-05 DIAGNOSIS — Z20828 Contact with and (suspected) exposure to other viral communicable diseases: Secondary | ICD-10-CM | POA: Diagnosis not present

## 2020-04-30 DIAGNOSIS — H5213 Myopia, bilateral: Secondary | ICD-10-CM | POA: Diagnosis not present

## 2020-04-30 DIAGNOSIS — H43811 Vitreous degeneration, right eye: Secondary | ICD-10-CM | POA: Diagnosis not present

## 2020-04-30 DIAGNOSIS — H35033 Hypertensive retinopathy, bilateral: Secondary | ICD-10-CM | POA: Diagnosis not present

## 2020-04-30 DIAGNOSIS — E119 Type 2 diabetes mellitus without complications: Secondary | ICD-10-CM | POA: Diagnosis not present

## 2020-04-30 DIAGNOSIS — H26493 Other secondary cataract, bilateral: Secondary | ICD-10-CM | POA: Diagnosis not present

## 2020-04-30 DIAGNOSIS — H40053 Ocular hypertension, bilateral: Secondary | ICD-10-CM | POA: Diagnosis not present

## 2020-04-30 DIAGNOSIS — Z961 Presence of intraocular lens: Secondary | ICD-10-CM | POA: Diagnosis not present

## 2020-04-30 DIAGNOSIS — H04123 Dry eye syndrome of bilateral lacrimal glands: Secondary | ICD-10-CM | POA: Diagnosis not present

## 2020-04-30 LAB — HM DIABETES EYE EXAM

## 2020-05-03 ENCOUNTER — Ambulatory Visit (INDEPENDENT_AMBULATORY_CARE_PROVIDER_SITE_OTHER): Payer: PPO | Admitting: Family Medicine

## 2020-05-03 ENCOUNTER — Encounter: Payer: Self-pay | Admitting: Family Medicine

## 2020-05-03 ENCOUNTER — Other Ambulatory Visit: Payer: Self-pay

## 2020-05-03 VITALS — BP 140/78 | HR 68 | Ht 60.0 in | Wt 164.5 lb

## 2020-05-03 DIAGNOSIS — M109 Gout, unspecified: Secondary | ICD-10-CM

## 2020-05-03 DIAGNOSIS — I10 Essential (primary) hypertension: Secondary | ICD-10-CM

## 2020-05-03 DIAGNOSIS — Z23 Encounter for immunization: Secondary | ICD-10-CM | POA: Diagnosis not present

## 2020-05-03 MED ORDER — AMLODIPINE BESYLATE 5 MG PO TABS: ORAL_TABLET | ORAL | 0 refills | Status: DC

## 2020-05-03 MED ORDER — COLCHICINE 0.6 MG PO TABS: 0.6000 mg | ORAL_TABLET | Freq: Every day | ORAL | 0 refills | Status: DC

## 2020-05-03 NOTE — Patient Instructions (Signed)
It was nice to meet you today,  For your gout flare, I have prescribed a medication called colchicine.  You can take this once a day for the next few weeks until your symptoms improve to the point where you no longer need it.  I would like to see you back in the office in 2 weeks.  At that point we can discuss a long-term medication changes And get some lab work to evaluate your gout.  Have a great day,  Clemetine Marker, MD

## 2020-05-03 NOTE — Progress Notes (Signed)
° ° °  SUBJECTIVE:   CHIEF COMPLAINT / HPI:   Gout: Two to three weeks of severe pain and swelling on her right great toe.   still red, tender, and swollen but improved. Now able to bear weight on it Doesn't take any preventative medications for it.  She was taking tylenol but nothing else for it.  Didn't take ibuprofen or aspirin.  She has a hx of arthritis and has had gout flares in the past but none nearly as bad as this one.  Pt Doesn't drink alcohol. She takes lisinopril-hctz pill for several years now.   Pt also needs refill on norvasc.    PERTINENT  PMH / PSH: htn  OBJECTIVE:   BP 140/78    Pulse 68    Ht 5' (1.524 m)    Wt 164 lb 8 oz (74.6 kg)    SpO2 100%    BMI 32.13 kg/m   Gen: alert. Oriented.  No acute distress.  Extremities: right first MTP joint is swollen, warm, tender, and red.  Decreased active ROM in that joint d/t pain.  ASSESSMENT/PLAN:   Gout Symptoms Appears to be improving.  She is not currently on preventative medications such as allopurinol.  Does not take colchicine for flares.  Has been on other NSAIDs in the past but was recently told to cut back by her pcp.  Will prescribe the pt colchicne to take daily until symptoms resolve.  Will f/u in a few weeks and get uric acid level.  In the future need to consider taking off her HCTZ for bp.  Discussed dietary changes to help with gout.   HTN (hypertension), benign Refilled norvasc today.  Consider stopping thiazides in the future if gout continues to be an issue.      Benay Pike, MD [address]

## 2020-05-04 ENCOUNTER — Other Ambulatory Visit: Payer: Self-pay

## 2020-05-04 NOTE — Assessment & Plan Note (Signed)
Refilled norvasc today.  Consider stopping thiazides in the future if gout continues to be an issue.

## 2020-05-04 NOTE — Assessment & Plan Note (Signed)
Symptoms Appears to be improving.  She is not currently on preventative medications such as allopurinol.  Does not take colchicine for flares.  Has been on other NSAIDs in the past but was recently told to cut back by her pcp.  Will prescribe the pt colchicne to take daily until symptoms resolve.  Will f/u in a few weeks and get uric acid level.  In the future need to consider taking off her HCTZ for bp.  Discussed dietary changes to help with gout.

## 2020-05-05 ENCOUNTER — Other Ambulatory Visit: Payer: Self-pay | Admitting: Family Medicine

## 2020-05-05 ENCOUNTER — Telehealth: Payer: Self-pay | Admitting: Family Medicine

## 2020-05-05 MED ORDER — NAPROXEN 500 MG PO TBEC: 500.0000 mg | DELAYED_RELEASE_TABLET | Freq: Two times a day (BID) | ORAL | 0 refills | Status: DC

## 2020-05-05 NOTE — Addendum Note (Signed)
Addended by: Christen Bame D on: 05/05/2020 03:21 PM   Modules accepted: Orders

## 2020-05-05 NOTE — Telephone Encounter (Signed)
Pharmacy calls to report carvedilol can cause and increase in colchicine and they will not be able to fill.  Spoke with Dr. Jeannine Kitten and he will cancel colchicine and send in naproxen.    Informed Pharmacy.  LMOVM of pt to call back. Christen Bame, CMA

## 2020-05-05 NOTE — Progress Notes (Signed)
Switching colchicine to naproxen due to patient's use of carvedilol.

## 2020-05-21 ENCOUNTER — Encounter: Payer: Self-pay | Admitting: Family Medicine

## 2020-05-21 ENCOUNTER — Other Ambulatory Visit: Payer: Self-pay

## 2020-05-21 ENCOUNTER — Ambulatory Visit (INDEPENDENT_AMBULATORY_CARE_PROVIDER_SITE_OTHER): Payer: PPO | Admitting: Family Medicine

## 2020-05-21 ENCOUNTER — Other Ambulatory Visit: Payer: PPO

## 2020-05-21 VITALS — BP 122/70 | HR 64 | Ht 60.0 in | Wt 163.0 lb

## 2020-05-21 DIAGNOSIS — I10 Essential (primary) hypertension: Secondary | ICD-10-CM | POA: Diagnosis not present

## 2020-05-21 DIAGNOSIS — M109 Gout, unspecified: Secondary | ICD-10-CM

## 2020-05-21 DIAGNOSIS — E11618 Type 2 diabetes mellitus with other diabetic arthropathy: Secondary | ICD-10-CM

## 2020-05-21 LAB — POCT GLYCOSYLATED HEMOGLOBIN (HGB A1C): Hemoglobin A1C: 6.9 % — AB (ref 4.0–5.6)

## 2020-05-21 NOTE — Assessment & Plan Note (Signed)
-  A1c 6.9 today, discussed results with patient -Encouraged patient to have regular meals throughout the day and take metformin as prescribed, discussed potential option of transitioning to metformin XR to allow for once administration, states that she will think about it and consider switching possibly at 1 month follow up. Does not endorse any barriers at this time. -continue metformin, encouraged to take as prescribed.  -Diet and exercise counseling, encouraged to continue healthy diet

## 2020-05-21 NOTE — Assessment & Plan Note (Signed)
-  pending uric acid level, will notify patient of abnormal results -likely resolved as symptoms have improved, patient presents asymptomatic, continue to monitor  -discussed diet modifications related to gout, including avoiding red meats and alcohol in particular  -avoid medications that increase uric acid levels

## 2020-05-21 NOTE — Progress Notes (Signed)
    SUBJECTIVE:   CHIEF COMPLAINT / HPI:   Gout Patient presented about 18 days ago with a gout flare. She previously had flare-ups before but this most recent one is reported to be the worst episode she has experienced. Instructed to take naproxen since she was told colchicine had an adverse interaction with one of her existing medications. States that she is doing significantly better today, reports symptoms improving with taking naproxen for less than 2 weeks. Denies any additional concerns at this time.   PERTINENT  PMH / PSH:   Hypertension Denies chest pain and dyspnea. Compliant on amlodipine, carvedilol and lisinopril-HCTZ. Occasional checks blood pressures, reports that all have been within normotensive range.  Type 2 DM Prescribed metformin 1000 mg bid with meals but occasionally only takes it once daily. States that this is usually because she will not have another meal. Denies dizziness. Occasionally checks blood glucose levels which usually range around 150s. Denies any hypoglycemic episodes. Last A1c 7.6. Reports eating plenty of vegetables and maintaining hydration.   OBJECTIVE:   BP 122/70   Pulse 64   Ht 5' (1.524 m)   Wt 163 lb (73.9 kg)   SpO2 99%   BMI 31.83 kg/m   General: Patient well-appearing, in no acute distress. HEENT: normocephalic, supple neck without evidence of lymphadenopathy CV: RRR, no murmurs or gallops auscultated Resp: lungs clear to auscultation bilaterally, no rales or rhonchi noted  Abdomen: soft, nontender, presence of active bowel sounds Ext: no LE edema noted bilaterally, radial and distal pulses strong and equal bilaterally  MSK: no evidence of rash, erythema and warm to touch of both right and left great toe, normal active ROM of great toe bilaterally   Neuro: normal gait without limitations or assistance  Psych: mood appropriate, pleasant  ASSESSMENT/PLAN:   Gout -pending uric acid level, will notify patient of abnormal  results -likely resolved as symptoms have improved, patient presents asymptomatic, continue to monitor  -discussed diet modifications related to gout, including avoiding red meats and alcohol in particular  -avoid medications that increase uric acid levels   HTN (hypertension), benign -BP 122/70 this visit -Given normotensive BP in the office and multiple reported from home, discontinued lisinopril-HCTZ to prevent further gout flares.  -Instructed to only take amlodipine and carvedilol for blood pressure control. -encouraged to continue healthy lifestyle modifications, diet and exercise counseling  -Follow up in 1 month to recheck blood pressure, consider losartan if patient needing an additional anti-hypertensive agent  Diabetes mellitus, type 2 (HCC) -A1c 6.9 today, discussed results with patient -Encouraged patient to have regular meals throughout the day and take metformin as prescribed, discussed potential option of transitioning to metformin XR to allow for once administration, states that she will think about it and consider switching possibly at 1 month follow up. Does not endorse any barriers at this time. -continue metformin, encouraged to take as prescribed.  -Diet and exercise counseling, encouraged to continue healthy diet    Med rec and PHQ-9 score of 0 reviewed.   Donney Dice, [place]

## 2020-05-21 NOTE — Patient Instructions (Addendum)
It was so great seeing you today! I am glad you are feeling better!  Today we discontinued your lisinopril-hydrochlorothiazide medication for blood pressure. Try to record your blood pressure at least every few days to a week and bring this to your next office visit. Please continue to eat healthy and take metformin two times daily with meals. We will check a uric acid level today, I will contact you regarding any abnormal results.   Please follow up in 1 month to recheck blood pressure, this will be to determine if we need to start you on another medication to control your blood pressure.   Thank you for allowing [place] to be a part of your medical care!  Thank you, Dr. Larae Grooms

## 2020-05-21 NOTE — Assessment & Plan Note (Signed)
-  BP 122/70 this visit -Given normotensive BP in the office and multiple reported from home, discontinued lisinopril-HCTZ to prevent further gout flares.  -Instructed to only take amlodipine and carvedilol for blood pressure control. -encouraged to continue healthy lifestyle modifications, diet and exercise counseling  -Follow up in 1 month to recheck blood pressure, consider losartan if patient needing an additional anti-hypertensive agent

## 2020-05-22 LAB — URIC ACID: Uric Acid: 6.7 mg/dL (ref 3.1–7.9)

## 2020-05-24 NOTE — Addendum Note (Signed)
Addended by: Donney Dice on: 05/24/2020 12:21 PM   Modules accepted: Level of Service

## 2020-06-14 ENCOUNTER — Other Ambulatory Visit: Payer: Self-pay | Admitting: Family Medicine

## 2020-06-14 DIAGNOSIS — E785 Hyperlipidemia, unspecified: Secondary | ICD-10-CM

## 2020-07-29 ENCOUNTER — Other Ambulatory Visit: Payer: Self-pay | Admitting: Family Medicine

## 2020-07-29 DIAGNOSIS — I10 Essential (primary) hypertension: Secondary | ICD-10-CM

## 2020-08-02 DIAGNOSIS — Z20828 Contact with and (suspected) exposure to other viral communicable diseases: Secondary | ICD-10-CM | POA: Diagnosis not present

## 2020-08-05 DIAGNOSIS — Z20828 Contact with and (suspected) exposure to other viral communicable diseases: Secondary | ICD-10-CM | POA: Diagnosis not present

## 2020-08-09 DIAGNOSIS — Z20828 Contact with and (suspected) exposure to other viral communicable diseases: Secondary | ICD-10-CM | POA: Diagnosis not present

## 2020-08-12 DIAGNOSIS — Z20828 Contact with and (suspected) exposure to other viral communicable diseases: Secondary | ICD-10-CM | POA: Diagnosis not present

## 2020-08-16 DIAGNOSIS — Z20828 Contact with and (suspected) exposure to other viral communicable diseases: Secondary | ICD-10-CM | POA: Diagnosis not present

## 2020-08-19 DIAGNOSIS — Z20828 Contact with and (suspected) exposure to other viral communicable diseases: Secondary | ICD-10-CM | POA: Diagnosis not present

## 2020-08-23 DIAGNOSIS — Z20828 Contact with and (suspected) exposure to other viral communicable diseases: Secondary | ICD-10-CM | POA: Diagnosis not present

## 2020-08-26 DIAGNOSIS — Z20828 Contact with and (suspected) exposure to other viral communicable diseases: Secondary | ICD-10-CM | POA: Diagnosis not present

## 2020-08-30 DIAGNOSIS — Z20828 Contact with and (suspected) exposure to other viral communicable diseases: Secondary | ICD-10-CM | POA: Diagnosis not present

## 2020-09-02 DIAGNOSIS — Z20828 Contact with and (suspected) exposure to other viral communicable diseases: Secondary | ICD-10-CM | POA: Diagnosis not present

## 2020-09-04 ENCOUNTER — Emergency Department (HOSPITAL_COMMUNITY): Payer: PPO

## 2020-09-04 ENCOUNTER — Emergency Department (HOSPITAL_COMMUNITY)
Admission: EM | Admit: 2020-09-04 | Discharge: 2020-09-04 | Disposition: A | Discharge status: Home / Self Care | Payer: PPO | Attending: Emergency Medicine | Admitting: Emergency Medicine

## 2020-09-04 ENCOUNTER — Encounter (HOSPITAL_COMMUNITY): Payer: Self-pay

## 2020-09-04 ENCOUNTER — Other Ambulatory Visit: Payer: Self-pay

## 2020-09-04 DIAGNOSIS — N1330 Unspecified hydronephrosis: Secondary | ICD-10-CM | POA: Diagnosis not present

## 2020-09-04 DIAGNOSIS — R399 Unspecified symptoms and signs involving the genitourinary system: Secondary | ICD-10-CM

## 2020-09-04 DIAGNOSIS — Z7984 Long term (current) use of oral hypoglycemic drugs: Secondary | ICD-10-CM | POA: Insufficient documentation

## 2020-09-04 DIAGNOSIS — R1084 Generalized abdominal pain: Secondary | ICD-10-CM | POA: Diagnosis not present

## 2020-09-04 DIAGNOSIS — R93429 Abnormal radiologic findings on diagnostic imaging of unspecified kidney: Secondary | ICD-10-CM | POA: Diagnosis not present

## 2020-09-04 DIAGNOSIS — I10 Essential (primary) hypertension: Secondary | ICD-10-CM | POA: Diagnosis not present

## 2020-09-04 DIAGNOSIS — Z87891 Personal history of nicotine dependence: Secondary | ICD-10-CM | POA: Insufficient documentation

## 2020-09-04 DIAGNOSIS — K5792 Diverticulitis of intestine, part unspecified, without perforation or abscess without bleeding: Secondary | ICD-10-CM | POA: Diagnosis not present

## 2020-09-04 DIAGNOSIS — K5732 Diverticulitis of large intestine without perforation or abscess without bleeding: Secondary | ICD-10-CM | POA: Diagnosis not present

## 2020-09-04 DIAGNOSIS — E119 Type 2 diabetes mellitus without complications: Secondary | ICD-10-CM | POA: Insufficient documentation

## 2020-09-04 DIAGNOSIS — K449 Diaphragmatic hernia without obstruction or gangrene: Secondary | ICD-10-CM | POA: Diagnosis not present

## 2020-09-04 DIAGNOSIS — R944 Abnormal results of kidney function studies: Secondary | ICD-10-CM | POA: Diagnosis not present

## 2020-09-04 DIAGNOSIS — I2510 Atherosclerotic heart disease of native coronary artery without angina pectoris: Secondary | ICD-10-CM | POA: Diagnosis not present

## 2020-09-04 DIAGNOSIS — Z79899 Other long term (current) drug therapy: Secondary | ICD-10-CM | POA: Diagnosis not present

## 2020-09-04 DIAGNOSIS — M47814 Spondylosis without myelopathy or radiculopathy, thoracic region: Secondary | ICD-10-CM | POA: Diagnosis not present

## 2020-09-04 DIAGNOSIS — Z853 Personal history of malignant neoplasm of breast: Secondary | ICD-10-CM | POA: Insufficient documentation

## 2020-09-04 DIAGNOSIS — N281 Cyst of kidney, acquired: Secondary | ICD-10-CM | POA: Diagnosis not present

## 2020-09-04 LAB — CBC
HCT: 37.6 % (ref 36.0–46.0)
Hemoglobin: 12.4 g/dL (ref 12.0–15.0)
MCH: 28.9 pg (ref 26.0–34.0)
MCHC: 33 g/dL (ref 30.0–36.0)
MCV: 87.6 fL (ref 80.0–100.0)
Platelets: 301 10*3/uL (ref 150–400)
RBC: 4.29 MIL/uL (ref 3.87–5.11)
RDW: 13.2 % (ref 11.5–15.5)
WBC: 8.3 10*3/uL (ref 4.0–10.5)
nRBC: 0 % (ref 0.0–0.2)

## 2020-09-04 LAB — COMPREHENSIVE METABOLIC PANEL
ALT: 16 U/L (ref 0–44)
AST: 20 U/L (ref 15–41)
Albumin: 3.6 g/dL (ref 3.5–5.0)
Alkaline Phosphatase: 110 U/L (ref 38–126)
Anion gap: 8 (ref 5–15)
BUN: 8 mg/dL (ref 8–23)
CO2: 25 mmol/L (ref 22–32)
Calcium: 9.2 mg/dL (ref 8.9–10.3)
Chloride: 100 mmol/L (ref 98–111)
Creatinine, Ser: 0.75 mg/dL (ref 0.44–1.00)
GFR, Estimated: >60 mL/min (ref >60)
Glucose, Bld: 148 mg/dL — ABNORMAL HIGH (ref 70–99)
Potassium: 4.1 mmol/L (ref 3.5–5.1)
Sodium: 133 mmol/L — ABNORMAL LOW (ref 135–145)
Total Bilirubin: 1.3 mg/dL — ABNORMAL HIGH (ref 0.3–1.2)
Total Protein: 6.9 g/dL (ref 6.5–8.1)

## 2020-09-04 LAB — URINALYSIS, ROUTINE W REFLEX MICROSCOPIC
Bilirubin Urine: NEGATIVE
Glucose, UA: NEGATIVE mg/dL
Ketones, ur: NEGATIVE mg/dL
Nitrite: NEGATIVE
Protein, ur: 30 mg/dL — AB
Specific Gravity, Urine: 1.024 (ref 1.005–1.030)
pH: 5 (ref 5.0–8.0)

## 2020-09-04 LAB — LACTIC ACID, PLASMA: Lactic Acid, Venous: 1.1 mmol/L (ref 0.5–1.9)

## 2020-09-04 LAB — LIPASE, BLOOD: Lipase: 30 U/L (ref 11–51)

## 2020-09-04 MED ORDER — OXYCODONE-ACETAMINOPHEN 5-325 MG PO TABS
1.0000 | ORAL_TABLET | Freq: Once | ORAL | Status: AC
  Administered 2020-09-04: 1 via ORAL
  Filled 2020-09-04: qty 1

## 2020-09-04 MED ORDER — CIPROFLOXACIN HCL 500 MG PO TABS
500.0000 mg | ORAL_TABLET | Freq: Once | ORAL | Status: AC
  Administered 2020-09-04: 500 mg via ORAL
  Filled 2020-09-04: qty 1

## 2020-09-04 MED ORDER — ONDANSETRON HCL 4 MG PO TABS: 4.0000 mg | ORAL_TABLET | Freq: Three times a day (TID) | ORAL | 0 refills | Status: DC | PRN

## 2020-09-04 MED ORDER — METRONIDAZOLE 500 MG PO TABS: 500.0000 mg | ORAL_TABLET | Freq: Three times a day (TID) | ORAL | 0 refills | Status: AC

## 2020-09-04 MED ORDER — OXYCODONE-ACETAMINOPHEN 5-325 MG PO TABS: 1.0000 | ORAL_TABLET | ORAL | 0 refills | Status: DC | PRN

## 2020-09-04 MED ORDER — MORPHINE SULFATE (PF) 4 MG/ML IV SOLN
4.0000 mg | Freq: Once | INTRAVENOUS | Status: AC
  Administered 2020-09-04: 4 mg via INTRAVENOUS
  Filled 2020-09-04: qty 1

## 2020-09-04 MED ORDER — CIPROFLOXACIN HCL 500 MG PO TABS: 500.0000 mg | ORAL_TABLET | Freq: Two times a day (BID) | ORAL | 0 refills | Status: AC

## 2020-09-04 MED ORDER — METRONIDAZOLE 500 MG PO TABS
500.0000 mg | ORAL_TABLET | Freq: Once | ORAL | Status: AC
  Administered 2020-09-04: 500 mg via ORAL
  Filled 2020-09-04: qty 1

## 2020-09-04 MED ORDER — IOHEXOL 350 MG/ML SOLN
95.0000 mL | Freq: Once | INTRAVENOUS | Status: AC | PRN
  Administered 2020-09-04: 95 mL via INTRAVENOUS

## 2020-09-04 NOTE — ED Provider Notes (Signed)
[facility] EMERGENCY DEPARTMENT Provider Note   CSN: [id number] Arrival date & time: 09/04/20  9211     History Chief Complaint  Patient presents with  . Abdominal Pain    Ashley Sloan is a 70 y.o. female.  The history is provided by the patient and medical records. No language interpreter was used.  Abdominal Pain Pain location:  Generalized Pain quality: aching and sharp   Pain radiates to:  R flank, L flank and back Pain severity:  Severe Onset quality:  Gradual Duration:  1 week Timing:  Constant Progression:  Waxing and waning Chronicity:  New Context: not trauma   Relieved by:  Nothing Worsened by:  Nothing Associated symptoms: diarrhea   Associated symptoms: no chest pain, no chills, no constipation, no cough, no dysuria, no fatigue, no fever, no nausea, no shortness of breath and no vomiting        Past Medical History:  Diagnosis Date  . Cancer ([place]) 1991   Breast-left  . Diabetes mellitus   . Hyperlipidemia   . Hypertension   . Left breast mass     Patient Active Problem List   Diagnosis Date Noted  . Neck and shoulder pain 08/03/2019  . Unintentional weight loss 03/04/2019  . Cerumen impaction 03/04/2019  . Abdominal pain 03/08/2018  . Breast lesion 10/05/2017  . Healthcare maintenance 03/17/2016  . Gout 04/16/2014  . Foot deformity 03/16/2014  . HLD (hyperlipidemia) 03/03/2014  . HTN (hypertension), benign 12/19/2011  . Diabetes mellitus, type 2 ([place]) 12/19/2011    Past Surgical History:  Procedure Laterality Date  . BREAST EXCISIONAL BIOPSY Left    benign  . BREAST LUMPECTOMY Left 1991  . BREAST SURGERY  1991   Lumpectomy  . [place]  . MASS EXCISION Left 11/19/2017   Procedure: EXCISION LEFT BREAST  MASS ERAS PATHWAY;  Surgeon: Excell Seltzer, MD;  Location: [facility];  Service: General;  Laterality: Left;     OB History   No obstetric history on file.     Family History   Problem Relation Age of Onset  . Diabetes Mother   . Hypertension Mother   . COPD Mother   . Hypertension Father   . Heart disease Sister   . Hypertension Brother   . Diabetes Daughter     Social History   Tobacco Use  . Smoking status: Former Smoker    Packs/day: 0.50    Years: 25.00    Pack years: 12.50    Types: Cigarettes    Start date: 07/03/1964    Quit date: 12/18/1982    Years since quitting: 37.7  . Smokeless tobacco: Never Used  Vaping Use  . Vaping Use: Never used  Substance Use Topics  . Alcohol use: No    Alcohol/week: 0.0 standard drinks  . Drug use: No    Home Medications Prior to Admission medications   Medication Sig Start Date End Date Taking? Authorizing Provider  acetaminophen (TYLENOL) 500 MG tablet Take 1,000 mg by mouth every 6 (six) hours as needed for mild pain. Reported on 08/19/2015    [provider]  amLODipine (NORVASC) 5 MG tablet TAKE 1 TABLET BY MOUTH DAILY. 07/29/20   Ganta, Anupa, DO  atorvastatin (LIPITOR) 40 MG tablet TAKE 1 TABLET(40 MG) BY MOUTH DAILY 06/14/20   Ganta, Anupa, DO  carvedilol (COREG) 25 MG tablet Take 1 tablet (25 mg total) by mouth 2 (two) times daily with a meal. 02/19/20  Ganta, Anupa, DO  lidocaine (XYLOCAINE) 4 % external solution Apply topically 3 (three) times daily as needed. Patient not taking: Reported on 08/11/2019 06/27/18   Margette Fast, MD  metFORMIN (GLUCOPHAGE) 1000 MG tablet TAKE 1 TABLET(1000 MG) BY MOUTH TWICE DAILY WITH A MEAL 03/20/19   Caroline More, DO  naproxen (EC NAPROSYN) 500 MG EC tablet Take 1 tablet (500 mg total) by mouth 2 (two) times daily with a meal. For no more than 2 weeks 05/05/20   Benay Pike, MD    Allergies    Patient has no known allergies.  Review of Systems   Review of Systems  Constitutional: Negative for chills, diaphoresis, fatigue and fever.  HENT: Negative for congestion.   Eyes: Negative for visual disturbance.  Respiratory: Negative for cough, chest  tightness, shortness of breath and wheezing.   Cardiovascular: Negative for chest pain, palpitations and leg swelling.  Gastrointestinal: Positive for abdominal pain and diarrhea. Negative for constipation, nausea and vomiting.  Genitourinary: Positive for flank pain and frequency. Negative for dysuria.  Musculoskeletal: Positive for back pain. Negative for neck pain and neck stiffness.  Skin: Negative for rash and wound.  Neurological: Negative for weakness, light-headedness and headaches.  Psychiatric/Behavioral: Negative for agitation.  All other systems reviewed and are negative.   Physical Exam Updated Vital Signs BP (!) 207/90 (BP Location: Left Arm)   Pulse 72   Temp 97.7 F (36.5 C) (Oral)   Resp 18   Ht 5' (1.524 m)   Wt 74.8 kg   SpO2 100%   BMI 32.22 kg/m   Physical Exam Vitals and nursing note reviewed.  Constitutional:      General: She is not in acute distress.    Appearance: She is well-developed and well-nourished. She is not ill-appearing, toxic-appearing or diaphoretic.  HENT:     Head: Normocephalic and atraumatic.     Mouth/Throat:     Mouth: Mucous membranes are moist.  Eyes:     Conjunctiva/sclera: Conjunctivae normal.  Cardiovascular:     Rate and Rhythm: Normal rate and regular rhythm.     Heart sounds: Normal heart sounds. No murmur heard.   Pulmonary:     Effort: Pulmonary effort is normal. No respiratory distress.     Breath sounds: Normal breath sounds. No wheezing, rhonchi or rales.  Chest:     Chest wall: No tenderness.  Abdominal:     General: Abdomen is flat. Bowel sounds are normal. There is no distension.     Palpations: Abdomen is soft.     Tenderness: There is generalized abdominal tenderness. There is no right CVA tenderness, left CVA tenderness, guarding or rebound.  Musculoskeletal:        General: No edema.     Cervical back: Neck supple.  Skin:    General: Skin is warm and dry.     Capillary Refill: Capillary refill takes  less than 2 seconds.  Neurological:     General: No focal deficit present.     Mental Status: She is alert.  Psychiatric:        Mood and Affect: Mood and affect and mood normal.     ED Results / Procedures / Treatments   Labs (all labs ordered are listed, but only abnormal results are displayed) Labs Reviewed  COMPREHENSIVE METABOLIC PANEL - Abnormal; Notable for the following components:      Result Value   Sodium 133 (*)    Glucose, Bld 148 (*)    Total  Bilirubin 1.3 (*)    All other components within normal limits  URINALYSIS, ROUTINE W REFLEX MICROSCOPIC - Abnormal; Notable for the following components:   APPearance HAZY (*)    Hgb urine dipstick LARGE (*)    Protein, ur 30 (*)    Leukocytes,Ua MODERATE (*)    Bacteria, UA RARE (*)    Non Squamous Epithelial 0-5 (*)    All other components within normal limits  URINE CULTURE  LIPASE, BLOOD  CBC  LACTIC ACID, PLASMA    EKG EKG Interpretation  Date/Time:  Saturday September 04 2020 07:42:25 EST Ventricular Rate:  66 PR Interval:    QRS Duration: 116 QT Interval:  440 QTC Calculation: 461 R Axis:   -30 Text Interpretation: Sinus rhythm Atrial premature complex Incomplete RBBB and LAFB Left ventricular hypertrophy When compared to prior, similar appaernec. No STEMI Confirmed by Antony Blackbird [phone]) on 09/04/2020 7:57:20 AM   Radiology CT Angio Chest/Abd/Pel for Dissection W and/or Wo Contrast  Result Date: 09/04/2020 CLINICAL DATA:  Abdominal pain. Aortic dissection suspected. High blood pressure. Also consider pyelonephritis, stones, diverticulitis, cholecystitis, and pancreatitis. EXAM: CT ANGIOGRAPHY CHEST, ABDOMEN AND PELVIS TECHNIQUE: Non-contrast CT of the chest was initially obtained. Multidetector CT imaging through the chest, abdomen and pelvis was performed using the standard protocol during bolus administration of intravenous contrast. Multiplanar reconstructed images and MIPs were obtained and reviewed to  evaluate the vascular anatomy. CONTRAST:  65mL OMNIPAQUE IOHEXOL 350 MG/ML SOLN COMPARISON:  CT scan of the abdomen and pelvis without contrast June 27, 2018 FINDINGS: CTA CHEST FINDINGS Cardiovascular: Calcified atherosclerosis is seen in the thoracic aorta without aneurysm. No thoracic aortic dissection. The heart size is unremarkable. Calcified atherosclerosis is seen in the right coronary artery. There may be minimal calcified atherosclerosis in the proximal left LAD as seen on series 5, image 27. The study was not tailored to evaluate the pulmonary arteries. However, the pulmonary arteries are well opacified and there are no pulmonary emboli identified. Mediastinum/Nodes: No effusions. Surgical clips seen in the left axilla. The chest wall is unremarkable. The thyroid is normal. No adenopathy in the chest. There is a small hiatal hernia. The esophagus is otherwise unremarkable. Lungs/Pleura: The central airways are normal. No pneumothorax. There is a 4 mm nodule in the left base on series 8, image 46. No other pulmonary nodules or masses. No suspicious infiltrates to suggest pneumonia or aspiration. Musculoskeletal: Degenerative changes in the thoracic spine. No other bony abnormalities. Review of the MIP images confirms the above findings. CTA ABDOMEN AND PELVIS FINDINGS VASCULAR Aorta: No aneurysm in the abdominal aorta. No dissection. Atherosclerotic changes are seen. Celiac: Atherosclerotic changes are seen in the origin of the celiac artery without significant stenosis. SMA: Patent without evidence of aneurysm, dissection, vasculitis or significant stenosis. Renals: Both renal arteries are patent without evidence of aneurysm, dissection, vasculitis, fibromuscular dysplasia or significant stenosis. IMA: Patent without evidence of aneurysm, dissection, vasculitis or significant stenosis. Inflow: Patent without evidence of aneurysm, dissection, vasculitis or significant stenosis. Veins: No obvious venous  abnormality within the limitations of this arterial phase study. Review of the MIP images confirms the above findings. NON-VASCULAR Hepatobiliary: No focal liver abnormality is seen. No gallstones, gallbladder wall thickening, or biliary dilatation. Pancreas: Unremarkable. No pancreatic ductal dilatation or surrounding inflammatory changes. Spleen: Normal in size without focal abnormality. Adrenals/Urinary Tract: Adrenal glands are normal. There is a cyst in the lower pole the left kidney. Evaluation for stones is limited due to contrast in the  renal collecting systems. A few small stones are not excluded on the left. No hydronephrosis or perinephric stranding on the left. The left ureter is normal in appearance. There is mild hydronephrosis on the right which is a new finding. No perinephric stranding. There is increased attenuation in the mildly prominent right renal pelvis, extending into the proximal right ureter and a left-sided indibulin as seen on axial image 108. The right ureter is nondilated. The bladder is unremarkable. Stomach/Bowel: There is a small hiatal hernia. The stomach and small bowel are normal. Colonic diverticulosis is identified. There is fat stranding adjacent to the distal descending colon near its junction with the sigmoid colon consistent with diverticulitis. No evidence of perforation. The remainder of the colon is normal. The appendix is normal. Lymphatic: No adenopathy identified. Reproductive: Uterus and bilateral adnexa are unremarkable. Other: No free air. There is fluid in the pelvis, likely secondary to the diverticulitis described above. No abscess. Musculoskeletal: Sclerosis in the right hip is unchanged since 2019, likely a bone island. Degenerative changes seen in the lumbar spine. Review of the MIP images confirms the above findings. IMPRESSION: 1. Increased attenuation in the right renal pelvis extending into the proximal right ureter and a right-sided infundibulum worrisome  for neoplasm/malignancy such as a transitional cell carcinoma. The findings are not completely specific. Recommend urologic consultation. 2. Diverticulitis in the distal descending colon, likely explaining the patient's symptoms. 3. Atherosclerotic change in the nonaneurysmal aorta. No dissection. 4. Free fluid in the pelvis is likely secondary to the diverticulitis. 5. No other acute abnormalities. Electronically Signed   By: Dorise Bullion III M.D   On: 09/04/2020 10:30    Procedures Procedures   Medications Ordered in ED Medications  ciprofloxacin (CIPRO) tablet 500 mg (has no administration in time range)  metroNIDAZOLE (FLAGYL) tablet 500 mg (has no administration in time range)  oxyCODONE-acetaminophen (PERCOCET/ROXICET) 5-325 MG per tablet 1 tablet (has no administration in time range)  morphine 4 MG/ML injection 4 mg (4 mg Intravenous Given 09/04/20 0759)  iohexol (OMNIPAQUE) 350 MG/ML injection 95 mL (95 mLs Intravenous Contrast Given 09/04/20 7096)    ED Course  I have reviewed the triage vital signs and the nursing notes.  Pertinent labs & imaging results that were available during my care of the patient were reviewed by me and considered in my medical decision making (see chart for details).    MDM Rules/Calculators/A&P                          Ashley Sloan is a 70 y.o. female with a past medical history significant for hypertension, hyperlipidemia, diabetes, gout, and prior breast cancer who presents with upper abdominal pain.  She reports that for the last few days, patient has been having severe pain all across her abdomen.  She reports it is worst in the epigastric area but does go to the umbilicus and all over.  She reports she had some increased urinary frequency several days ago and has had some loose stools as well.  She denies any blood in her stools.  She denies nausea, vomiting, or constipation.  She denies any trauma.  She denies any chest pain, palpitations, shortness  of breath.  Denies any syncope.  She reports that her PCP changed her blood pressure medications around a year ago and although she is still on carvedilol and amlodipine, she does say that her blood pressure has been very elevated.  Her blood pressure  on arrival was over 657 systolic.  She denies fevers, chills, congestion, cough, or respiratory symptoms.  She describes abdominal pain as 8 out of 10 in severity and it is sharp.  She does say it wraps around towards her back.  She denies any history of abdominal aortic abnormalities.  On exam, abdomen is diffusely tender.  Bowel sounds were appreciated.  Flanks are also slightly tender but CVA areas were nontender.  Lungs were clear and chest was nontender.  No murmur appreciated.  Good pulses in all extremities.  Patient does report she is had some right foot pain which is chronic and unchanged from baseline.  Intact sensation and strength in extremities.  She denies any neurologic deficits in her legs.  Clinically I do feel need to rule out intra-abdominal pathology including pancreatitis, cholecystitis, diverticulitis, kidney stones, pyelonephritis however given the patient's blood pressure over 200 and reports that this has been ongoing and the pain goes to her back do feel need imaging to rule out aortic pathology.  Patient agrees to get a CT dissection study as well as labs.  We will give her pain medicine.  If the CT is reassuring and she is still having upper abdominal discomfort, will consider adding ultrasound on as well.  Anticipate reassessment after work-up to determine disposition.  CT scan showed evidence of diverticulitis as likely cause of her discomfort.  She did have some abnormal tissue on the right kidney which was somewhat concerning for a cancerous mass.  Urology consult was recommended on imaging.  Urology was called and spoke to Dr. Alinda Money.  He does recommend patient follow-up with him this week and she will be given the number  for their office to call on Monday.  He agreed with the urine culture being added although there were no nitrites in the urine.  Patient be given antibiotics for the diverticulitis as well as pain and nausea medicine.  Patient was able to tolerate p.o. and felt better with pain medication.  Patient agrees with plan of care and follow-up instructions.  She otherwise or concerns and was discharged in good condition.   Final Clinical Impression(s) / ED Diagnoses Final diagnoses:  Diverticulitis  Abnormal finding of kidney  Generalized abdominal pain    Rx / DC Orders ED Discharge Orders         Ordered    oxyCODONE-acetaminophen (PERCOCET/ROXICET) 5-325 MG tablet  Every 4 hours PRN        09/04/20 1502    ondansetron (ZOFRAN) 4 MG tablet  Every 8 hours PRN        09/04/20 1502    ciprofloxacin (CIPRO) 500 MG tablet  Every 12 hours        09/04/20 1502    metroNIDAZOLE (FLAGYL) 500 MG tablet  3 times daily        09/04/20 1502          Clinical Impression: 1. Diverticulitis   2. Abnormal finding of kidney   3. Generalized abdominal pain     Disposition: Discharge  Condition: Good  I have discussed the results, Dx and Tx plan with the pt(& family if present). He/she/they expressed understanding and agree(s) with the plan. Discharge instructions discussed at great length. Strict return precautions discussed and pt &/or family have verbalized understanding of the instructions. No further questions at time of discharge.    New Prescriptions   CIPROFLOXACIN (CIPRO) 500 MG TABLET    Take 1 tablet (500 mg total) by mouth every 12 (  twelve) hours for 10 days.   METRONIDAZOLE (FLAGYL) 500 MG TABLET    Take 1 tablet (500 mg total) by mouth 3 (three) times daily for 10 days.   ONDANSETRON (ZOFRAN) 4 MG TABLET    Take 1 tablet (4 mg total) by mouth every 8 (eight) hours as needed.   OXYCODONE-ACETAMINOPHEN (PERCOCET/ROXICET) 5-325 MG TABLET    Take 1 tablet by mouth every 4 (four) hours  as needed for severe pain.    Follow Up: Raynelle Bring, MD [place] [id number] [phone]     [facility] [address] [id number] [place] [facility] [place] [id number] [phone] Schedule an appointment as soon as possible for a visit       Shalayah Beagley, Gwenyth Allegra, MD 09/04/20 1544

## 2020-09-04 NOTE — ED Notes (Signed)
Transported to xray 

## 2020-09-04 NOTE — Discharge Instructions (Signed)
Your work-up today revealed diverticulitis is the likely cause of your abdominal discomfort.  Please take the antibiotics and symptomatic relief medications with the pain and nausea medicine to help with your symptoms.  The CT imaging today also revealed the abnormal tissue in your kidney.  I spoke with urology who would like you to call them on Monday to discuss outpatient follow-up for further evaluation.  Please rest and stay hydrated.  Please follow-up with your primary doctor for ongoing further management as well as further blood pressure management.  If any symptoms change or worsen acutely, please return to the nearest emergency department.

## 2020-09-04 NOTE — ED Triage Notes (Addendum)
Pt states,  "My stomach has been hurting all week." Pain to left upper quadrant area. Denies nausea, vomiting, fever, or urinary symptoms. Reports having loose stools that started yesterday.

## 2020-09-05 LAB — URINE CULTURE: Culture: <10000 — AB

## 2020-09-06 DIAGNOSIS — Z20828 Contact with and (suspected) exposure to other viral communicable diseases: Secondary | ICD-10-CM | POA: Diagnosis not present

## 2020-09-09 DIAGNOSIS — Z20828 Contact with and (suspected) exposure to other viral communicable diseases: Secondary | ICD-10-CM | POA: Diagnosis not present

## 2020-09-13 DIAGNOSIS — Z20828 Contact with and (suspected) exposure to other viral communicable diseases: Secondary | ICD-10-CM | POA: Diagnosis not present

## 2020-09-14 DIAGNOSIS — C651 Malignant neoplasm of right renal pelvis: Secondary | ICD-10-CM | POA: Diagnosis not present

## 2020-09-15 ENCOUNTER — Other Ambulatory Visit: Payer: Self-pay | Admitting: Urology

## 2020-09-16 DIAGNOSIS — Z20828 Contact with and (suspected) exposure to other viral communicable diseases: Secondary | ICD-10-CM | POA: Diagnosis not present

## 2020-09-27 ENCOUNTER — Encounter (HOSPITAL_COMMUNITY): Admission: RE | Admit: 2020-09-27 | Payer: PPO | Source: Ambulatory Visit

## 2020-09-27 NOTE — Patient Instructions (Addendum)
DUE TO COVID-19 ONLY ONE VISITOR IS ALLOWED TO COME WITH YOU AND STAY IN THE WAITING ROOM ONLY DURING PRE OP AND PROCEDURE.    COVID SWAB TESTING MUST BE COMPLETED ON:  Thursday, 10-07-20 @ 2:45 PM   [address] [place], [place] [address]  (Must self quarantine after testing. Follow instructions on handout.)   Your procedure is scheduled on:    Report to [place]  Entrance   Report to admitting at 9:30 AM   Call this number if you have problems the morning of surgery [phone]   Do not eat food :After Midnight.   May have liquids until 8:30 AM day of surgery  CLEAR LIQUID DIET  Foods Allowed                                                                     Foods Excluded  Water, Black Coffee and tea, regular and decaf             liquids that you cannot  Plain Jell-O in any flavor  (No red)                                    see through such as: Fruit ices (not with fruit pulp)                                      milk, soups, orange juice              Iced Popsicles (No red)                                      All solid food                                   Apple juices Sports drinks like Gatorade (No red) Lightly seasoned clear broth or consume(fat free) Sugar, honey syrup     Oral Hygiene is also important to reduce your risk of infection.                                    Remember - BRUSH YOUR TEETH THE MORNING OF SURGERY WITH YOUR REGULAR TOOTHPASTE   Do NOT smoke after Midnight   Take these medicines the morning of surgery with A SIP OF WATER: Amlodipine, Atorvastatin, Carvediolol  How to Manage Your Diabetes Before and After Surgery  Why is it important to control my blood sugar before and after surgery? . Improving blood sugar levels before and after surgery helps healing and can limit problems. . A way of improving blood sugar control is eating a healthy diet by: o  Eating less sugar and carbohydrates o  Increasing  activity/exercise o  Talking with your doctor about reaching your blood sugar goals . High blood sugars (greater than 180 mg/dL) can raise your risk  of infections and slow your recovery, so you will need to focus on controlling your diabetes during the weeks before surgery. . Make sure that the doctor who takes care of your diabetes knows about your planned surgery including the date and location.  How do I manage my blood sugar before surgery? . Check your blood sugar at least 4 times a day, starting 2 days before surgery, to make sure that the level is not too high or low. o Check your blood sugar the morning of your surgery when you wake up and every 2 hours until you get to the Short Stay unit. . If your blood sugar is less than 70 mg/dL, you will need to treat for low blood sugar: o Do not take insulin. o Treat a low blood sugar (less than 70 mg/dL) with  cup of clear juice (cranberry or apple), 4 glucose tablets, OR glucose gel. o Recheck blood sugar in 15 minutes after treatment (to make sure it is greater than 70 mg/dL). If your blood sugar is not greater than 70 mg/dL on recheck, call [phone] for further instructions. . Report your blood sugar to the short stay nurse when you get to Short Stay.  . If you are admitted to the hospital after surgery: o Your blood sugar will be checked by the staff and you will probably be given insulin after surgery (instead of oral diabetes medicines) to make sure you have good blood sugar levels. o The goal for blood sugar control after surgery is 80-180 mg/dL.   WHAT DO I DO ABOUT MY DIABETES MEDICATION?  Marland Kitchen Do not take oral diabetes medicines (pills) the morning of surgery.  . THE DAY BEFORE SURGERY:  Take Metformin as prescribed.       . THE MORNING OF SURGERY:  Do not take Metformin.   Reviewed and Endorsed by [facility] Patient Education Committee, August 2015                                You may not have any metal on your body  including hair pins, jewelry, and body piercings             Do not wear make-up, lotions, powders, perfumes/cologne, or deodorant             Do not wear nail polish.  Do not shave  48 hours prior to surgery.    Do not bring valuables to the hospital. [place].   Contacts, dentures or bridgework may not be worn into surgery.   Patients discharged the day of surgery will not be allowed to drive home.                Please read over the following fact sheets you were given: IF YOU HAVE QUESTIONS ABOUT YOUR PRE OP INSTRUCTIONS PLEASE CALL  [facility] - Preparing for Surgery Before surgery, you can play an important role.  Because skin is not sterile, your skin needs to be as free of germs as possible.  You can reduce the number of germs on your skin by washing with CHG (chlorahexidine gluconate) soap before surgery.  CHG is an antiseptic cleaner which kills germs and bonds with the skin to continue killing germs even after washing. Please DO NOT use if you have  an allergy to CHG or antibacterial soaps.  If your skin becomes reddened/irritated stop using the CHG and inform your nurse when you arrive at Short Stay. Do not shave (including legs and underarms) for at least 48 hours prior to the first CHG shower.  You may shave your face/neck.  Please follow these instructions carefully:  1.  Shower with CHG Soap the night before surgery and the  morning of surgery.  2.  If you choose to wash your hair, wash your hair first as usual with your normal  shampoo.  3.  After you shampoo, rinse your hair and body thoroughly to remove the shampoo.                             4.  Use CHG as you would any other liquid soap.  You can apply chg directly to the skin and wash.  Gently with a scrungie or clean washcloth.  5.  Apply the CHG Soap to your body ONLY FROM THE NECK DOWN.   Do   not use on face/ open                           Wound  or open sores. Avoid contact with eyes, ears mouth and   genitals (private parts).                       Wash face,  Genitals (private parts) with your normal soap.             6.  Wash thoroughly, paying special attention to the area where your    surgery  will be performed.  7.  Thoroughly rinse your body with warm water from the neck down.  8.  DO NOT shower/wash with your normal soap after using and rinsing off the CHG Soap.                9.  Pat yourself dry with a clean towel.            10.  Wear clean pajamas.            11.  Place clean sheets on your bed the night of your first shower and do not  sleep with pets. Day of Surgery : Do not apply any lotions/deodorants the morning of surgery.  Please wear clean clothes to the hospital/surgery center.  FAILURE TO FOLLOW THESE INSTRUCTIONS MAY RESULT IN THE CANCELLATION OF YOUR SURGERY  PATIENT SIGNATURE_________________________________  NURSE SIGNATURE__________________________________  ________________________________________________________________________

## 2020-09-27 NOTE — Progress Notes (Addendum)
COVID Vaccine Completed: x3 Date COVID Vaccine completed: 07-09-19, 08-06-19 Has received booster:  05-19-20 COVID vaccine manufacturer: [employer]   Date of COVID positive in last 90 days:  N/A  PCP - Donney Dice, DO Cardiologist - N/A  Chest x-ray - CT chest 09-04-20 Epic EKG - 09-04-20 Epic Stress Test -  ECHO -  Cardiac Cath -  Pacemaker/ICD device last checked: Spinal Cord Stimulator:  Sleep Study - N/A CPAP -   Fasting Blood Sugar -  Checks Blood Sugar - does not check  Blood Thinner Instructions: N/A Aspirin Instructions: Last Dose:  Activity level:  Can go up a flight of stairs and perform activities of daily living without stopping and without symptoms of chest pain or shortness of breath.   Able to exercise without symptoms.  Pt works full-time as a Environmental manager review:  N/A  Patient denies shortness of breath, fever, cough and chest pain at PAT appointment   Patient verbalized understanding of instructions that were given to them at the PAT appointment. Patient was also instructed that they will need to review over the PAT instructions again at home before surgery.

## 2020-09-28 ENCOUNTER — Encounter (HOSPITAL_COMMUNITY): Payer: Self-pay

## 2020-09-28 ENCOUNTER — Encounter (HOSPITAL_COMMUNITY)
Admission: RE | Admit: 2020-09-28 | Discharge: 2020-09-28 | Disposition: A | Discharge status: Home / Self Care | Payer: PPO | Source: Ambulatory Visit | Attending: Urology | Admitting: Urology

## 2020-09-28 ENCOUNTER — Other Ambulatory Visit: Payer: Self-pay

## 2020-09-28 DIAGNOSIS — Z01812 Encounter for preprocedural laboratory examination: Secondary | ICD-10-CM | POA: Insufficient documentation

## 2020-09-28 HISTORY — DX: Unspecified osteoarthritis, unspecified site: M19.90

## 2020-09-28 LAB — BASIC METABOLIC PANEL
Anion gap: 9 (ref 5–15)
BUN: 10 mg/dL (ref 8–23)
CO2: 28 mmol/L (ref 22–32)
Calcium: 9.8 mg/dL (ref 8.9–10.3)
Chloride: 106 mmol/L (ref 98–111)
Creatinine, Ser: 0.72 mg/dL (ref 0.44–1.00)
GFR, Estimated: >60 mL/min (ref >60)
Glucose, Bld: 147 mg/dL — ABNORMAL HIGH (ref 70–99)
Potassium: 4.5 mmol/L (ref 3.5–5.1)
Sodium: 143 mmol/L (ref 135–145)

## 2020-09-28 LAB — CBC
HCT: 36.1 % (ref 36.0–46.0)
Hemoglobin: 11.6 g/dL — ABNORMAL LOW (ref 12.0–15.0)
MCH: 28.2 pg (ref 26.0–34.0)
MCHC: 32.1 g/dL (ref 30.0–36.0)
MCV: 87.8 fL (ref 80.0–100.0)
Platelets: 228 10*3/uL (ref 150–400)
RBC: 4.11 MIL/uL (ref 3.87–5.11)
RDW: 13.8 % (ref 11.5–15.5)
WBC: 6.7 10*3/uL (ref 4.0–10.5)
nRBC: 0 % (ref 0.0–0.2)

## 2020-09-28 LAB — HEMOGLOBIN A1C
Hgb A1c MFr Bld: 7.3 % — ABNORMAL HIGH (ref 4.8–5.6)
Mean Plasma Glucose: 162.81 mg/dL

## 2020-09-28 LAB — GLUCOSE, CAPILLARY: Glucose-Capillary: 151 mg/dL — ABNORMAL HIGH (ref 70–99)

## 2020-10-07 ENCOUNTER — Other Ambulatory Visit (HOSPITAL_COMMUNITY)
Admission: RE | Admit: 2020-10-07 | Discharge: 2020-10-07 | Disposition: A | Discharge status: Home / Self Care | Payer: PPO | Source: Ambulatory Visit | Attending: Urology | Admitting: Urology

## 2020-10-07 DIAGNOSIS — Z01812 Encounter for preprocedural laboratory examination: Secondary | ICD-10-CM | POA: Insufficient documentation

## 2020-10-07 DIAGNOSIS — Z20822 Contact with and (suspected) exposure to covid-19: Secondary | ICD-10-CM | POA: Diagnosis not present

## 2020-10-07 LAB — SARS CORONAVIRUS 2 (TAT 6-24 HRS): SARS Coronavirus 2: NEGATIVE

## 2020-10-08 NOTE — H&P (Signed)
Office Visit Report     09/14/2020   --------------------------------------------------------------------------------   Elliot Dally. Gosse  MRN: [id number]  DOB: [birth year], 70 year old Female  SSN:    PRIMARY CARE:    REFERRING:  Marda Stalker, MD  PROVIDER:  Raynelle Bring, M.D.  LOCATION:  [facility] [phone]     --------------------------------------------------------------------------------   CC/HPI: Right renal pelvic tumor   Ms Ashley Sloan is a 70 year old female who recently presented to the emergency department on 09/04/2020 with abdominal pain and not feeling well in general. She was felt to probably have diverticulitis and was treated with antibiotic therapy. Incidentally, she was noted to have hyperdense material in the right renal pelvis concerning for possible tumor. On questioning her, she states that she has had intermittent, painless gross hematuria over the past few months. She has a distant history of tobacco use but did smoke for almost 30 years before quitting in the 1980s. Her serum creatinine is 0.75. Hemoglobin is 12.4. She denies any right-sided flank pain.     ALLERGIES: None   MEDICATIONS: Cipro 500 mg tablet  Amlodipine Besylate 5 mg tablet  Atorvastatin Calcium 40 mg tablet  Carvedilol 25 mg tablet  Metformin Er Gastric 1,000 mg tablet, er gastric retention 24 hr  Metronidazole 500 mg tablet  Ondansetron Hcl 4 mg tablet  Oxycodone-Acetaminophen 5 mg-325 mg tablet     GU PSH: None   NON-GU PSH: Breast Surgery Procedure, Left     GU PMH: None   NON-GU PMH: Arthritis Diabetes Type 2 Gout Hypercholesterolemia Hypertension    FAMILY HISTORY: 1 Daughter - Runs in Family   SOCIAL HISTORY: Marital Status: Widowed Ethnicity: Not Hispanic Or Latino; Race: Black or African American Current Smoking Status: Patient does not smoke anymore.   Tobacco Use Assessment Completed: Used Tobacco in last 30 days? Has never drank.   Drinks 3 caffeinated drinks per day.    REVIEW OF SYSTEMS:    GU Review Female:   Patient reports get up at night to urinate. Patient denies frequent urination, hard to postpone urination, burning /pain with urination, leakage of urine, stream starts and stops, trouble starting your stream, have to strain to urinate, and currently pregnant.  Gastrointestinal (Lower):   Patient denies diarrhea and constipation.  Gastrointestinal (Upper):   Patient denies nausea and vomiting.  Constitutional:   Patient denies fever, night sweats, weight loss, and fatigue.  Skin:   Patient denies skin rash/ lesion and itching.  Eyes:   Patient denies blurred vision and double vision.  Ears/ Nose/ Throat:   Patient denies sore throat and sinus problems.  Hematologic/Lymphatic:   Patient denies swollen glands and easy bruising.  Cardiovascular:   Patient denies leg swelling and chest pains.  Respiratory:   Patient denies cough and shortness of breath.  Endocrine:   Patient denies excessive thirst.  Musculoskeletal:   Patient denies back pain and joint pain.  Neurological:   Patient denies headaches and dizziness.  Psychologic:   Patient denies depression and anxiety.   VITAL SIGNS:      09/14/2020 03:25 PM  Weight 163 lb / 73.94 kg  Height 60 in / 152.4 cm  BP 151/77 mmHg  Pulse 75 /min  Temperature 97.1 F / 36.1 C  BMI 31.8 kg/m   MULTI-SYSTEM PHYSICAL EXAMINATION:    Constitutional: Well-nourished. No physical deformities. Normally developed. Good grooming.  Neck: Neck symmetrical, not swollen. Normal tracheal position.  Respiratory: No labored breathing, no use of accessory  muscles. Clear bilaterally.  Cardiovascular: Normal temperature, normal extremity pulses, no swelling, no varicosities. Regular rate and rhythm.  Lymphatic: No enlargement of neck, axillae, groin.  Skin: No paleness, no jaundice, no cyanosis. No lesion, no ulcer, no rash.  Neurologic / Psychiatric: Oriented to time, oriented to  place, oriented to person. No depression, no anxiety, no agitation.  Gastrointestinal: Obese, soft, nondistended. No CVA tenderness. She does have mild left sided abdominal tenderness.  Eyes: Normal conjunctivae. Normal eyelids.  Ears, Nose, Mouth, and Throat: Left ear no scars, no lesions, no masses. Right ear no scars, no lesions, no masses. Nose no scars, no lesions, no masses. Normal hearing. Normal lips.  Musculoskeletal: Normal gait and station of head and neck.     Complexity of Data:  Lab Test Review:   BUN/Creatinine  Records Review:   Previous Patient Records  X-Ray Review: C.T. Chest/ Abd/Pelvis: Reviewed Films.    Notes:                     CLINICAL DATA: Abdominal pain. Aortic dissection suspected. High  blood pressure. Also consider pyelonephritis, stones,  diverticulitis, cholecystitis, and pancreatitis.   EXAM:  CT ANGIOGRAPHY CHEST, ABDOMEN AND PELVIS   TECHNIQUE:  Non-contrast CT of the chest was initially obtained.   Multidetector CT imaging through the chest, abdomen and pelvis was  performed using the standard protocol during bolus administration of  intravenous contrast. Multiplanar reconstructed images and MIPs were  obtained and reviewed to evaluate the vascular anatomy.   CONTRAST: 57mL OMNIPAQUE IOHEXOL 350 MG/ML SOLN   COMPARISON: CT scan of the abdomen and pelvis without contrast  June 27, 2018   FINDINGS:  CTA CHEST FINDINGS   Cardiovascular: Calcified atherosclerosis is seen in the thoracic  aorta without aneurysm. No thoracic aortic dissection. The heart  size is unremarkable. Calcified atherosclerosis is seen in the right  coronary artery. There may be minimal calcified atherosclerosis in  the proximal left LAD as seen on series 5, image 27. The study was  not tailored to evaluate the pulmonary arteries. However, the  pulmonary arteries are well opacified and there are no pulmonary  emboli identified.   Mediastinum/Nodes: No effusions.  Surgical clips seen in the left  axilla. The chest wall is unremarkable. The thyroid is normal. No  adenopathy in the chest. There is a small hiatal hernia. The  esophagus is otherwise unremarkable.   Lungs/Pleura: The central airways are normal. No pneumothorax. There  is a 4 mm nodule in the left base on series 8, image 46. No other  pulmonary nodules or masses. No suspicious infiltrates to suggest  pneumonia or aspiration.   Musculoskeletal: Degenerative changes in the thoracic spine. No  other bony abnormalities.   Review of the MIP images confirms the above findings.   CTA ABDOMEN AND PELVIS FINDINGS   VASCULAR   Aorta: No aneurysm in the abdominal aorta. No dissection.  Atherosclerotic changes are seen.   Celiac: Atherosclerotic changes are seen in the origin of the celiac  artery without significant stenosis.   SMA: Patent without evidence of aneurysm, dissection, vasculitis or  significant stenosis.   Renals: Both renal arteries are patent without evidence of aneurysm,  dissection, vasculitis, fibromuscular dysplasia or significant  stenosis.   IMA: Patent without evidence of aneurysm, dissection, vasculitis or  significant stenosis.   Inflow: Patent without evidence of aneurysm, dissection, vasculitis  or significant stenosis.   Veins: No obvious venous abnormality within the limitations of this  arterial phase study.   Review of the MIP images confirms the above findings.   NON-VASCULAR   Hepatobiliary: No focal liver abnormality is seen. No gallstones,  gallbladder wall thickening, or biliary dilatation.   Pancreas: Unremarkable. No pancreatic ductal dilatation or  surrounding inflammatory changes.   Spleen: Normal in size without focal abnormality.   Adrenals/Urinary Tract: Adrenal glands are normal. There is a cyst  in the lower pole the left kidney. Evaluation for stones is limited  due to contrast in the renal collecting systems. A few small  stones  are not excluded on the left. No hydronephrosis or perinephric  stranding on the left. The left ureter is normal in appearance.  There is mild hydronephrosis on the right which is a new finding. No  perinephric stranding. There is increased attenuation in the mildly  prominent right renal pelvis, extending into the proximal right  ureter and a left-sided indibulin as seen on axial image 108. The  right ureter is nondilated. The bladder is unremarkable.   Stomach/Bowel: There is a small hiatal hernia. The stomach and small  bowel are normal. Colonic diverticulosis is identified. There is fat  stranding adjacent to the distal descending colon near its junction  with the sigmoid colon consistent with diverticulitis. No evidence  of perforation. The remainder of the colon is normal. The appendix  is normal.   Lymphatic: No adenopathy identified.   Reproductive: Uterus and bilateral adnexa are unremarkable.   Other: No free air. There is fluid in the pelvis, likely secondary  to the diverticulitis described above. No abscess.   Musculoskeletal: Sclerosis in the right hip is unchanged since 2019,  likely a bone island. Degenerative changes seen in the lumbar spine.   Review of the MIP images confirms the above findings.   IMPRESSION:  1. Increased attenuation in the right renal pelvis extending into  the proximal right ureter and a right-sided infundibulum worrisome  for neoplasm/malignancy such as a transitional cell carcinoma. The  findings are not completely specific. Recommend urologic  consultation.  2. Diverticulitis in the distal descending colon, likely explaining  the patient's symptoms.  3. Atherosclerotic change in the nonaneurysmal aorta. No dissection.  4. Free fluid in the pelvis is likely secondary to the  diverticulitis.  5. No other acute abnormalities.    Electronically Signed  By: Dorise Bullion III M.D  On: 09/04/2020 10:30   PROCEDURES:           Urinalysis w/Scope Dipstick Dipstick Cont'd Micro  Color: Amber Bilirubin: Neg mg/dL WBC/hpf: 10 - 20/hpf  Appearance: Clear Ketones: Neg mg/dL RBC/hpf: >60/hpf  Specific Gravity: >1.030 Blood: 3+ ery/uL Bacteria: Mod (26-50/hpf)  pH: 5.5 Protein: 1+ mg/dL Cystals: NS (Not Seen)  Glucose: Neg mg/dL Urobilinogen: 2.0 mg/dL Casts: NS (Not Seen)    Nitrites: Neg Trichomonas: Not Present    Leukocyte Esterase: 3+ leu/uL Mucous: Not Present      Epithelial Cells: 6 - 10/hpf      Yeast: NS (Not Seen)      Sperm: Not Present    Notes: QNS for spun micro few renal    ASSESSMENT:      ICD-10 Details  1 GU:   Renal pelvis cancer, 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  Document Letter(s):  Created for Patient: Clinical Summary         Notes:   1. Right renal pelvic neoplasm: We discussed her CT findings that do indicate a high suspicion for a possible renal pelvic tumor. I have recommended that she proceed with cystoscopy, right retrograde pyelography, right ureteroscopy with biopsy and right ureteral stent placement. We reviewed the potential risks, complications, and expected recovery process associated with this procedure. She gives informed consent to proceed. This will be scheduled for the near future.   CC: Dr. Donney Dice    * Signed by Raynelle Bring, M.D. on 09/14/20 at 9:05 PM (EDT)*

## 2020-10-11 ENCOUNTER — Encounter (HOSPITAL_COMMUNITY): Payer: Self-pay | Admitting: Urology

## 2020-10-11 ENCOUNTER — Ambulatory Visit (HOSPITAL_COMMUNITY): Payer: PPO

## 2020-10-11 ENCOUNTER — Ambulatory Visit (HOSPITAL_COMMUNITY): Payer: PPO | Admitting: Certified Registered Nurse Anesthetist

## 2020-10-11 ENCOUNTER — Encounter (HOSPITAL_COMMUNITY)
Admission: RE | Disposition: A | Discharge status: Home / Self Care | Payer: Self-pay | Source: Ambulatory Visit | Attending: Urology

## 2020-10-11 ENCOUNTER — Ambulatory Visit (HOSPITAL_COMMUNITY)
Admission: RE | Admit: 2020-10-11 | Discharge: 2020-10-11 | Disposition: A | Discharge status: Home / Self Care | Payer: PPO | Source: Ambulatory Visit | Attending: Urology | Admitting: Urology

## 2020-10-11 DIAGNOSIS — Z7984 Long term (current) use of oral hypoglycemic drugs: Secondary | ICD-10-CM | POA: Insufficient documentation

## 2020-10-11 DIAGNOSIS — Z87891 Personal history of nicotine dependence: Secondary | ICD-10-CM | POA: Insufficient documentation

## 2020-10-11 DIAGNOSIS — I700 Atherosclerosis of aorta: Secondary | ICD-10-CM | POA: Diagnosis not present

## 2020-10-11 DIAGNOSIS — K5730 Diverticulosis of large intestine without perforation or abscess without bleeding: Secondary | ICD-10-CM | POA: Diagnosis not present

## 2020-10-11 DIAGNOSIS — Z79899 Other long term (current) drug therapy: Secondary | ICD-10-CM | POA: Diagnosis not present

## 2020-10-11 DIAGNOSIS — C651 Malignant neoplasm of right renal pelvis: Secondary | ICD-10-CM | POA: Diagnosis not present

## 2020-10-11 DIAGNOSIS — D0919 Carcinoma in situ of other urinary organs: Secondary | ICD-10-CM | POA: Diagnosis not present

## 2020-10-11 DIAGNOSIS — D49511 Neoplasm of unspecified behavior of right kidney: Secondary | ICD-10-CM | POA: Diagnosis not present

## 2020-10-11 DIAGNOSIS — N2889 Other specified disorders of kidney and ureter: Secondary | ICD-10-CM | POA: Diagnosis not present

## 2020-10-11 DIAGNOSIS — I10 Essential (primary) hypertension: Secondary | ICD-10-CM | POA: Diagnosis not present

## 2020-10-11 DIAGNOSIS — E119 Type 2 diabetes mellitus without complications: Secondary | ICD-10-CM | POA: Diagnosis not present

## 2020-10-11 HISTORY — PX: CYSTOSCOPY WITH URETEROSCOPY AND STENT PLACEMENT: SHX6377

## 2020-10-11 LAB — GLUCOSE, CAPILLARY
Glucose-Capillary: 117 mg/dL — ABNORMAL HIGH (ref 70–99)
Glucose-Capillary: 88 mg/dL (ref 70–99)

## 2020-10-11 SURGERY — CYSTOURETEROSCOPY, WITH STENT INSERTION
Anesthesia: General | Laterality: Right

## 2020-10-11 MED ORDER — ONDANSETRON HCL 4 MG/2ML IJ SOLN
INTRAMUSCULAR | Status: AC
  Filled 2020-10-11: qty 2

## 2020-10-11 MED ORDER — FENTANYL CITRATE (PF) 100 MCG/2ML IJ SOLN
INTRAMUSCULAR | Status: DC | PRN
  Administered 2020-10-11 (×2): 25 ug via INTRAVENOUS

## 2020-10-11 MED ORDER — PHENYLEPHRINE 40 MCG/ML (10ML) SYRINGE FOR IV PUSH (FOR BLOOD PRESSURE SUPPORT)
PREFILLED_SYRINGE | INTRAVENOUS | Status: AC
  Filled 2020-10-11: qty 10

## 2020-10-11 MED ORDER — LIDOCAINE 2% (20 MG/ML) 5 ML SYRINGE
INTRAMUSCULAR | Status: DC | PRN
  Administered 2020-10-11: 100 mg via INTRAVENOUS

## 2020-10-11 MED ORDER — FENTANYL CITRATE (PF) 100 MCG/2ML IJ SOLN
INTRAMUSCULAR | Status: AC
  Filled 2020-10-11: qty 2

## 2020-10-11 MED ORDER — EPHEDRINE SULFATE-NACL 50-0.9 MG/10ML-% IV SOSY
PREFILLED_SYRINGE | INTRAVENOUS | Status: DC | PRN
  Administered 2020-10-11 (×3): 5 mg via INTRAVENOUS

## 2020-10-11 MED ORDER — FENTANYL CITRATE (PF) 100 MCG/2ML IJ SOLN: 25.0000 ug | INTRAMUSCULAR | Status: DC | PRN

## 2020-10-11 MED ORDER — PROPOFOL 10 MG/ML IV BOLUS
INTRAVENOUS | Status: AC
  Filled 2020-10-11: qty 20

## 2020-10-11 MED ORDER — PHENYLEPHRINE 40 MCG/ML (10ML) SYRINGE FOR IV PUSH (FOR BLOOD PRESSURE SUPPORT)
PREFILLED_SYRINGE | INTRAVENOUS | Status: DC | PRN
  Administered 2020-10-11 (×3): 80 ug via INTRAVENOUS
  Administered 2020-10-11: 40 ug via INTRAVENOUS

## 2020-10-11 MED ORDER — PROMETHAZINE HCL 25 MG/ML IJ SOLN: 6.2500 mg | INTRAMUSCULAR | Status: DC | PRN

## 2020-10-11 MED ORDER — DEXAMETHASONE SODIUM PHOSPHATE 10 MG/ML IJ SOLN
INTRAMUSCULAR | Status: DC | PRN
  Administered 2020-10-11: 5 mg via INTRAVENOUS

## 2020-10-11 MED ORDER — SODIUM CHLORIDE 0.9 % IR SOLN
Status: DC | PRN
  Administered 2020-10-11: 6000 mL

## 2020-10-11 MED ORDER — PHENAZOPYRIDINE HCL 200 MG PO TABS: 200.0000 mg | ORAL_TABLET | Freq: Three times a day (TID) | ORAL | 0 refills | Status: DC | PRN

## 2020-10-11 MED ORDER — DEXAMETHASONE SODIUM PHOSPHATE 10 MG/ML IJ SOLN
INTRAMUSCULAR | Status: AC
  Filled 2020-10-11: qty 1

## 2020-10-11 MED ORDER — ONDANSETRON HCL 4 MG/2ML IJ SOLN
INTRAMUSCULAR | Status: DC | PRN
  Administered 2020-10-11: 4 mg via INTRAVENOUS

## 2020-10-11 MED ORDER — CHLORHEXIDINE GLUCONATE 0.12 % MT SOLN
15.0000 mL | Freq: Once | OROMUCOSAL | Status: AC
  Administered 2020-10-11: 15 mL via OROMUCOSAL

## 2020-10-11 MED ORDER — IOHEXOL 300 MG/ML  SOLN
INTRAMUSCULAR | Status: DC | PRN
  Administered 2020-10-11: 5 mL

## 2020-10-11 MED ORDER — CEFAZOLIN SODIUM-DEXTROSE 2-4 GM/100ML-% IV SOLN
2.0000 g | Freq: Once | INTRAVENOUS | Status: AC
  Administered 2020-10-11: 2 g via INTRAVENOUS
  Filled 2020-10-11: qty 100

## 2020-10-11 MED ORDER — LIDOCAINE 2% (20 MG/ML) 5 ML SYRINGE
INTRAMUSCULAR | Status: AC
  Filled 2020-10-11: qty 5

## 2020-10-11 MED ORDER — ORAL CARE MOUTH RINSE: 15.0000 mL | Freq: Once | OROMUCOSAL | Status: AC

## 2020-10-11 MED ORDER — PROPOFOL 10 MG/ML IV BOLUS
INTRAVENOUS | Status: DC | PRN
  Administered 2020-10-11: 150 mg via INTRAVENOUS

## 2020-10-11 MED ORDER — KETOROLAC TROMETHAMINE 30 MG/ML IJ SOLN: 30.0000 mg | Freq: Once | INTRAMUSCULAR | Status: DC | PRN

## 2020-10-11 MED ORDER — EPHEDRINE 5 MG/ML INJ
INTRAVENOUS | Status: AC
  Filled 2020-10-11: qty 10

## 2020-10-11 MED ORDER — LACTATED RINGERS IV SOLN: INTRAVENOUS | Status: DC

## 2020-10-11 SURGICAL SUPPLY — 23 items
BAG URO CATCHER STRL LF (MISCELLANEOUS) ×2 IMPLANT
BASKET STNLS GEMINI 4WIRE 3FR (BASKET) ×1 IMPLANT
BASKET ZERO TIP NITINOL 2.4FR (BASKET) IMPLANT
BSKT STON RTRVL GEM 120X11 3FR (BASKET) ×1
BSKT STON RTRVL ZERO TP 2.4FR (BASKET)
CATH INTERMIT  6FR 70CM (CATHETERS) IMPLANT
CLOTH BEACON ORANGE TIMEOUT ST (SAFETY) ×2 IMPLANT
GLOVE SURG ENC TEXT LTX SZ7.5 (GLOVE) ×2 IMPLANT
GOWN STRL REUS W/TWL LRG LVL3 (GOWN DISPOSABLE) ×2 IMPLANT
GUIDEWIRE STR DUAL SENSOR (WIRE) ×2 IMPLANT
GUIDEWIRE ZIPWRE .038 STRAIGHT (WIRE) IMPLANT
IV NS 1000ML (IV SOLUTION) ×2
IV NS 1000ML BAXH (IV SOLUTION) ×1 IMPLANT
KIT TURNOVER KIT A (KITS) ×2 IMPLANT
LASER FIB FLEXIVA PULSE ID 365 (Laser) IMPLANT
MANIFOLD NEPTUNE II (INSTRUMENTS) ×2 IMPLANT
PACK CYSTO (CUSTOM PROCEDURE TRAY) ×2 IMPLANT
SHEATH URETERAL 12FRX35CM (MISCELLANEOUS) ×1 IMPLANT
STENT URET 6FRX24 CONTOUR (STENTS) ×1 IMPLANT
TRACTIP FLEXIVA PULS ID 200XHI (Laser) IMPLANT
TRACTIP FLEXIVA PULSE ID 200 (Laser)
TUBING CONNECTING 10 (TUBING) ×2 IMPLANT
TUBING UROLOGY SET (TUBING) ×2 IMPLANT

## 2020-10-11 NOTE — Discharge Instructions (Signed)

## 2020-10-11 NOTE — Anesthesia Procedure Notes (Signed)
Procedure Name: LMA Insertion Date/Time: 10/11/2020 11:34 AM Performed by: Maxwell Caul, CRNA Pre-anesthesia Checklist: Patient identified, Emergency Drugs available, Suction available and Patient being monitored Patient Re-evaluated:Patient Re-evaluated prior to induction Oxygen Delivery Method: Circle system utilized Preoxygenation: Pre-oxygenation with 100% oxygen Induction Type: IV induction LMA: LMA inserted LMA Size: 4.0 Number of attempts: 1 Placement Confirmation: positive ETCO2 and breath sounds checked- equal and bilateral Tube secured with: Tape Dental Injury: Teeth and Oropharynx as per pre-operative assessment

## 2020-10-11 NOTE — Anesthesia Preprocedure Evaluation (Addendum)
Anesthesia Evaluation  Patient identified by MRN, date of birth, ID band Patient awake    Reviewed: Allergy & Precautions, NPO status , Patient's Chart, lab work & pertinent test results  Airway Mallampati: II  TM Distance: >3 FB     Dental  (+) Teeth Intact   Pulmonary neg pulmonary ROS, former smoker,    Pulmonary exam normal        Cardiovascular hypertension, Pt. on medications and Pt. on home beta blockers  Rhythm:Regular     Neuro/Psych negative neurological ROS  negative psych ROS   GI/Hepatic negative GI ROS, Neg liver ROS,   Endo/Other  diabetes, Well Controlled, Type 2, Oral Hypoglycemic Agents  Renal/GU Right pelvic tumor  negative genitourinary   Musculoskeletal  (+) Arthritis , Osteoarthritis,  Left breast Ca   Abdominal (+)  Abdomen: soft.    Peds  Hematology negative hematology ROS (+)   Anesthesia Other Findings   Reproductive/Obstetrics                            Anesthesia Physical Anesthesia Plan  ASA: III  Anesthesia Plan: General   Post-op Pain Management:    Induction: Intravenous  PONV Risk Score and Plan: 3 and Ondansetron, Dexamethasone and Treatment may vary due to age or medical condition  Airway Management Planned: Mask and LMA  Additional Equipment: None  Intra-op Plan:   Post-operative Plan: Extubation in OR  Informed Consent:   Plan Discussed with:   Anesthesia Plan Comments: (Lab Results      Component                Value               Date                      WBC                      6.7                 09/28/2020                HGB                      11.6 (L)            09/28/2020                HCT                      36.1                09/28/2020                MCV                      87.8                09/28/2020                PLT                      228                 09/28/2020           Lab Results      Component  Value               Date                      NA                       143                 09/28/2020                K                        4.5                 09/28/2020                CO2                      28                  09/28/2020                GLUCOSE                  147 (H)             09/28/2020                BUN                      10                  09/28/2020                CREATININE               0.72                09/28/2020                CALCIUM                  9.8                 09/28/2020                GFRNONAA                 >60                 09/28/2020                GFRAA                    72                  02/02/2020          )        Anesthesia Quick Evaluation

## 2020-10-11 NOTE — Op Note (Signed)
Preoperative diagnosis: Right renal pelvic tumor  Postoperative diagnosis: Right renal pelvic tumor  Procedures: 1.  Cystoscopy 2.  Right retrograde pyelography with interpretation 3.  Right ureteroscopy with biopsy of right renal pelvic tumor 4.  Washing of right renal collecting system for cytology 5.  Right ureteral stent placement (6 x 24 -no string)  Surgeon: Roxy Horseman, Brooke Bonito MD  Anesthesia: General  Complications: None  EBL: Minimal  Specimens: 1.  Biopsies of right renal pelvic tumor 2.  Right renal pelvic washing for cytology  Disposition of specimen: Pathology  Intraoperative findings: Right retrograde pyelography was performed with a 6 French ureteral catheter and Omnipaque contrast.  This revealed a normal caliber ureter without filling defects.  There was a large filling defect within the right renal pelvis consistent with preoperative imaging.  This was suspicious for possible tumor.  No hydronephrosis noted.  Indication: Ashley Sloan is a 70 year old female who recently had presented to the emergency department with diverticulitis.  Incidentally, on her imaging, she was noted to have a possible right renal pelvic tumor.  She also had a history of recurrent hematuria.  Based on her findings, she was recommended to undergo the above procedures for further diagnostic evaluation.  The potential risks, complications, and expected recovery process was discussed in detail.  Informed consent was obtained.  Description of procedure: The patient was taken the operating room and a general anesthetic was administered.  She was given preoperative antibiotics, placed in the dorsolithotomy position, and prepped and draped in usual sterile fashion.  A preoperative timeout was performed.  Cystourethroscopy was then performed with a 30 degree lens.  The ureteral orifice ease were noted to be in their expected anatomic location.  No bloody efflux was noted.  No bladder tumors, stones, or  other abnormal mucosal pathology was identified.  Attention then turned to the right ureteral orifice.  This was cannulated with a 6 French ureteral catheter and Omnipaque contrast was injected with findings as dictated above.  There were findings suspicious for a tumor as noted on preoperative imaging.  A 0.38 sensor guidewire was then advanced up into the right renal collecting system under fluoroscopic guidance.  A 12/14 ureteral access sheath was then advanced over the wire into the proximal ureter.  A digital flexible ureteroscope was then advanced up into the right renal collecting system and there was noted to be a large papillary tumor at the level of the ureteropelvic junction and filling the majority of the renal pelvis.  Inspection beyond the tumor revealed no abnormalities within the calyceal systems.  A Gemini stone basket was then advanced through the ureteroscope and used to perform multiple biopsies of the papillary tumor growth.  This was sent for permanent pathologic analysis.  In addition, a washing was obtained for cytology from the right renal pelvis.  The wire was then left in place and the ureteral access sheath was removed.  The wire was backloaded on the cystoscope and a 6 x 24 double-J ureteral stent was advanced over the wire using Seldinger technique.  It was positioned appropriately under fluoroscopic cystoscopic guidance and the wire was removed.  A good curl was noted in the renal pelvis as well as within the bladder.  The patient's bladder was then emptied.  The patient tolerated the procedure well and without complications.  She was able to be awakened and transferred to the recovery unit in satisfactory condition.

## 2020-10-11 NOTE — Anesthesia Postprocedure Evaluation (Addendum)
Anesthesia Post Note  Patient: Ashley Sloan  Procedure(s) Performed: CYSTOSCOPY WITH URETEROSCOPY AND STENT PLACEMENT/ RETROGRADE PYELOGRAM/ BIOPSY (Right )     Patient location during evaluation: PACU Anesthesia Type: General Level of consciousness: awake and sedated Pain management: pain level controlled Vital Signs Assessment: post-procedure vital signs reviewed and stable Respiratory status: spontaneous breathing Cardiovascular status: stable Postop Assessment: no apparent nausea or vomiting Anesthetic complications: no   No complications documented.  Last Vitals:  Vitals:   10/11/20 1245 10/11/20 1300  BP: 113/76 (!) 156/70  Pulse: 66 79  Resp: 11 16  Temp: (!) 36.1 C   SpO2: 99% 99%    Last Pain:  Vitals:   10/11/20 1300  TempSrc:   PainSc: 0-No pain                 Huston Foley

## 2020-10-11 NOTE — Transfer of Care (Signed)
Immediate Anesthesia Transfer of Care Note  Patient: Ashley Sloan  Procedure(s) Performed: CYSTOSCOPY WITH URETEROSCOPY AND STENT PLACEMENT/ RETROGRADE PYELOGRAM/ BIOPSY (Right )  Patient Location: PACU  Anesthesia Type:General  Level of Consciousness: awake, alert  and oriented  Airway & Oxygen Therapy: Patient Spontanous Breathing and Patient connected to face mask oxygen  Post-op Assessment: Report given to RN and Post -op Vital signs reviewed and stable  Post vital signs: Reviewed and stable  Last Vitals:  Vitals Value Taken Time  BP    Temp    Pulse 63 10/11/20 1211  Resp 9 10/11/20 1211  SpO2 100 % 10/11/20 1211  Vitals shown include unvalidated device data.  Last Pain:  Vitals:   10/11/20 1000  TempSrc:   PainSc: 0-No pain      Patients Stated Pain Goal: 3 (13/24/40 1027)  Complications: No complications documented.

## 2020-10-11 NOTE — Interval H&P Note (Signed)
History and Physical Interval Note:  10/11/2020 11:04 AM  Ashley Sloan  has presented today for surgery, with the diagnosis of RIGHT RENAL PELVIC TUMOR.  The various methods of treatment have been discussed with the patient and family. After consideration of risks, benefits and other options for treatment, the patient has consented to  Procedure(s): CYSTOSCOPY WITH URETEROSCOPY AND STENT PLACEMENT/ RETROGRADE PYELOGRAM/ BIOPSY (Right) as a surgical intervention.  The patient's history has been reviewed, patient examined, no change in status, stable for surgery.  I have reviewed the patient's chart and labs.  Questions were answered to the patient's satisfaction.     [employer]

## 2020-10-12 ENCOUNTER — Encounter (HOSPITAL_COMMUNITY): Payer: Self-pay | Admitting: Urology

## 2020-10-12 LAB — CYTOLOGY - NON PAP

## 2020-10-12 LAB — SURGICAL PATHOLOGY

## 2020-10-14 DIAGNOSIS — Z20828 Contact with and (suspected) exposure to other viral communicable diseases: Secondary | ICD-10-CM | POA: Diagnosis not present

## 2020-10-18 DIAGNOSIS — Z20828 Contact with and (suspected) exposure to other viral communicable diseases: Secondary | ICD-10-CM | POA: Diagnosis not present

## 2020-10-21 DIAGNOSIS — Z20828 Contact with and (suspected) exposure to other viral communicable diseases: Secondary | ICD-10-CM | POA: Diagnosis not present

## 2020-10-25 DIAGNOSIS — Z20828 Contact with and (suspected) exposure to other viral communicable diseases: Secondary | ICD-10-CM | POA: Diagnosis not present

## 2020-10-26 DIAGNOSIS — C651 Malignant neoplasm of right renal pelvis: Secondary | ICD-10-CM | POA: Diagnosis not present

## 2020-10-26 DIAGNOSIS — R8271 Bacteriuria: Secondary | ICD-10-CM | POA: Diagnosis not present

## 2020-11-02 ENCOUNTER — Other Ambulatory Visit: Payer: Self-pay | Admitting: Urology

## 2020-11-02 ENCOUNTER — Other Ambulatory Visit: Payer: Self-pay | Admitting: Family Medicine

## 2020-11-02 DIAGNOSIS — I10 Essential (primary) hypertension: Secondary | ICD-10-CM

## 2020-11-16 ENCOUNTER — Other Ambulatory Visit (HOSPITAL_COMMUNITY): Payer: Self-pay

## 2020-11-25 NOTE — Patient Instructions (Addendum)
DUE TO COVID-19 ONLY ONE VISITOR IS ALLOWED TO COME WITH YOU AND STAY IN THE WAITING ROOM ONLY DURING PRE OP AND PROCEDURE DAY OF SURGERY. THE 2 VISITORS  MAY VISIT WITH YOU AFTER SURGERY IN YOUR PRIVATE ROOM DURING VISITING HOURS ONLY!  YOU NEED TO HAVE A COVID 19 TEST ON__6/2_____ @__2 :45_____, THIS TEST MUST BE DONE BEFORE SURGERY,  COVID TESTING SITE [place] [address], IT IS ON THE RIGHT GOING OUT [address] APPROXIMATELY  2 MINUTES PAST [employer] ON THE RIGHT. ONCE YOUR COVID TEST IS COMPLETED,  PLEASE BEGIN THE QUARANTINE INSTRUCTIONS AS OUTLINED IN YOUR HANDOUT.                Ashley Sloan   Your procedure is scheduled on: 12/06/20   Report to [facility] Main  Entrance   Report to admitting at   9:30 AM     Call this number if you have problems the morning of surgery [phone]  Follow bowel prep instructions from Dr. Lynne Logan office   Remember: Do not eat food or drink liquids :After Midnight.   BRUSH YOUR TEETH MORNING OF SURGERY AND RINSE YOUR MOUTH OUT, NO CHEWING GUM CANDY OR MINTS.     Take these medicines the morning of surgery with A SIP OF WATER: Carvedilol, Amlodipine   How to Manage Your Diabetes Before and After Surgery  Why is it important to control my blood sugar before and after surgery? . Improving blood sugar levels before and after surgery helps healing and can limit problems. . A way of improving blood sugar control is eating a healthy diet by: o  Eating less sugar and carbohydrates o  Increasing activity/exercise o  Talking with your doctor about reaching your blood sugar goals . High blood sugars (greater than 180 mg/dL) can raise your risk of infections and slow your recovery, so you will need to focus on controlling your diabetes during the weeks before surgery. . Make sure that the doctor who takes care of your diabetes knows about your planned surgery including the date and location.  How do I  manage my blood sugar before surgery? . Check your blood sugar at least 4 times a day, starting 2 days before surgery, to make sure that the level is not too high or low. o Check your blood sugar the morning of your surgery when you wake up and every 2 hours until you get to the Short Stay unit. . If your blood sugar is less than 70 mg/dL, you will need to treat for low blood sugar: o Do not take insulin. o Treat a low blood sugar (less than 70 mg/dL) with  cup of clear juice (cranberry or apple), 4 glucose tablets, OR glucose gel. o Recheck blood sugar in 15 minutes after treatment (to make sure it is greater than 70 mg/dL). If your blood sugar is not greater than 70 mg/dL on recheck, call [phone] for further instructions. . Report your blood sugar to the short stay nurse when you get to Short Stay.  . If you are admitted to the hospital after surgery: o Your blood sugar will be checked by the staff and you will probably be given insulin after surgery (instead of oral diabetes medicines) to make sure you have good blood sugar levels. o The goal for blood sugar control after surgery is 80-180 mg/dL.   WHAT DO I DO ABOUT MY DIABETES MEDICATION?  Marland Kitchen Do not take oral  diabetes medicines (pills) the morning of surgery.                                  You may not have any metal on your body including hair pins and              piercings  Do not wear jewelry, make-up, lotions, powders or perfumes, deodorant             Do not wear nail polish on your fingernails.  Do not shave  48 hours prior to surgery.     Do not bring valuables to the hospital. [place].  Contacts, dentures or bridgework may not be worn into surgery.                   Please read over the following fact sheets you were given: _____________________________________________________________________             [facility] - Preparing for Surgery Before surgery,  you can play an important role.  Because skin is not sterile, your skin needs to be as free of germs as possible.  You can reduce the number of germs on your skin by washing with CHG (chlorahexidine gluconate) soap before surgery.  CHG is an antiseptic cleaner which kills germs and bonds with the skin to continue killing germs even after washing. Please DO NOT use if you have an allergy to CHG or antibacterial soaps.  If your skin becomes reddened/irritated stop using the CHG and inform your nurse when you arrive at Short Stay. Do not shave (including legs and underarms) for at least 48 hours prior to the first CHG shower.   Please follow these instructions carefully:  1.  Shower with CHG Soap the night before surgery and the  morning of Surgery.  2.  If you choose to wash your hair, wash your hair first as usual with your  normal  shampoo.  3.  After you shampoo, rinse your hair and body thoroughly to remove the  shampoo.                                        4.  Use CHG as you would any other liquid soap.  You can apply chg directly  to the skin and wash                       Gently with a scrungie or clean washcloth.  5.  Apply the CHG Soap to your body ONLY FROM THE NECK DOWN.   Do not use on face/ open                           Wound or open sores. Avoid contact with eyes, ears mouth and genitals (private parts).                       Wash face,  Genitals (private parts) with your normal soap.             6.  Wash thoroughly, paying special attention to the area where your surgery  will be performed.  7.  Thoroughly rinse  your body with warm water from the neck down.  8.  DO NOT shower/wash with your normal soap after using and rinsing off  the CHG Soap.             9.  Pat yourself dry with a clean towel.            10.  Wear clean pajamas.            11.  Place clean sheets on your bed the night of your first shower and do not  sleep with pets. Day of Surgery : Do not apply any  lotions/deodorants the morning of surgery.  Please wear clean clothes to the hospital/surgery center.  FAILURE TO FOLLOW THESE INSTRUCTIONS MAY RESULT IN THE CANCELLATION OF YOUR SURGERY PATIENT SIGNATURE_________________________________  NURSE SIGNATURE__________________________________  ________________________________________________________________________   Adam Phenix  An incentive spirometer is a tool that can help keep your lungs clear and active. This tool measures how well you are filling your lungs with each breath. Taking long deep breaths may help reverse or decrease the chance of developing breathing (pulmonary) problems (especially infection) following:  A long period of time when you are unable to move or be active. BEFORE THE PROCEDURE   If the spirometer includes an indicator to show your best effort, your nurse or respiratory therapist will set it to a desired goal.  If possible, sit up straight or lean slightly forward. Try not to slouch.  Hold the incentive spirometer in an upright position. INSTRUCTIONS FOR USE  1. Sit on the edge of your bed if possible, or sit up as far as you can in bed or on a chair. 2. Hold the incentive spirometer in an upright position. 3. Breathe out normally. 4. Place the mouthpiece in your mouth and seal your lips tightly around it. 5. Breathe in slowly and as deeply as possible, raising the piston or the ball toward the top of the column. 6. Hold your breath for 3-5 seconds or for as long as possible. Allow the piston or ball to fall to the bottom of the column. 7. Remove the mouthpiece from your mouth and breathe out normally. 8. Rest for a few seconds and repeat Steps 1 through 7 at least 10 times every 1-2 hours when you are awake. Take your time and take a few normal breaths between deep breaths. 9. The spirometer may include an indicator to show your best effort. Use the indicator as a goal to work toward during each  repetition. 10. After each set of 10 deep breaths, practice coughing to be sure your lungs are clear. If you have an incision (the cut made at the time of surgery), support your incision when coughing by placing a pillow or rolled up towels firmly against it. Once you are able to get out of bed, walk around indoors and cough well. You may stop using the incentive spirometer when instructed by your caregiver.  RISKS AND COMPLICATIONS  Take your time so you do not get dizzy or light-headed.  If you are in pain, you may need to take or ask for pain medication before doing incentive spirometry. It is harder to take a deep breath if you are having pain. AFTER USE  Rest and breathe slowly and easily.  It can be helpful to keep track of a log of your progress. Your caregiver can provide you with a simple table to help with this. If you are using the spirometer at home, follow these  instructions: SEEK MEDICAL CARE IF:   You are having difficultly using the spirometer.  You have trouble using the spirometer as often as instructed.  Your pain medication is not giving enough relief while using the spirometer.  You develop fever of 100.5 F (38.1 C) or higher. SEEK IMMEDIATE MEDICAL CARE IF:   You cough up bloody sputum that had not been present before.  You develop fever of 102 F (38.9 C) or greater.  You develop worsening pain at or near the incision site. MAKE SURE YOU:   Understand these instructions.  Will watch your condition.  Will get help right away if you are not doing well or get worse. Document Released: 10/30/2006 Document Revised: 09/11/2011 Document Reviewed: 12/31/2006 ExitCare Patient Information 2014 ExitCare, [place].   ________________________________________________________________________  WHAT IS A BLOOD TRANSFUSION? Blood Transfusion Information  A transfusion is the replacement of blood or some of its parts. Blood is made up of multiple cells which provide  different functions.  Red blood cells carry oxygen and are used for blood loss replacement.  White blood cells fight against infection.  Platelets control bleeding.  Plasma helps clot blood.  Other blood products are available for specialized needs, such as hemophilia or other clotting disorders. BEFORE THE TRANSFUSION  Who gives blood for transfusions?   Healthy volunteers who are fully evaluated to make sure their blood is safe. This is blood bank blood. Transfusion therapy is the safest it has ever been in the practice of medicine. Before blood is taken from a donor, a complete history is taken to make sure that person has no history of diseases nor engages in risky social behavior (examples are intravenous drug use or sexual activity with multiple partners). The donor's travel history is screened to minimize risk of transmitting infections, such as malaria. The donated blood is tested for signs of infectious diseases, such as HIV and hepatitis. The blood is then tested to be sure it is compatible with you in order to minimize the chance of a transfusion reaction. If you or a relative donates blood, this is often done in anticipation of surgery and is not appropriate for emergency situations. It takes many days to process the donated blood. RISKS AND COMPLICATIONS Although transfusion therapy is very safe and saves many lives, the main dangers of transfusion include:   Getting an infectious disease.  Developing a transfusion reaction. This is an allergic reaction to something in the blood you were given. Every precaution is taken to prevent this. The decision to have a blood transfusion has been considered carefully by your caregiver before blood is given. Blood is not given unless the benefits outweigh the risks. AFTER THE TRANSFUSION  Right after receiving a blood transfusion, you will usually feel much better and more energetic. This is especially true if your red blood cells have  gotten low (anemic). The transfusion raises the level of the red blood cells which carry oxygen, and this usually causes an energy increase.  The nurse administering the transfusion will monitor you carefully for complications. HOME CARE INSTRUCTIONS  No special instructions are needed after a transfusion. You may find your energy is better. Speak with your caregiver about any limitations on activity for underlying diseases you may have. SEEK MEDICAL CARE IF:   Your condition is not improving after your transfusion.  You develop redness or irritation at the intravenous (IV) site. SEEK IMMEDIATE MEDICAL CARE IF:  Any of the following symptoms occur over the next 12 hours:  Shaking chills.  You have a temperature by mouth above 102 F (38.9 C), not controlled by medicine.  Chest, back, or muscle pain.  People around you feel you are not acting correctly or are confused.  Shortness of breath or difficulty breathing.  Dizziness and fainting.  You get a rash or develop hives.  You have a decrease in urine output.  Your urine turns a dark color or changes to pink, red, or brown. Any of the following symptoms occur over the next 10 days:  You have a temperature by mouth above 102 F (38.9 C), not controlled by medicine.  Shortness of breath.  Weakness after normal activity.  The white part of the eye turns yellow (jaundice).  You have a decrease in the amount of urine or are urinating less often.  Your urine turns a dark color or changes to pink, red, or brown. Document Released: 06/16/2000 Document Revised: 09/11/2011 Document Reviewed: 02/03/2008 ExitCare Patient Information 2014 ExitCare, [place].  __ WHAT IS A BLOOD TRANSFUSION? Blood Transfusion Information  A transfusion is the replacement of blood or some of its parts. Blood is made up of multiple cells which provide different functions.  Red blood cells carry oxygen and are used for blood loss replacement.  White  blood cells fight against infection.  Platelets control bleeding.  Plasma helps clot blood.  Other blood products are available for specialized needs, such as hemophilia or other clotting disorders. BEFORE THE TRANSFUSION  Who gives blood for transfusions?   Healthy volunteers who are fully evaluated to make sure their blood is safe. This is blood bank blood. Transfusion therapy is the safest it has ever been in the practice of medicine. Before blood is taken from a donor, a complete history is taken to make sure that person has no history of diseases nor engages in risky social behavior (examples are intravenous drug use or sexual activity with multiple partners). The donor's travel history is screened to minimize risk of transmitting infections, such as malaria. The donated blood is tested for signs of infectious diseases, such as HIV and hepatitis. The blood is then tested to be sure it is compatible with you in order to minimize the chance of a transfusion reaction. If you or a relative donates blood, this is often done in anticipation of surgery and is not appropriate for emergency situations. It takes many days to process the donated blood. RISKS AND COMPLICATIONS Although transfusion therapy is very safe and saves many lives, the main dangers of transfusion include:   Getting an infectious disease.  Developing a transfusion reaction. This is an allergic reaction to something in the blood you were given. Every precaution is taken to prevent this. The decision to have a blood transfusion has been considered carefully by your caregiver before blood is given. Blood is not given unless the benefits outweigh the risks. AFTER THE TRANSFUSION  Right after receiving a blood transfusion, you will usually feel much better and more energetic. This is especially true if your red blood cells have gotten low (anemic). The transfusion raises the level of the red blood cells which carry oxygen, and this  usually causes an energy increase.  The nurse administering the transfusion will monitor you carefully for complications. HOME CARE INSTRUCTIONS  No special instructions are needed after a transfusion. You may find your energy is better. Speak with your caregiver about any limitations on activity for underlying diseases you may have. SEEK MEDICAL CARE IF:   Your condition is  not improving after your transfusion.  You develop redness or irritation at the intravenous (IV) site. SEEK IMMEDIATE MEDICAL CARE IF:  Any of the following symptoms occur over the next 12 hours:  Shaking chills.  You have a temperature by mouth above 102 F (38.9 C), not controlled by medicine.  Chest, back, or muscle pain.  People around you feel you are not acting correctly or are confused.  Shortness of breath or difficulty breathing.  Dizziness and fainting.  You get a rash or develop hives.  You have a decrease in urine output.  Your urine turns a dark color or changes to pink, red, or brown. Any of the following symptoms occur over the next 10 days:  You have a temperature by mouth above 102 F (38.9 C), not controlled by medicine.  Shortness of breath.  Weakness after normal activity.  The white part of the eye turns yellow (jaundice).  You have a decrease in the amount of urine or are urinating less often.  Your urine turns a dark color or changes to pink, red, or brown. Document Released: 06/16/2000 Document Revised: 09/11/2011 Document Reviewed: 02/03/2008 [facility] Patient Information 2014 [place], [place].  ____________________________________________________________________________________________________________________________________________

## 2020-11-26 ENCOUNTER — Encounter (HOSPITAL_COMMUNITY)
Admission: RE | Admit: 2020-11-26 | Discharge: 2020-11-26 | Disposition: A | Discharge status: Home / Self Care | Payer: PPO | Source: Ambulatory Visit | Attending: Urology | Admitting: Urology

## 2020-11-26 ENCOUNTER — Other Ambulatory Visit: Payer: Self-pay

## 2020-11-26 ENCOUNTER — Encounter (HOSPITAL_COMMUNITY): Payer: Self-pay

## 2020-11-26 DIAGNOSIS — Z01812 Encounter for preprocedural laboratory examination: Secondary | ICD-10-CM | POA: Diagnosis not present

## 2020-11-26 LAB — GLUCOSE, CAPILLARY: Glucose-Capillary: 119 mg/dL — ABNORMAL HIGH (ref 70–99)

## 2020-11-26 LAB — CBC
HCT: 36.7 % (ref 36.0–46.0)
Hemoglobin: 12.1 g/dL (ref 12.0–15.0)
MCH: 28.7 pg (ref 26.0–34.0)
MCHC: 33 g/dL (ref 30.0–36.0)
MCV: 87 fL (ref 80.0–100.0)
Platelets: 238 10*3/uL (ref 150–400)
RBC: 4.22 MIL/uL (ref 3.87–5.11)
RDW: 14 % (ref 11.5–15.5)
WBC: 5.9 10*3/uL (ref 4.0–10.5)
nRBC: 0 % (ref 0.0–0.2)

## 2020-11-26 LAB — BASIC METABOLIC PANEL
Anion gap: 7 (ref 5–15)
BUN: 8 mg/dL (ref 8–23)
CO2: 26 mmol/L (ref 22–32)
Calcium: 9.5 mg/dL (ref 8.9–10.3)
Chloride: 105 mmol/L (ref 98–111)
Creatinine, Ser: 0.75 mg/dL (ref 0.44–1.00)
GFR, Estimated: >60 mL/min (ref >60)
Glucose, Bld: 119 mg/dL — ABNORMAL HIGH (ref 70–99)
Potassium: 3.8 mmol/L (ref 3.5–5.1)
Sodium: 138 mmol/L (ref 135–145)

## 2020-11-26 NOTE — Progress Notes (Signed)
COVID Vaccine Completed:Yes Date COVID Vaccine completed:08/06/19-booster 05/19/21 COVID vaccine manufacturer:   Moderna     PCP - Donney Dice DO Cardiologist -no   Chest x-ray - no EKG - 09/04/20-epic Stress Test - no ECHO - no Cardiac Cath - no Pacemaker/ICD device last checked:NA  Sleep Study - no CPAP -   Fasting Blood Sugar - pt doesn't test Checks Blood Sugar _____ times a day  Blood Thinner Instructions:NA Aspirin Instructions: Last Dose:  Anesthesia review:   Patient denies shortness of breath, fever, cough and chest pain at PAT appointment Yes Pt can climb 2 flights of stairs,do housework and ADLs with out any SOB. 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  Patient verbalized understanding of instructions that were given to them at the PAT appointment. Patient was also instructed that they will need to review over the PAT instructions again at home before surgery.Yes

## 2020-11-26 NOTE — H&P (Addendum)
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 Pathology did confirm a high-grade urothelial carcinoma without evidence of obvious invasion. She does have expected stent symptoms postoperatively. Currently, she denies any hematuria but does have expected urgency and frequency symptoms. She returns today to review her pathology results and discuss appropriate treatment options. Under initial evaluation in the emergency department on 09/04/2020, she did undergo a complete CT scan of the chest, abdomen, and pelvis. Her chest scan did demonstrate a 4 mm nonspecific pulmonary nodule at the left lung base. No other abnormalities were identified to suggest metastatic or regionally advanced disease. She is a nonsmoker although did briefly smoke prior to quitting in the 1980s. Her renal function is normal with her most recent serum creatinine being 0.72. Her hemoglobin was 11.6. Liver function tests were normal.     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   Apr: Right ureteroscopy and biopsy -    NON-GU PMH: Arthritis Diabetes Type 2 Gout Hypercholesterolemia Hypertension    FAMILY HISTORY: 1 Daughter - Runs in Family   SOCIAL HISTORY: Marital Status: Widowed Ethnicity: Not  Hispanic Or Latino; Race: Black or African American Current Smoking Status: Patient does not smoke anymore.   Tobacco Use Assessment Completed: Used Tobacco in last 30 days? Has never drank.  Drinks 3 caffeinated drinks per day.    REVIEW OF SYSTEMS:    GU Review Female:   Patient denies frequent urination, hard to postpone urination, burning /pain with urination, get up at night to urinate, leakage of urine, stream starts and stops, trouble starting your stream, have to strain to urinate, and currently pregnant.  Gastrointestinal (Lower):   Patient denies diarrhea and constipation.  Gastrointestinal (Upper):   Patient denies nausea and vomiting.  Constitutional:   Patient denies fever, night sweats, weight loss, and fatigue.  Skin:   Patient denies skin rash/ lesion and itching.  Eyes:   Patient denies blurred vision and double vision.  Ears/ Nose/ Throat:   Patient denies sore throat and sinus problems.  Hematologic/Lymphatic:   Patient denies swollen glands and easy bruising.  Cardiovascular:   Patient denies leg swelling and chest pains.  Respiratory:   Patient denies cough and shortness of breath.  Endocrine:   Patient denies excessive thirst.  Musculoskeletal:   Patient denies back pain and joint pain.  Neurological:   Patient denies headaches and dizziness.  Psychologic:   Patient denies depression and anxiety.   VITAL SIGNS:     Weight 159 lb / 72.12 kg  Height 60 in / 152.4 cm  BMI 31.0 kg/m   MULTI-SYSTEM PHYSICAL EXAMINATION:    Constitutional: Well-nourished. No physical deformities. Normally developed. Good grooming.  Neck: Neck symmetrical, not  swollen. Normal tracheal position.  Respiratory: No labored breathing, no use of accessory muscles. Clear bilaterally.  Cardiovascular: Normal temperature, normal extremity pulses, no swelling, no varicosities. Regular rate and rhythm.  Lymphatic: No enlargement of neck, axillae, groin.  Skin: No paleness, no jaundice, no  cyanosis. No lesion, no ulcer, no rash.  Neurologic / Psychiatric: Oriented to time, oriented to place, oriented to person. No depression, no anxiety, no agitation.  Gastrointestinal: Obese, soft, nontender condom no abdominal masses.  Eyes: Normal conjunctivae. Normal eyelids.  Ears, Nose, Mouth, and Throat: Left ear no scars, no lesions, no masses. Right ear no scars, no lesions, no masses. Nose no scars, no lesions, no masses. Normal hearing. Normal lips.  Musculoskeletal: Normal gait and station of head and neck.     Complexity of Data:  Lab Test Review:   CMP  Records Review:   Pathology Reports, Previous Patient Records  X-Ray Review: C.T. Chest/ Abd/Pelvis: Reviewed Films.    Notes:                     CLINICAL DATA: Abdominal pain. Aortic dissection suspected. High  blood pressure. Also consider pyelonephritis, stones,  diverticulitis, cholecystitis, and pancreatitis.   EXAM:  CT ANGIOGRAPHY CHEST, ABDOMEN AND PELVIS   TECHNIQUE:  Non-contrast CT of the chest was initially obtained.   Multidetector CT imaging through the chest, abdomen and pelvis was  performed using the standard protocol during bolus administration of  intravenous contrast. Multiplanar reconstructed images and MIPs were  obtained and reviewed to evaluate the vascular anatomy.   CONTRAST: 44mL OMNIPAQUE IOHEXOL 350 MG/ML SOLN   COMPARISON: CT scan of the abdomen and pelvis without contrast  June 27, 2018   FINDINGS:  CTA CHEST FINDINGS   Cardiovascular: Calcified atherosclerosis is seen in the thoracic  aorta without aneurysm. No thoracic aortic dissection. The heart  size is unremarkable. Calcified atherosclerosis is seen in the right  coronary artery. There may be minimal calcified atherosclerosis in  the proximal left LAD as seen on series 5, image 27. The study was  not tailored to evaluate the pulmonary arteries. However, the  pulmonary arteries are well opacified and there are no pulmonary   emboli identified.   Mediastinum/Nodes: No effusions. Surgical clips seen in the left  axilla. The chest wall is unremarkable. The thyroid is normal. No  adenopathy in the chest. There is a small hiatal hernia. The  esophagus is otherwise unremarkable.   Lungs/Pleura: The central airways are normal. No pneumothorax. There  is a 4 mm nodule in the left base on series 8, image 46. No other  pulmonary nodules or masses. No suspicious infiltrates to suggest  pneumonia or aspiration.   Musculoskeletal: Degenerative changes in the thoracic spine. No  other bony abnormalities.   Review of the MIP images confirms the above findings.   CTA ABDOMEN AND PELVIS FINDINGS   VASCULAR   Aorta: No aneurysm in the abdominal aorta. No dissection.  Atherosclerotic changes are seen.   Celiac: Atherosclerotic changes are seen in the origin of the celiac  artery without significant stenosis.   SMA: Patent without evidence of aneurysm, dissection, vasculitis or  significant stenosis.   Renals: Both renal arteries are patent without evidence of aneurysm,  dissection, vasculitis, fibromuscular dysplasia or significant  stenosis.   IMA: Patent without evidence of aneurysm, dissection, vasculitis or  significant stenosis.   Inflow: Patent without evidence of aneurysm, dissection, vasculitis  or significant stenosis.   Veins: No  obvious venous abnormality within the limitations of this  arterial phase study.   Review of the MIP images confirms the above findings.   NON-VASCULAR   Hepatobiliary: No focal liver abnormality is seen. No gallstones,  gallbladder wall thickening, or biliary dilatation.   Pancreas: Unremarkable. No pancreatic ductal dilatation or  surrounding inflammatory changes.   Spleen: Normal in size without focal abnormality.   Adrenals/Urinary Tract: Adrenal glands are normal. There is a cyst  in the lower pole the left kidney. Evaluation for stones is limited  due to  contrast in the renal collecting systems. A few small stones  are not excluded on the left. No hydronephrosis or perinephric  stranding on the left. The left ureter is normal in appearance.  There is mild hydronephrosis on the right which is a new finding. No  perinephric stranding. There is increased attenuation in the mildly  prominent right renal pelvis, extending into the proximal right  ureter and a left-sided indibulin as seen on axial image 108. The  right ureter is nondilated. The bladder is unremarkable.   Stomach/Bowel: There is a small hiatal hernia. The stomach and small  bowel are normal. Colonic diverticulosis is identified. There is fat  stranding adjacent to the distal descending colon near its junction  with the sigmoid colon consistent with diverticulitis. No evidence  of perforation. The remainder of the colon is normal. The appendix  is normal.   Lymphatic: No adenopathy identified.   Reproductive: Uterus and bilateral adnexa are unremarkable.   Other: No free air. There is fluid in the pelvis, likely secondary  to the diverticulitis described above. No abscess.   Musculoskeletal: Sclerosis in the right hip is unchanged since 2019,  likely a bone island. Degenerative changes seen in the lumbar spine.   Review of the MIP images confirms the above findings.   IMPRESSION:  1. Increased attenuation in the right renal pelvis extending into  the proximal right ureter and a right-sided infundibulum worrisome  for neoplasm/malignancy such as a transitional cell carcinoma. The  findings are not completely specific. Recommend urologic  consultation.  2. Diverticulitis in the distal descending colon, likely explaining  the patient's symptoms.  3. Atherosclerotic change in the nonaneurysmal aorta. No dissection.  4. Free fluid in the pelvis is likely secondary to the  diverticulitis.  5. No other acute abnormalities.    Electronically Signed  By: Dorise Bullion III  M.D  On: 09/04/2020 10:30      ASSESSMENT:      ICD-10 Details  1 GU:   Renal pelvis cancer, right - C65.1   2 NON-GU:   Bacteriuria - R82.71    PLAN:       1. High-grade urothelial carcinoma of the right renal pelvis: I reviewed her pathology report with her and her friend today. She has undergone a complete metastatic evaluation without suggestion of metastatic disease. She does have a 4 mm pulmonary nodule that will require ongoing follow-up. Considering these findings, I did recommend proceeding with a right robot assisted laparoscopic nephroureterectomy. We reviewed this procedure in detail including the potential risks which include but are not limited to bleeding, infection, risks of general anesthesia, heart attack, stroke, DVT, risk of damage to adjacent organ structures, renal function deterioration, risk of recurrence, need for long-term surveillance, and risk of open surgical conversion. We discussed the expected postoperative recovery process including her postoperative hospitalization, need for a postoperative urethral catheter for a few days. We also discussed proceeding with postoperative intravesical  instillation of gemcitabine chemotherapy into the bladder to prevent bladder recurrence.   All of her questions have been answered to her stated satisfaction. She will be scheduled for a right robot assisted laparoscopic nephroureterectomy a and postoperative intravesical instillation of gemcitabine.

## 2020-11-27 LAB — HEMOGLOBIN A1C
Hgb A1c MFr Bld: 7.1 % — ABNORMAL HIGH (ref 4.8–5.6)
Mean Plasma Glucose: 157 mg/dL

## 2020-12-03 NOTE — Progress Notes (Signed)
TCT patient who states she went to covid testing center yesterday however test results are not documented.  Patient to arrive at 8:30 am Monday morning for Covid test prior to surgery.

## 2020-12-05 ENCOUNTER — Encounter (HOSPITAL_COMMUNITY): Payer: Self-pay | Admitting: Urology

## 2020-12-06 ENCOUNTER — Encounter (HOSPITAL_COMMUNITY): Payer: Self-pay | Admitting: Urology

## 2020-12-06 ENCOUNTER — Other Ambulatory Visit: Payer: Self-pay

## 2020-12-06 ENCOUNTER — Other Ambulatory Visit (HOSPITAL_COMMUNITY): Payer: Self-pay

## 2020-12-06 ENCOUNTER — Inpatient Hospital Stay (HOSPITAL_COMMUNITY): Payer: PPO | Admitting: Anesthesiology

## 2020-12-06 ENCOUNTER — Encounter (HOSPITAL_COMMUNITY)
Admission: RE | Disposition: A | Discharge status: Home Health | Payer: Self-pay | Source: Home / Self Care | Attending: Urology

## 2020-12-06 ENCOUNTER — Inpatient Hospital Stay (HOSPITAL_COMMUNITY)
Admission: RE | Admit: 2020-12-06 | Discharge: 2020-12-09 | DRG: 658 | Disposition: A | Discharge status: Home Health | Payer: PPO | Attending: Urology | Admitting: Urology

## 2020-12-06 DIAGNOSIS — C651 Malignant neoplasm of right renal pelvis: Secondary | ICD-10-CM | POA: Diagnosis not present

## 2020-12-06 DIAGNOSIS — E669 Obesity, unspecified: Secondary | ICD-10-CM | POA: Diagnosis present

## 2020-12-06 DIAGNOSIS — E119 Type 2 diabetes mellitus without complications: Secondary | ICD-10-CM | POA: Diagnosis present

## 2020-12-06 DIAGNOSIS — Z87891 Personal history of nicotine dependence: Secondary | ICD-10-CM | POA: Diagnosis not present

## 2020-12-06 DIAGNOSIS — Z79899 Other long term (current) drug therapy: Secondary | ICD-10-CM

## 2020-12-06 DIAGNOSIS — E11618 Type 2 diabetes mellitus with other diabetic arthropathy: Secondary | ICD-10-CM

## 2020-12-06 DIAGNOSIS — Z20822 Contact with and (suspected) exposure to covid-19: Secondary | ICD-10-CM | POA: Diagnosis present

## 2020-12-06 DIAGNOSIS — Z6831 Body mass index (BMI) 31.0-31.9, adult: Secondary | ICD-10-CM | POA: Diagnosis not present

## 2020-12-06 DIAGNOSIS — I10 Essential (primary) hypertension: Secondary | ICD-10-CM | POA: Diagnosis not present

## 2020-12-06 DIAGNOSIS — Z7984 Long term (current) use of oral hypoglycemic drugs: Secondary | ICD-10-CM | POA: Diagnosis not present

## 2020-12-06 DIAGNOSIS — C641 Malignant neoplasm of right kidney, except renal pelvis: Secondary | ICD-10-CM | POA: Diagnosis present

## 2020-12-06 DIAGNOSIS — E785 Hyperlipidemia, unspecified: Secondary | ICD-10-CM | POA: Diagnosis not present

## 2020-12-06 HISTORY — PX: ROBOT ASSITED LAPAROSCOPIC NEPHROURETERECTOMY: SHX6077

## 2020-12-06 LAB — GLUCOSE, CAPILLARY
Glucose-Capillary: 138 mg/dL — ABNORMAL HIGH (ref 70–99)
Glucose-Capillary: 156 mg/dL — ABNORMAL HIGH (ref 70–99)
Glucose-Capillary: 147 mg/dL — ABNORMAL HIGH (ref 70–99)

## 2020-12-06 LAB — BASIC METABOLIC PANEL
Anion gap: 8 (ref 5–15)
BUN: 7 mg/dL — ABNORMAL LOW (ref 8–23)
CO2: 25 mmol/L (ref 22–32)
Calcium: 9.1 mg/dL (ref 8.9–10.3)
Chloride: 105 mmol/L (ref 98–111)
Creatinine, Ser: 0.97 mg/dL (ref 0.44–1.00)
GFR, Estimated: >60 mL/min (ref >60)
Glucose, Bld: 160 mg/dL — ABNORMAL HIGH (ref 70–99)
Potassium: 3.8 mmol/L (ref 3.5–5.1)
Sodium: 138 mmol/L (ref 135–145)

## 2020-12-06 LAB — HEMOGLOBIN AND HEMATOCRIT, BLOOD
HCT: 38 % (ref 36.0–46.0)
Hemoglobin: 12.1 g/dL (ref 12.0–15.0)

## 2020-12-06 LAB — TYPE AND SCREEN
ABO/RH(D): A POS
Antibody Screen: NEGATIVE

## 2020-12-06 LAB — SARS CORONAVIRUS 2 BY RT PCR (HOSPITAL ORDER, PERFORMED IN ~~LOC~~ HOSPITAL LAB): SARS Coronavirus 2: NEGATIVE

## 2020-12-06 LAB — ABO/RH: ABO/RH(D): A POS

## 2020-12-06 SURGERY — NEPHROURETERECTOMY, ROBOT-ASSISTED, LAPAROSCOPIC
Anesthesia: General | Laterality: Right

## 2020-12-06 MED ORDER — ORAL CARE MOUTH RINSE: 15.0000 mL | Freq: Once | OROMUCOSAL | Status: AC

## 2020-12-06 MED ORDER — ACETAMINOPHEN 325 MG PO TABS: 325.0000 mg | ORAL_TABLET | ORAL | Status: DC | PRN

## 2020-12-06 MED ORDER — CEFAZOLIN SODIUM-DEXTROSE 2-4 GM/100ML-% IV SOLN
2.0000 g | Freq: Once | INTRAVENOUS | Status: AC
  Administered 2020-12-06: 2 g via INTRAVENOUS
  Filled 2020-12-06: qty 100

## 2020-12-06 MED ORDER — LIDOCAINE 2% (20 MG/ML) 5 ML SYRINGE
INTRAMUSCULAR | Status: DC | PRN
  Administered 2020-12-06: 50 mg via INTRAVENOUS

## 2020-12-06 MED ORDER — ONDANSETRON HCL 4 MG/2ML IJ SOLN
INTRAMUSCULAR | Status: DC | PRN
  Administered 2020-12-06: 4 mg via INTRAVENOUS

## 2020-12-06 MED ORDER — CARVEDILOL 25 MG PO TABS
25.0000 mg | ORAL_TABLET | Freq: Two times a day (BID) | ORAL | Status: DC
  Administered 2020-12-06 – 2020-12-09 (×5): 25 mg via ORAL
  Filled 2020-12-06 (×5): qty 1

## 2020-12-06 MED ORDER — MIDAZOLAM HCL 5 MG/5ML IJ SOLN
INTRAMUSCULAR | Status: DC | PRN
  Administered 2020-12-06: 1 mg via INTRAVENOUS

## 2020-12-06 MED ORDER — INSULIN ASPART 100 UNIT/ML IJ SOLN
0.0000 [IU] | INTRAMUSCULAR | Status: DC
  Administered 2020-12-06: 2 [IU] via SUBCUTANEOUS
  Administered 2020-12-07 (×2): 3 [IU] via SUBCUTANEOUS
  Administered 2020-12-07: 2 [IU] via SUBCUTANEOUS
  Administered 2020-12-07 – 2020-12-08 (×2): 3 [IU] via SUBCUTANEOUS
  Administered 2020-12-09: 2 [IU] via SUBCUTANEOUS

## 2020-12-06 MED ORDER — OXYCODONE HCL 5 MG PO TABS: 5.0000 mg | ORAL_TABLET | Freq: Once | ORAL | Status: DC | PRN

## 2020-12-06 MED ORDER — MIDAZOLAM HCL 2 MG/2ML IJ SOLN
INTRAMUSCULAR | Status: AC
  Filled 2020-12-06: qty 2

## 2020-12-06 MED ORDER — INDIGOTINDISULFONATE SODIUM 8 MG/ML IJ SOLN
INTRAMUSCULAR | Status: AC
  Filled 2020-12-06: qty 5

## 2020-12-06 MED ORDER — ACETAMINOPHEN 10 MG/ML IV SOLN
1000.0000 mg | Freq: Once | INTRAVENOUS | Status: DC | PRN
  Administered 2020-12-06: 1000 mg via INTRAVENOUS

## 2020-12-06 MED ORDER — ROCURONIUM BROMIDE 10 MG/ML (PF) SYRINGE
PREFILLED_SYRINGE | INTRAVENOUS | Status: DC | PRN
  Administered 2020-12-06: 10 mg via INTRAVENOUS
  Administered 2020-12-06: 50 mg via INTRAVENOUS
  Administered 2020-12-06: 20 mg via INTRAVENOUS

## 2020-12-06 MED ORDER — AMISULPRIDE (ANTIEMETIC) 5 MG/2ML IV SOLN: 10.0000 mg | Freq: Once | INTRAVENOUS | Status: DC | PRN

## 2020-12-06 MED ORDER — LACTATED RINGERS IR SOLN
Status: DC | PRN
  Administered 2020-12-06: 1000 mL

## 2020-12-06 MED ORDER — FENTANYL CITRATE (PF) 100 MCG/2ML IJ SOLN
INTRAMUSCULAR | Status: AC
  Filled 2020-12-06: qty 2

## 2020-12-06 MED ORDER — EPHEDRINE SULFATE-NACL 50-0.9 MG/10ML-% IV SOSY
PREFILLED_SYRINGE | INTRAVENOUS | Status: DC | PRN
  Administered 2020-12-06 (×2): 5 mg via INTRAVENOUS
  Administered 2020-12-06: 10 mg via INTRAVENOUS

## 2020-12-06 MED ORDER — ACETAMINOPHEN 10 MG/ML IV SOLN
INTRAVENOUS | Status: AC
  Filled 2020-12-06: qty 100

## 2020-12-06 MED ORDER — CHLORHEXIDINE GLUCONATE 0.12 % MT SOLN
15.0000 mL | Freq: Once | OROMUCOSAL | Status: AC
  Administered 2020-12-06: 15 mL via OROMUCOSAL

## 2020-12-06 MED ORDER — DOCUSATE SODIUM 100 MG PO CAPS: 100.0000 mg | ORAL_CAPSULE | Freq: Two times a day (BID) | ORAL | Status: DC

## 2020-12-06 MED ORDER — LACTATED RINGERS IV SOLN: INTRAVENOUS | Status: DC

## 2020-12-06 MED ORDER — AMLODIPINE BESYLATE 5 MG PO TABS
5.0000 mg | ORAL_TABLET | Freq: Every day | ORAL | Status: DC
  Administered 2020-12-08 – 2020-12-09 (×2): 5 mg via ORAL
  Filled 2020-12-06 (×2): qty 1

## 2020-12-06 MED ORDER — ONDANSETRON HCL 4 MG/2ML IJ SOLN
4.0000 mg | INTRAMUSCULAR | Status: DC | PRN
  Administered 2020-12-07: 4 mg via INTRAVENOUS
  Filled 2020-12-06: qty 2

## 2020-12-06 MED ORDER — FENTANYL CITRATE (PF) 250 MCG/5ML IJ SOLN
INTRAMUSCULAR | Status: AC
  Filled 2020-12-06: qty 5

## 2020-12-06 MED ORDER — ACETAMINOPHEN 10 MG/ML IV SOLN
1000.0000 mg | Freq: Four times a day (QID) | INTRAVENOUS | Status: AC
  Administered 2020-12-07 (×2): 1000 mg via INTRAVENOUS
  Filled 2020-12-06 (×3): qty 100

## 2020-12-06 MED ORDER — ACETAMINOPHEN 160 MG/5ML PO SOLN: 325.0000 mg | ORAL | Status: DC | PRN

## 2020-12-06 MED ORDER — DEXAMETHASONE SODIUM PHOSPHATE 10 MG/ML IJ SOLN
INTRAMUSCULAR | Status: DC | PRN
  Administered 2020-12-06: 5 mg via INTRAVENOUS

## 2020-12-06 MED ORDER — CEFAZOLIN SODIUM-DEXTROSE 1-4 GM/50ML-% IV SOLN
1.0000 g | Freq: Three times a day (TID) | INTRAVENOUS | Status: AC
  Administered 2020-12-06 – 2020-12-07 (×2): 1 g via INTRAVENOUS
  Filled 2020-12-06 (×3): qty 50

## 2020-12-06 MED ORDER — OXYCODONE HCL 5 MG/5ML PO SOLN: 5.0000 mg | Freq: Once | ORAL | Status: DC | PRN

## 2020-12-06 MED ORDER — DIPHENHYDRAMINE HCL 50 MG/ML IJ SOLN: 12.5000 mg | Freq: Four times a day (QID) | INTRAMUSCULAR | Status: DC | PRN

## 2020-12-06 MED ORDER — SODIUM CHLORIDE (PF) 0.9 % IJ SOLN
INTRAMUSCULAR | Status: DC | PRN
  Administered 2020-12-06: 20 mL

## 2020-12-06 MED ORDER — BUPIVACAINE LIPOSOME 1.3 % IJ SUSP
20.0000 mL | Freq: Once | INTRAMUSCULAR | Status: AC
  Administered 2020-12-06: 20 mL
  Filled 2020-12-06: qty 20

## 2020-12-06 MED ORDER — ATORVASTATIN CALCIUM 40 MG PO TABS
40.0000 mg | ORAL_TABLET | Freq: Every day | ORAL | Status: DC
  Administered 2020-12-06 – 2020-12-09 (×3): 40 mg via ORAL
  Filled 2020-12-06 (×3): qty 1

## 2020-12-06 MED ORDER — FENTANYL CITRATE (PF) 100 MCG/2ML IJ SOLN
25.0000 ug | INTRAMUSCULAR | Status: DC | PRN
  Administered 2020-12-06 (×3): 50 ug via INTRAVENOUS

## 2020-12-06 MED ORDER — DIPHENHYDRAMINE HCL 12.5 MG/5ML PO ELIX: 12.5000 mg | ORAL_SOLUTION | Freq: Four times a day (QID) | ORAL | Status: DC | PRN

## 2020-12-06 MED ORDER — DOCUSATE SODIUM 100 MG PO CAPS
100.0000 mg | ORAL_CAPSULE | Freq: Two times a day (BID) | ORAL | Status: DC
  Administered 2020-12-06 – 2020-12-09 (×5): 100 mg via ORAL
  Filled 2020-12-06 (×5): qty 1

## 2020-12-06 MED ORDER — SODIUM CHLORIDE 0.45 % IV SOLN: INTRAVENOUS | Status: DC

## 2020-12-06 MED ORDER — PROPOFOL 10 MG/ML IV BOLUS
INTRAVENOUS | Status: AC
  Filled 2020-12-06: qty 40

## 2020-12-06 MED ORDER — STERILE WATER FOR IRRIGATION IR SOLN
Status: DC | PRN
  Administered 2020-12-06: 1000 mL

## 2020-12-06 MED ORDER — SUGAMMADEX SODIUM 200 MG/2ML IV SOLN
INTRAVENOUS | Status: DC | PRN
  Administered 2020-12-06: 100 mg via INTRAVENOUS
  Administered 2020-12-06: 200 mg via INTRAVENOUS

## 2020-12-06 MED ORDER — HYDROMORPHONE HCL 1 MG/ML IJ SOLN
0.5000 mg | INTRAMUSCULAR | Status: DC | PRN
  Administered 2020-12-06 – 2020-12-07 (×3): 1 mg via INTRAVENOUS
  Filled 2020-12-06 (×3): qty 1

## 2020-12-06 MED ORDER — GEMCITABINE CHEMO FOR BLADDER INSTILLATION 2000 MG
2000.0000 mg | Freq: Once | INTRAVENOUS | Status: AC
Start: 2020-12-06 — End: 2020-12-06
  Administered 2020-12-06: 2000 mg via INTRAVESICAL
  Filled 2020-12-06: qty 2000

## 2020-12-06 MED ORDER — FENTANYL CITRATE (PF) 100 MCG/2ML IJ SOLN
INTRAMUSCULAR | Status: DC | PRN
  Administered 2020-12-06 (×2): 50 ug via INTRAVENOUS
  Administered 2020-12-06: 100 ug via INTRAVENOUS

## 2020-12-06 MED ORDER — PROMETHAZINE HCL 25 MG/ML IJ SOLN
6.2500 mg | INTRAMUSCULAR | Status: DC | PRN
Start: 2020-12-06 — End: 2020-12-06

## 2020-12-06 MED ORDER — TRAMADOL HCL 50 MG PO TABS
50.0000 mg | ORAL_TABLET | Freq: Four times a day (QID) | ORAL | 0 refills | Status: DC | PRN
  Filled 2020-12-06: qty 20, 3d supply, fill #0

## 2020-12-06 MED ORDER — PROPOFOL 10 MG/ML IV BOLUS
INTRAVENOUS | Status: DC | PRN
  Administered 2020-12-06: 130 mg via INTRAVENOUS

## 2020-12-06 MED ORDER — SODIUM CHLORIDE (PF) 0.9 % IJ SOLN
INTRAMUSCULAR | Status: AC
  Filled 2020-12-06: qty 20

## 2020-12-06 MED ORDER — LACTATED RINGERS IV SOLN: INTRAVENOUS | Status: DC | PRN

## 2020-12-06 MED ORDER — MAGNESIUM CITRATE PO SOLN
0.5000 | Freq: Once | ORAL | Status: DC
  Filled 2020-12-06: qty 296

## 2020-12-06 SURGICAL SUPPLY — 74 items
ADH SKN CLS APL DERMABOND .7 (GAUZE/BANDAGES/DRESSINGS) ×1
APL PRP STRL LF DISP 70% ISPRP (MISCELLANEOUS) ×1
BAG LAPAROSCOPIC 12 15 PORT 16 (BASKET) ×1 IMPLANT
BAG RETRIEVAL 12/15 (BASKET) ×4
BAG RETRIEVAL 12/15MM (BASKET) ×2
CATH FOLEY 3WAY  5CC 18FR (CATHETERS) ×3
CATH FOLEY 3WAY 5CC 18FR (CATHETERS) ×1 IMPLANT
CHLORAPREP W/TINT 26 (MISCELLANEOUS) ×3 IMPLANT
CLIP VESOLOCK LG 6/CT PURPLE (CLIP) ×7 IMPLANT
CLIP VESOLOCK MED LG 6/CT (CLIP) ×3 IMPLANT
CLIP VESOLOCK XL 6/CT (CLIP) ×2 IMPLANT
COVER SURGICAL LIGHT HANDLE (MISCELLANEOUS) ×3 IMPLANT
COVER TIP SHEARS 8 DVNC (MISCELLANEOUS) ×1 IMPLANT
COVER TIP SHEARS 8MM DA VINCI (MISCELLANEOUS) ×6
COVER WAND RF STERILE (DRAPES) IMPLANT
CUTTER ECHEON FLEX ENDO 45 340 (ENDOMECHANICALS) ×2 IMPLANT
CUTTER FLEX LINEAR 45M (STAPLE) IMPLANT
DECANTER SPIKE VIAL GLASS SM (MISCELLANEOUS) ×3 IMPLANT
DERMABOND ADVANCED (GAUZE/BANDAGES/DRESSINGS) ×2
DERMABOND ADVANCED .7 DNX12 (GAUZE/BANDAGES/DRESSINGS) ×2 IMPLANT
DRAIN CHANNEL 15F RND FF 3/16 (WOUND CARE) ×3 IMPLANT
DRAPE ARM DVNC X/XI (DISPOSABLE) ×4 IMPLANT
DRAPE COLUMN DVNC XI (DISPOSABLE) ×1 IMPLANT
DRAPE DA VINCI XI ARM (DISPOSABLE) ×12
DRAPE DA VINCI XI COLUMN (DISPOSABLE) ×3
DRAPE INCISE IOBAN 66X45 STRL (DRAPES) ×3 IMPLANT
DRAPE LAPAROSCOPIC ABDOMINAL (DRAPES) ×3 IMPLANT
DRAPE SHEET LG 3/4 BI-LAMINATE (DRAPES) ×3 IMPLANT
DRSG TEGADERM 4X4.75 (GAUZE/BANDAGES/DRESSINGS) ×5 IMPLANT
ELECT PENCIL ROCKER SW 15FT (MISCELLANEOUS) ×3 IMPLANT
ELECT REM PT RETURN 15FT ADLT (MISCELLANEOUS) ×3 IMPLANT
EVACUATOR SILICONE 100CC (DRAIN) ×3 IMPLANT
GLOVE SURG ENC MOIS LTX SZ6.5 (GLOVE) ×3 IMPLANT
GLOVE SURG ENC TEXT LTX SZ7.5 (GLOVE) ×6 IMPLANT
GOWN STRL REUS W/TWL LRG LVL3 (GOWN DISPOSABLE) ×9 IMPLANT
IRRIG SUCT STRYKERFLOW 2 WTIP (MISCELLANEOUS)
IRRIGATION SUCT STRKRFLW 2 WTP (MISCELLANEOUS) IMPLANT
KIT BASIN OR (CUSTOM PROCEDURE TRAY) ×3 IMPLANT
KIT TURNOVER KIT A (KITS) ×3 IMPLANT
NDL SPNL 18GX3.5 QUINCKE PK (NEEDLE) IMPLANT
NEEDLE SPNL 18GX3.5 QUINCKE PK (NEEDLE) ×3 IMPLANT
NS IRRIG 1000ML POUR BTL (IV SOLUTION) ×3 IMPLANT
PENCIL SMOKE EVACUATOR (MISCELLANEOUS) IMPLANT
PLUG CATH AND CAP STER (CATHETERS) ×3 IMPLANT
PROTECTOR NERVE ULNAR (MISCELLANEOUS) ×6 IMPLANT
RELOAD 45 VASCULAR/THIN (ENDOMECHANICALS) IMPLANT
RELOAD STAPLE 45 2.5 WHT GRN (ENDOMECHANICALS) IMPLANT
RELOAD STAPLE 45 2.6 WHT THIN (STAPLE) IMPLANT
SEAL CANN UNIV 5-8 DVNC XI (MISCELLANEOUS) ×5 IMPLANT
SEAL XI 5MM-8MM UNIVERSAL (MISCELLANEOUS) ×15
SET IRRIG Y TYPE TUR BLADDER L (SET/KITS/TRAYS/PACK) ×3 IMPLANT
SET TUBE SMOKE EVAC HIGH FLOW (TUBING) ×3 IMPLANT
SOLUTION ELECTROLUBE (MISCELLANEOUS) ×3 IMPLANT
STAPLE RELOAD 45 WHT (STAPLE) ×1 IMPLANT
STAPLE RELOAD 45MM WHITE (STAPLE) ×3
SUT ETHILON 3 0 PS 1 (SUTURE) ×3 IMPLANT
SUT MNCRL AB 4-0 PS2 18 (SUTURE) ×6 IMPLANT
SUT PDS AB 1 CTX 36 (SUTURE) ×6 IMPLANT
SUT V-LOC BARB 180 2/0GR6 GS22 (SUTURE)
SUT VIC AB 0 CT1 27 (SUTURE) ×3
SUT VIC AB 0 CT1 27XBRD ANTBC (SUTURE) ×1 IMPLANT
SUT VIC AB 2-0 CT1 27 (SUTURE) ×3
SUT VIC AB 2-0 CT1 27XBRD (SUTURE) IMPLANT
SUT VICRYL 0 UR6 27IN ABS (SUTURE) ×3 IMPLANT
SUT VLOC BARB 180 ABS3/0GR12 (SUTURE) ×6
SUTURE V-LC BRB 180 2/0GR6GS22 (SUTURE) IMPLANT
SUTURE VLOC BRB 180 ABS3/0GR12 (SUTURE) ×1 IMPLANT
TOWEL OR NON WOVEN STRL DISP B (DISPOSABLE) ×3 IMPLANT
TRAY FOLEY MTR SLVR 14FR STAT (SET/KITS/TRAYS/PACK) ×2 IMPLANT
TRAY FOLEY MTR SLVR 16FR STAT (SET/KITS/TRAYS/PACK) ×1 IMPLANT
TRAY LAPAROSCOPIC (CUSTOM PROCEDURE TRAY) ×3 IMPLANT
TROCAR 12M 150ML BLUNT (TROCAR) ×2 IMPLANT
TROCAR XCEL 12X100 BLDLESS (ENDOMECHANICALS) ×3 IMPLANT
WATER STERILE IRR 1000ML POUR (IV SOLUTION) ×6 IMPLANT

## 2020-12-06 NOTE — Op Note (Signed)
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 Right robotic-assisted laparoscopic nephroureterectomy 2. Postoperative intravesical instillation of gemcitabine  Surgeon: Pryor Curia. M.D.   Assistant(s): Debbrah Alar, PA-C  .An assistant was required for this surgical procedure.  The duties of the assistant included but were not limited to suctioning, passing suture, camera manipulation, retraction. This procedure would not be able to be performed without an Environmental consultant.  Resident: Dr. Ermelinda Das  Anesthesia: General   Complications: None  EBL: 100  IVF: 1500 mL crystalloid   Specimens:  1. Right kidney, ureter, and bladder cuff  Disposition of specimens: Pathology   Drains:  1. # 15 Blake pelvic drain 2. 18 Fr Foley catheter  Indication:   Ashley Sloan is a 70 y.o. patient with upper tract urothelial carcinoma of the right renal pelvis. After a thorough review of the management options, she elected to proceed with surgical treatment and the above procedure. We have discussed the potential benefits and risks of the procedure, side effects of the proposed treatment, the likelihood of the patient achieving the goals of the procedure, and any potential problems that might occur during the procedure or recuperation. Informed consent has been obtained.   Description of procedure:   The patient was taken to the operating room and a general anesthetic was administered. The patient was given preoperative antibiotics, placed in the right modified flank position with care to pad all potential pressure points, and prepped and draped in the usual sterile fashion. Next a preoperative timeout was performed.   A site was selected in the upper midline for placement of the 12 mm camera port. This was placed using a standard open Hassan technique which allowed entry into  the peritoneal cavity under direct vision and without difficulty. A 12 mm port was placed and a pneumoperitoneum established. The camera was then used to inspect the abdomen and there was no evidence of any intra-abdominal injuries or other abnormalities. The remaining abdominal ports were then placed. 8 mm robotic ports were placed in the right upper quadrant, right lower quadrant, and far right lateral abdominal wall. An 8 mm camera port was placed in the right abdominal wall. All ports were placed under direct vision without difficulty.   Utilizing the cautery scissors, the white line of Toldt was incised allowing the colon to be mobilized medially and the plane between the mesocolon and the anterior layer of Gerota's fascia to be developed and the kidney to be exposed. The ureter and gonadal vein were identified inferiorly and the ureter was lifted anteriorly off the psoas muscle. Dissection proceeded superiorly along the gonadal vein until it entered the inferior vena cava. The gonadal vein was divided after ligation with multiple Weck clips. The renal hilum was then encountered. There was a branching renal artery and a single renal vein.  Gerota's fascia was then intentionally entered superiorly to spare the adrenal gland and the hepatorenal ligaments were divided. The lateral attachments to the kidney were then divided allowing the kidney to be freely mobile. Dissection then proceeded inferiorly along the ureter toward the pelvis. The gonadal vein had been ligated superiorly at its insertion into the IVC and was again ligated with Weck clips distally and divided. The ureter was dissected free down the the common iliac vessels.   At this point attention returned to the renal fossa. Hemostasis was ensured.  Attention then focused on the  pelvic dissection. The robotic cart was undocked. An additional 8 mm robotic port was placed in the lower midline and the cart was redocked over the patient's hip.  There  were small bowel adhesions to the bladder that were dissected free from the bladder. The ureter was further dissected freely into the pelvis after the overlying peritoneum was incised and the small vessels feeding the ureter were ligated with bipolar energy. A Weck clip was placed on the ureter distally to avoid any risk of tumor spillage. The detrusor muscle fibers were divided and the mucosa could be visualized. A 3-0 V-lock suture was secured at the lateral side of the margin of resection in the bladder. The mucosa was then entered and the ureter and a surrounding bladder cuff were removed. The cystotomy was then closed with the v-lock suture in a running fashion with a second imbricating layer as well. The bladder was then filled with saline and there was no evidence for urine leak.   A # 15 Blake drain was then brought through the lateral lower port site and positioned in the perivesical space. The specimen was placed in an Endocatch II bag and later removed through the upper midline 12 mm port site which was slightly extended. This extraction site was closed with two running # 1 PDS fascial sutures. The subcutaneous layer was closed with a running 2-0 vicryl suture. All other laparoscopic/robotic ports were removed under direct vision and the pneumoperitoneum let down with inspection of the operative field performed and hemostasis again confirmed. All incision sites were then injected with local anesthetic and reapproximated at the skin level with 4-0 monocryl subcuticular closures. Dermabond was applied to the skin. The patient tolerated the procedure well and without complications. The patient was able to be extubated and transferred to the recovery unit in satisfactory condition.   In the PACU, 2000 mg of gemcitabine was instilled into the bladder intravesically and allowed to dwell for one hour.   Pryor Curia MD

## 2020-12-06 NOTE — Anesthesia Preprocedure Evaluation (Addendum)
Anesthesia Evaluation  Patient identified by MRN, date of birth, ID band Patient awake    Reviewed: Allergy & Precautions, NPO status , Patient's Chart, lab work & pertinent test results  Airway Mallampati: II  TM Distance: >3 FB Neck ROM: Full    Dental  (+) Dental Advisory Given, Edentulous Upper   Pulmonary former smoker,    breath sounds clear to auscultation       Cardiovascular hypertension,  Rhythm:Regular Rate:Normal     Neuro/Psych negative neurological ROS  negative psych ROS   GI/Hepatic negative GI ROS, Neg liver ROS,   Endo/Other  diabetes  Renal/GU negative Renal ROS     Musculoskeletal  (+) Arthritis ,   Abdominal Normal abdominal exam  (+)   Peds  Hematology negative hematology ROS (+)   Anesthesia Other Findings - HLD  Reproductive/Obstetrics                           Anesthesia Physical Anesthesia Plan  ASA: II  Anesthesia Plan: General   Post-op Pain Management:    Induction: Intravenous  PONV Risk Score and Plan: 4 or greater and Ondansetron, Dexamethasone and Treatment may vary due to age or medical condition  Airway Management Planned: Oral ETT  Additional Equipment: None  Intra-op Plan:   Post-operative Plan: Extubation in OR  Informed Consent: I have reviewed the patients History and Physical, chart, labs and discussed the procedure including the risks, benefits and alternatives for the proposed anesthesia with the patient or authorized representative who has indicated his/her understanding and acceptance.     Dental advisory given  Plan Discussed with: CRNA  Anesthesia Plan Comments:        Anesthesia Quick Evaluation

## 2020-12-06 NOTE — Discharge Instructions (Signed)

## 2020-12-06 NOTE — Anesthesia Procedure Notes (Signed)
Procedure Name: Intubation Date/Time: 12/06/2020 11:15 AM Performed by: Lavina Hamman, CRNA Pre-anesthesia Checklist: Patient identified, Emergency Drugs available, Suction available, Patient being monitored and Timeout performed Patient Re-evaluated:Patient Re-evaluated prior to induction Oxygen Delivery Method: Circle system utilized Preoxygenation: Pre-oxygenation with 100% oxygen Induction Type: IV induction Ventilation: Oral airway inserted - appropriate to patient size and Mask ventilation without difficulty Laryngoscope Size: Mac and 3 Grade View: Grade II Tube type: Oral Tube size: 7.0 mm Number of attempts: 1 Airway Equipment and Method: Patient positioned with wedge pillow,  Stylet and Oral airway Placement Confirmation: ETT inserted through vocal cords under direct vision,  positive ETCO2,  CO2 detector and breath sounds checked- equal and bilateral Secured at: 22 cm Dental Injury: Teeth and Oropharynx as per pre-operative assessment

## 2020-12-06 NOTE — Progress Notes (Signed)
   12/06/20 1834  Vitals  Temp (!) 97.4 F (36.3 C)  BP (!) 146/69  MAP (mmHg) 92  BP Location Left Arm  BP Method Automatic  Patient Position (if appropriate) Lying  Pulse Rate 62  Resp 16  MEWS COLOR  MEWS Score Color Green  Oxygen Therapy  SpO2 100 %  O2 Device Nasal Cannula  O2 Flow Rate (L/min) 2 L/min  Pain Assessment  Pain Score 7  Pain Type Surgical pain  Pain Location Abdomen  Patient arrived to the unit at 1834. Patient alert and oriented x 4 . RR even and unlabored on 2L of nasal canula. Daughter at bedside. Right JP drain dressing, clean, dry and intact, along with 4 dermabond sites. Foley attached to left leg strap, hanging below the bladder, clean, dry and intact.  Endorsing pain 7/10, but orders needs to be verified by pharmacy. Will make oncoming nurse aware. Admission documentation initiated. No other needs identified.

## 2020-12-06 NOTE — Transfer of Care (Signed)
Immediate Anesthesia Transfer of Care Note  Patient: Ashley Sloan  Procedure(s) Performed: XI ROBOT ASSITED LAPAROSCOPIC NEPHROURETERECTOMY WITH POST OPERTIVE INSTILLATION OF INTRAVESICAL GEMCITABINE (Right )  Patient Location: PACU  Anesthesia Type:General  Level of Consciousness: sedated  Airway & Oxygen Therapy: Patient Spontanous Breathing and Patient connected to face mask oxygen  Post-op Assessment: Report given to RN and Post -op Vital signs reviewed and stable  Post vital signs: Reviewed and stable  Last Vitals:  Vitals Value Taken Time  BP    Temp    Pulse 65 12/06/20 1516  Resp 12 12/06/20 1516  SpO2 100 % 12/06/20 1516  Vitals shown include unvalidated device data.  Last Pain:  Vitals:   12/06/20 1021  TempSrc: Oral  PainSc:          Complications: No complications documented.

## 2020-12-07 ENCOUNTER — Other Ambulatory Visit (HOSPITAL_COMMUNITY): Payer: Self-pay

## 2020-12-07 ENCOUNTER — Encounter (HOSPITAL_COMMUNITY): Payer: Self-pay | Admitting: Urology

## 2020-12-07 LAB — GLUCOSE, CAPILLARY
Glucose-Capillary: 101 mg/dL — ABNORMAL HIGH (ref 70–99)
Glucose-Capillary: 130 mg/dL — ABNORMAL HIGH (ref 70–99)
Glucose-Capillary: 134 mg/dL — ABNORMAL HIGH (ref 70–99)
Glucose-Capillary: 140 mg/dL — ABNORMAL HIGH (ref 70–99)
Glucose-Capillary: 162 mg/dL — ABNORMAL HIGH (ref 70–99)
Glucose-Capillary: 176 mg/dL — ABNORMAL HIGH (ref 70–99)
Glucose-Capillary: 183 mg/dL — ABNORMAL HIGH (ref 70–99)

## 2020-12-07 LAB — BASIC METABOLIC PANEL
Anion gap: 10 (ref 5–15)
BUN: 11 mg/dL (ref 8–23)
CO2: 24 mmol/L (ref 22–32)
Calcium: 8.9 mg/dL (ref 8.9–10.3)
Chloride: 97 mmol/L — ABNORMAL LOW (ref 98–111)
Creatinine, Ser: 1.04 mg/dL — ABNORMAL HIGH (ref 0.44–1.00)
GFR, Estimated: 57 mL/min — ABNORMAL LOW (ref >60)
Glucose, Bld: 143 mg/dL — ABNORMAL HIGH (ref 70–99)
Potassium: 3.9 mmol/L (ref 3.5–5.1)
Sodium: 131 mmol/L — ABNORMAL LOW (ref 135–145)

## 2020-12-07 LAB — HEMOGLOBIN AND HEMATOCRIT, BLOOD
HCT: 36.4 % (ref 36.0–46.0)
Hemoglobin: 11.7 g/dL — ABNORMAL LOW (ref 12.0–15.0)

## 2020-12-07 LAB — CREATININE, FLUID (PLEURAL, PERITONEAL, JP DRAINAGE): Creat, Fluid: 1 mg/dL

## 2020-12-07 MED ORDER — TRAMADOL HCL 50 MG PO TABS
50.0000 mg | ORAL_TABLET | Freq: Four times a day (QID) | ORAL | Status: DC | PRN
  Administered 2020-12-07: 50 mg via ORAL
  Administered 2020-12-07 – 2020-12-09 (×5): 100 mg via ORAL
  Filled 2020-12-07: qty 2
  Filled 2020-12-07: qty 1
  Filled 2020-12-07 (×4): qty 2

## 2020-12-07 MED ORDER — CHLORHEXIDINE GLUCONATE CLOTH 2 % EX PADS
6.0000 | MEDICATED_PAD | Freq: Every day | CUTANEOUS | Status: DC
  Administered 2020-12-07 – 2020-12-08 (×2): 6 via TOPICAL

## 2020-12-07 MED ORDER — BISACODYL 10 MG RE SUPP
10.0000 mg | Freq: Once | RECTAL | Status: AC
  Administered 2020-12-07: 10 mg via RECTAL
  Filled 2020-12-07: qty 1

## 2020-12-07 NOTE — Progress Notes (Signed)
Patient ID: Ashley Sloan, female   DOB: 10-03-1950, 70 y.o.   MRN: [id number]  1 Day Post-Op Subjective: Pt has not ambulated yet.  Pain controlled but very sore in abdomen.  No nausea or vomiting.  Tolerating clears.  No flatus.  Pt still hooked up to telemetry for unclear reasons.  Objective: Vital signs in last 24 hours: Temp:  [95.3 F (35.2 C)-98.4 F (36.9 C)] 97.7 F (36.5 C) (06/07 0515) Pulse Rate:  [50-65] 64 (06/07 0515) Resp:  [7-19] 18 (06/07 0515) BP: (111-170)/(57-84) 159/84 (06/07 0515) SpO2:  [96 %-100 %] 100 % (06/07 0515)  Intake/Output from previous day: 06/06 0701 - 06/07 0700 In: 1493.7 [P.O.:200; I.V.:1233.7; IV Piggyback:50] Out: 1005 [Urine:850; Drains:55; Blood:100] Intake/Output this shift: No intake/output data recorded.  Physical Exam:  General: Alert and oriented CV: RRR Lungs: Clear Abdomen: Soft, ND, active bowel sounds, drain with minimal output Incisions: C/D/I Ext: NT, No erythema  Lab Results: Recent Labs    12/06/20 1522 12/07/20 0442  HGB 12.1 11.7*  HCT 38.0 36.4   BMET Recent Labs    12/06/20 1522 12/07/20 0442  NA 138 131*  K 3.8 3.9  CL 105 97*  CO2 25 24  GLUCOSE 160* 143*  BUN 7* 11  CREATININE 0.97 1.04*  CALCIUM 9.1 8.9     Studies/Results: No results found.  Assessment/Plan: POD # 1 s/p right RAL nephroureterectomy - Ambulate, IS, DVT prophylaxis - Will confirm no cardiac concerns overnight and plan to d/c telemetry - Advance diet as tolerated - Check drain Cr - Oral pain medication - Since patient did not ambulate last evening, do not anticipate discharge today.  Likely tomorrow.  Will begin catheter teaching.   LOS: 1 day   Dutch Gray 12/07/2020, 7:28 AM

## 2020-12-07 NOTE — Anesthesia Postprocedure Evaluation (Signed)
Anesthesia Post Note  Patient: Ashley Sloan  Procedure(s) Performed: XI ROBOT ASSITED LAPAROSCOPIC NEPHROURETERECTOMY WITH POST OPERTIVE INSTILLATION OF INTRAVESICAL GEMCITABINE (Right )     Patient location during evaluation: PACU Anesthesia Type: General Level of consciousness: awake and alert Pain management: pain level controlled Vital Signs Assessment: post-procedure vital signs reviewed and stable Respiratory status: spontaneous breathing, nonlabored ventilation, respiratory function stable and patient connected to nasal cannula oxygen Cardiovascular status: blood pressure returned to baseline and stable Postop Assessment: no apparent nausea or vomiting Anesthetic complications: no   No complications documented.  Last Vitals:  Vitals:   12/06/20 1834 12/07/20 0515  BP: (!) 146/69 (!) 159/84  Pulse: 62 64  Resp: 16 18  Temp: (!) 36.3 C 36.5 C  SpO2: 100% 100%          Effie Berkshire

## 2020-12-08 LAB — GLUCOSE, CAPILLARY
Glucose-Capillary: 108 mg/dL — ABNORMAL HIGH (ref 70–99)
Glucose-Capillary: 109 mg/dL — ABNORMAL HIGH (ref 70–99)
Glucose-Capillary: 111 mg/dL — ABNORMAL HIGH (ref 70–99)
Glucose-Capillary: 114 mg/dL — ABNORMAL HIGH (ref 70–99)
Glucose-Capillary: 116 mg/dL — ABNORMAL HIGH (ref 70–99)
Glucose-Capillary: 172 mg/dL — ABNORMAL HIGH (ref 70–99)

## 2020-12-08 LAB — BASIC METABOLIC PANEL
Anion gap: 7 (ref 5–15)
BUN: 12 mg/dL (ref 8–23)
CO2: 25 mmol/L (ref 22–32)
Calcium: 8.6 mg/dL — ABNORMAL LOW (ref 8.9–10.3)
Chloride: 101 mmol/L (ref 98–111)
Creatinine, Ser: 1.13 mg/dL — ABNORMAL HIGH (ref 0.44–1.00)
GFR, Estimated: 52 mL/min — ABNORMAL LOW (ref >60)
Glucose, Bld: 117 mg/dL — ABNORMAL HIGH (ref 70–99)
Potassium: 3.7 mmol/L (ref 3.5–5.1)
Sodium: 133 mmol/L — ABNORMAL LOW (ref 135–145)

## 2020-12-08 MED ORDER — BISACODYL 10 MG RE SUPP
10.0000 mg | Freq: Once | RECTAL | Status: AC
  Administered 2020-12-08: 10 mg via RECTAL
  Filled 2020-12-08: qty 1

## 2020-12-08 MED ORDER — METOCLOPRAMIDE HCL 5 MG/ML IJ SOLN
5.0000 mg | Freq: Four times a day (QID) | INTRAMUSCULAR | Status: DC
  Administered 2020-12-08 – 2020-12-09 (×4): 5 mg via INTRAVENOUS
  Filled 2020-12-08 (×4): qty 2

## 2020-12-08 NOTE — Progress Notes (Signed)
Patient ID: Ashley Sloan, female   DOB: 28-Jul-1950, 70 y.o.   MRN: [id number]  Pt ambulated once but had difficulty.  Still awaiting PT eval.  Having BMs and flatus.  Abd: slightly more distended in upper abdomen Inc: C/D/I  Plan: - PT eval - Will check CBC in AM - If not making progress and still with significant abdominal pain, will consider imaging although do not have high suspicion of intra-abdominal complications at this time considering exam and bowel function that appears to be progressing

## 2020-12-08 NOTE — Progress Notes (Addendum)
Patient ID: Ashley Sloan, female   DOB: June 29, 1951, 70 y.o.   MRN: [id number]  2 Days Post-Op Subjective: Pt with worse abdominal pain this morning after improvement yesterday.  She has not yet ambulated.  She did have emesis x 1 yesterday morning but has otherwise tolerated her diet.  BM this morning.  Objective: Vital signs in last 24 hours: Temp:  [97.7 F (36.5 C)-98.3 F (36.8 C)] 98.3 F (36.8 C) (06/08 0412) Pulse Rate:  [61-65] 65 (06/08 0412) Resp:  [14-18] 16 (06/08 0412) BP: (126-168)/(61-70) 147/63 (06/08 0412) SpO2:  [98 %-100 %] 98 % (06/08 0412)  Intake/Output from previous day: 06/07 0701 - 06/08 0700 In: 2484.9 [P.O.:480; I.V.:2004.9] Out: 1536 [Urine:1525; Emesis/NG output:1; Drains:10] Intake/Output this shift: No intake/output data recorded.  Physical Exam:  General: Alert and oriented CV: RRR Lungs: Clear Abdomen: Mild distention, normal bowel sounds, tender throughout abdomen without rebound tenderness and no port site tenderness Incisions: C/D/I Ext: NT, No erythema  Lab Results: Recent Labs    12/06/20 1522 12/07/20 0442  HGB 12.1 11.7*  HCT 38.0 36.4   BMET Recent Labs    12/07/20 0442 12/08/20 0513  NA 131* 133*  K 3.9 3.7  CL 97* 101  CO2 24 25  GLUCOSE 143* 117*  BUN 11 12  CREATININE 1.04* 1.13*  CALCIUM 8.9 8.6*     Studies/Results: Drain Cr 1.0  Assessment/Plan: POD # 2 s/p right RAL nephroureterectomy - Ambulate, IS, DVT prophylaxis, will get PT consult today - Abdominal pain likely related to bowel distention based on exam, will start Reglan and will consider imaging if not improving.  Will continue IV fluids for now. - Remove drain - If significantly improved later today, will consider discharge possibly   LOS: 2 days   Ashley Sloan 12/08/2020, 7:05 AM

## 2020-12-08 NOTE — Evaluation (Signed)
Physical Therapy Evaluation Patient Details Name: Ashley Sloan MRN: [id number] DOB: 20-May-1951 Today's Date: 12/08/2020   History of Present Illness  70 yo female admitted with urothelial carcinoma s/p nephroureterectomy 6/6  Clinical Impression  On eval, pt required Min A for mobility. She walked ~120 feet with a RW. Moderate pain with activity. Will plan to follow and progress activity as tolerated. Pt is agreeable to HHPT f/u. She stated she will have help from family after discharge.     Follow Up Recommendations Home health PT;Supervision for mobility/OOB    Equipment Recommendations  Rolling Walker    Recommendations for Other Services       Precautions / Restrictions Precautions Precautions: Fall Restrictions Weight Bearing Restrictions: No      Mobility  Bed Mobility Overal bed mobility: Needs Assistance Bed Mobility: Rolling;Sit to Sidelying;Sidelying to Sit Rolling: Min guard Sidelying to sit: Min assist     Sit to sidelying: Min assist General bed mobility comments: Assist for trunk and LEs. Increased time. Cues for safety, technique (logroll)    Transfers Overall transfer level: Needs assistance Equipment used: Rolling walker (2 wheeled) Transfers: Sit to/from Stand Sit to Stand: Min assist         General transfer comment: Assist to rise, steady, control descent. Cues for safety, hand placement. Increased time  Ambulation/Gait Ambulation/Gait assistance: Min guard Gait Distance (Feet): 120 Feet Assistive device: Rolling walker (2 wheeled) Gait Pattern/deviations: Step-through pattern;Decreased stride length     General Gait Details: Min guard for safety. Slow gait speed.  Stairs            Wheelchair Mobility    Modified Rankin (Stroke Patients Only)       Balance Overall balance assessment: Needs assistance         Standing balance support: Bilateral upper extremity supported Standing balance-Leahy Scale: Fair                                Pertinent Vitals/Pain Pain Assessment: 0-10 Pain Score: 6  Pain Location: abdomen Pain Descriptors / Indicators: Sore;Discomfort Pain Intervention(s): Monitored during session    Home Living Family/patient expects to be discharged to:: Private residence Living Arrangements: Alone Available Help at Discharge: Family;Available PRN/intermittently (daugher, grandson) Type of Home: House Home Access: Stairs to enter Entrance Stairs-Rails: Right Entrance Stairs-Number of Steps: 4 Home Layout: One level Home Equipment: Bedside commode      Prior Function Level of Independence: Independent               Hand Dominance        Extremity/Trunk Assessment   Upper Extremity Assessment Upper Extremity Assessment: Overall WFL for tasks assessed    Lower Extremity Assessment Lower Extremity Assessment: Generalized weakness    Cervical / Trunk Assessment Cervical / Trunk Assessment: Normal  Communication   Communication: No difficulties  Cognition Arousal/Alertness: Awake/alert Behavior During Therapy: WFL for tasks assessed/performed Overall Cognitive Status: Within Functional Limits for tasks assessed                                        General Comments      Exercises     Assessment/Plan    PT Assessment Patient needs continued PT services  PT Problem List Decreased strength;Decreased mobility;Decreased activity tolerance;Decreased balance;Decreased knowledge of use of DME;Pain  PT Treatment Interventions DME instruction;Gait training;Therapeutic exercise;Balance training;Functional mobility training;Therapeutic activities;Patient/family education    PT Goals (Current goals can be found in the Care Plan section)  Acute Rehab PT Goals Patient Stated Goal: less pain PT Goal Formulation: With patient Time For Goal Achievement: 12/22/20 Potential to Achieve Goals: Good    Frequency Min 3X/week   Barriers  to discharge        Co-evaluation               AM-PAC PT "6 Clicks" Mobility  Outcome Measure Help needed turning from your back to your side while in a flat bed without using bedrails?: A Little Help needed moving from lying on your back to sitting on the side of a flat bed without using bedrails?: A Little Help needed moving to and from a bed to a chair (including a wheelchair)?: A Little Help needed standing up from a chair using your arms (e.g., wheelchair or bedside chair)?: A Little Help needed to walk in hospital room?: A Little Help needed climbing 3-5 steps with a railing? : A Little 6 Click Score: 18    End of Session   Activity Tolerance: Patient tolerated treatment well Patient left: in bed;with call bell/phone within reach;with family/visitor present;with bed alarm set   PT Visit Diagnosis: Pain;Other abnormalities of gait and mobility (R26.89)    Time: 2620-3559 PT Time Calculation (min) (ACUTE ONLY): 19 min   Charges:   PT Evaluation $PT Eval Moderate Complexity: [place], PT Acute Rehabilitation  Office: [phone] Pager: [phone]

## 2020-12-09 ENCOUNTER — Other Ambulatory Visit (HOSPITAL_COMMUNITY): Payer: Self-pay

## 2020-12-09 LAB — COMPREHENSIVE METABOLIC PANEL
ALT: 14 U/L (ref 0–44)
AST: 20 U/L (ref 15–41)
Albumin: 3.4 g/dL — ABNORMAL LOW (ref 3.5–5.0)
Alkaline Phosphatase: 92 U/L (ref 38–126)
Anion gap: 8 (ref 5–15)
BUN: 11 mg/dL (ref 8–23)
CO2: 25 mmol/L (ref 22–32)
Calcium: 8.9 mg/dL (ref 8.9–10.3)
Chloride: 99 mmol/L (ref 98–111)
Creatinine, Ser: 1.09 mg/dL — ABNORMAL HIGH (ref 0.44–1.00)
GFR, Estimated: 54 mL/min — ABNORMAL LOW (ref >60)
Glucose, Bld: 130 mg/dL — ABNORMAL HIGH (ref 70–99)
Potassium: 3.7 mmol/L (ref 3.5–5.1)
Sodium: 132 mmol/L — ABNORMAL LOW (ref 135–145)
Total Bilirubin: 2.3 mg/dL — ABNORMAL HIGH (ref 0.3–1.2)
Total Protein: 6.8 g/dL (ref 6.5–8.1)

## 2020-12-09 LAB — GLUCOSE, CAPILLARY
Glucose-Capillary: 119 mg/dL — ABNORMAL HIGH (ref 70–99)
Glucose-Capillary: 128 mg/dL — ABNORMAL HIGH (ref 70–99)

## 2020-12-09 LAB — CBC
HCT: 34.3 % — ABNORMAL LOW (ref 36.0–46.0)
Hemoglobin: 11.2 g/dL — ABNORMAL LOW (ref 12.0–15.0)
MCH: 28.5 pg (ref 26.0–34.0)
MCHC: 32.7 g/dL (ref 30.0–36.0)
MCV: 87.3 fL (ref 80.0–100.0)
Platelets: 199 10*3/uL (ref 150–400)
RBC: 3.93 MIL/uL (ref 3.87–5.11)
RDW: 13.7 % (ref 11.5–15.5)
WBC: 11.4 10*3/uL — ABNORMAL HIGH (ref 4.0–10.5)
nRBC: 0 % (ref 0.0–0.2)

## 2020-12-09 MED ORDER — METFORMIN HCL 1000 MG PO TABS: ORAL_TABLET | ORAL | 11 refills | Status: DC

## 2020-12-09 MED ORDER — SULFAMETHOXAZOLE-TRIMETHOPRIM 800-160 MG PO TABS
1.0000 | ORAL_TABLET | Freq: Two times a day (BID) | ORAL | 0 refills | Status: DC
  Filled 2020-12-09: qty 4, 2d supply, fill #0

## 2020-12-09 NOTE — Plan of Care (Signed)

## 2020-12-09 NOTE — Care Management Important Message (Signed)
Important Message  Patient Details IM Letter given to the Patient. Name: Ashley Sloan MRN: [id number] Date of Birth: 01-31-1951   Medicare Important Message Given:  Yes     Kerin Salen 12/09/2020, 10:32 AM

## 2020-12-09 NOTE — Discharge Summary (Signed)
  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 Briefly, Ashley Sloan is a 70 y.o. 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   Hospital Course: She underwent an right RAL nephroureterectomy on 12/06/20 with postoperative intravesical gemcitabine instillation.  She was able to begin ambulating with the help of physical therapy.  Her diet was gradually advanced as her bowel function returned.  Her renal function remained stable and her drain Cr was consistent with serum and her drain was removed.  She was stable for discharge on POD # 3 with her catheter.  Laboratory values:  Recent Labs    12/06/20 1522 12/07/20 0442 12/09/20 0502  HGB 12.1 11.7* 11.2*  HCT 38.0 36.4 34.3*   Recent Labs    12/08/20 0513 12/09/20 0502  CREATININE 1.13* 1.09*    Disposition: Home  Discharge instruction: The patient was instructed to be ambulatory but told to refrain from heavy lifting, strenuous activity, or driving.   Discharge medications:  Allergies as of 12/09/2020   No Known Allergies      Medication List     STOP taking these medications    ondansetron 4 MG tablet Commonly known as: ZOFRAN   phenazopyridine 200 MG tablet Commonly known as: Pyridium       TAKE these medications    acetaminophen 500 MG tablet Commonly known as: TYLENOL Take 1,000 mg by mouth every 6 (six) hours as needed for mild pain.   amLODipine 5 MG tablet Commonly known as: NORVASC TAKE 1 TABLET BY MOUTH DAILY   atorvastatin 40 MG tablet Commonly known as: LIPITOR TAKE 1 TABLET(40 MG) BY MOUTH DAILY What changed: See the new instructions.   carvedilol 25 MG tablet Commonly known  as: COREG Take 1 tablet (25 mg total) by mouth 2 (two) times daily with a meal.   docusate sodium 100 MG capsule Commonly known as: COLACE Take 1 capsule (100 mg total) by mouth 2 (two) times daily.   metFORMIN 1000 MG tablet Commonly known as: GLUCOPHAGE TAKE 1 TABLET(1000 MG) BY MOUTH TWICE DAILY WITH A MEAL What changed: See the new instructions.   sulfamethoxazole-trimethoprim 800-160 MG tablet Commonly known as: BACTRIM DS Take 1 tablet by mouth 2 (two) times daily. Begin the day before your appointment next week   traMADol 50 MG tablet Commonly known as: Ultram Take 1 to 2 tablets (50-100 mg total) by mouth every 6 (six) hours as needed for moderate pain or severe pain.        Followup:   Follow-up Information     Raynelle Bring, MD On 12/14/2020.   Specialty: Urology Why: at 10:00 Contact information: [address] [place] [id number] [phone]

## 2020-12-09 NOTE — Progress Notes (Signed)
Patient ID: Ashley Sloan, female   DOB: [birth year], 70 y.o.   MRN: [id number]  3 Days Post-Op Subjective: Pt doing well.  Ambulating better.  HHPT recommended per PT.  Passing flatus.  Objective: Vital signs in last 24 hours: Temp:  [98.8 F (37.1 C)-99.7 F (37.6 C)] 99.1 F (37.3 C) (06/09 0351) Pulse Rate:  [71-74] 74 (06/09 0351) Resp:  [15-19] 15 (06/09 0351) BP: (139-159)/(62-75) 159/69 (06/09 0351) SpO2:  [96 %-98 %] 98 % (06/09 0351)  Intake/Output from previous day: 06/08 0701 - 06/09 0700 In: 1297.1 [P.O.:360; I.V.:937.1] Out: 1735 [Urine:1675; Drains:60] Intake/Output this shift: No intake/output data recorded.  Physical Exam:  General: Alert and oriented Abd: Soft, ND Inc: C/D/I  Lab Results: Recent Labs    12/06/20 1522 12/07/20 0442 12/09/20 0502  HGB 12.1 11.7* 11.2*  HCT 38.0 36.4 34.3*   BMET Recent Labs    12/08/20 0513 12/09/20 0502  NA 133* 132*  K 3.7 3.7  CL 101 99  CO2 25 25  GLUCOSE 117* 130*  BUN 12 11  CREATININE 1.13* 1.09*  CALCIUM 8.6* 8.9     Studies/Results: No results found.  Assessment/Plan: POD # 3 s/p right RAL nephroureterectomy - Path pending - Will d/c home with HHPT per PT recommendations - F/U with cystogram next week   LOS: 3 days   Dutch Gray 12/09/2020, 7:19 AM

## 2020-12-09 NOTE — Progress Notes (Signed)
Patient and her daughter given discharge, follow up, medication, and foley care/foley bag instructions, verbalized understanding, IV x 2 removed, personal belongings with patient, daughter to transport home

## 2020-12-09 NOTE — TOC Transition Note (Signed)
Transition of Care [facility]) - CM/SW Discharge Note   Patient Details  Name: Ashley Sloan MRN: [id number] Date of Birth: [birth year]  [facility] CM/SW Contact:  Dessa Phi, RN Phone Number: 12/09/2020, 9:17 AM   Clinical Narrative: d/c home w/HHPT-[facility] accepted rep cory aware(Sh[place]  dtr had no preference. Already has rw. No further CM needs.      Final next level of care: [place] Barriers to Discharge: No Barriers Identified   Patient Goals and CMS Choice Patient states their goals for this hospitalization and ongoing recovery are:: go home   Choice offered to / list presented to : Adult Children Ivin Booty dtr [phone])  Discharge Placement                       Discharge Plan and Services   Discharge Planning Services: CM Consult                      HH Arranged: PT HH Agency: [place] Date [place]: 12/09/20 Time [place]: [phone] Representative spoke with at [place]: [place] ([place]) Interventions     Readmission Risk Interventions No flowsheet data found.

## 2020-12-12 LAB — SURGICAL PATHOLOGY

## 2020-12-14 DIAGNOSIS — C651 Malignant neoplasm of right renal pelvis: Secondary | ICD-10-CM | POA: Diagnosis not present

## 2020-12-15 ENCOUNTER — Telehealth: Payer: Self-pay

## 2020-12-15 DIAGNOSIS — C641 Malignant neoplasm of right kidney, except renal pelvis: Secondary | ICD-10-CM | POA: Diagnosis not present

## 2020-12-15 DIAGNOSIS — Z7984 Long term (current) use of oral hypoglycemic drugs: Secondary | ICD-10-CM | POA: Diagnosis not present

## 2020-12-15 DIAGNOSIS — E119 Type 2 diabetes mellitus without complications: Secondary | ICD-10-CM | POA: Diagnosis not present

## 2020-12-15 DIAGNOSIS — E7800 Pure hypercholesterolemia, unspecified: Secondary | ICD-10-CM | POA: Diagnosis not present

## 2020-12-15 DIAGNOSIS — I2510 Atherosclerotic heart disease of native coronary artery without angina pectoris: Secondary | ICD-10-CM | POA: Diagnosis not present

## 2020-12-15 DIAGNOSIS — K449 Diaphragmatic hernia without obstruction or gangrene: Secondary | ICD-10-CM | POA: Diagnosis not present

## 2020-12-15 DIAGNOSIS — K5730 Diverticulosis of large intestine without perforation or abscess without bleeding: Secondary | ICD-10-CM | POA: Diagnosis not present

## 2020-12-15 DIAGNOSIS — M109 Gout, unspecified: Secondary | ICD-10-CM | POA: Diagnosis not present

## 2020-12-15 DIAGNOSIS — Z9181 History of falling: Secondary | ICD-10-CM | POA: Diagnosis not present

## 2020-12-15 DIAGNOSIS — Z483 Aftercare following surgery for neoplasm: Secondary | ICD-10-CM | POA: Diagnosis not present

## 2020-12-15 DIAGNOSIS — Z905 Acquired absence of kidney: Secondary | ICD-10-CM | POA: Diagnosis not present

## 2020-12-15 DIAGNOSIS — I10 Essential (primary) hypertension: Secondary | ICD-10-CM | POA: Diagnosis not present

## 2020-12-15 DIAGNOSIS — M170 Bilateral primary osteoarthritis of knee: Secondary | ICD-10-CM | POA: Diagnosis not present

## 2020-12-15 DIAGNOSIS — C679 Malignant neoplasm of bladder, unspecified: Secondary | ICD-10-CM | POA: Diagnosis not present

## 2020-12-15 DIAGNOSIS — I700 Atherosclerosis of aorta: Secondary | ICD-10-CM | POA: Diagnosis not present

## 2020-12-15 DIAGNOSIS — M47816 Spondylosis without myelopathy or radiculopathy, lumbar region: Secondary | ICD-10-CM | POA: Diagnosis not present

## 2020-12-15 DIAGNOSIS — M47814 Spondylosis without myelopathy or radiculopathy, thoracic region: Secondary | ICD-10-CM | POA: Diagnosis not present

## 2020-12-15 NOTE — Telephone Encounter (Signed)
[facility] PT calls nurse line to get verbal orders for [facility] PT as follows.   1 w 1  2 w 3  1 w 1  Verbal orders given per [facility] protocol.

## 2020-12-27 ENCOUNTER — Other Ambulatory Visit (HOSPITAL_COMMUNITY): Payer: PPO

## 2021-01-04 DIAGNOSIS — Z905 Acquired absence of kidney: Secondary | ICD-10-CM | POA: Diagnosis not present

## 2021-01-04 DIAGNOSIS — M47816 Spondylosis without myelopathy or radiculopathy, lumbar region: Secondary | ICD-10-CM | POA: Diagnosis not present

## 2021-01-04 DIAGNOSIS — M109 Gout, unspecified: Secondary | ICD-10-CM | POA: Diagnosis not present

## 2021-01-04 DIAGNOSIS — I2510 Atherosclerotic heart disease of native coronary artery without angina pectoris: Secondary | ICD-10-CM | POA: Diagnosis not present

## 2021-01-04 DIAGNOSIS — E119 Type 2 diabetes mellitus without complications: Secondary | ICD-10-CM | POA: Diagnosis not present

## 2021-01-04 DIAGNOSIS — I700 Atherosclerosis of aorta: Secondary | ICD-10-CM | POA: Diagnosis not present

## 2021-01-04 DIAGNOSIS — E7800 Pure hypercholesterolemia, unspecified: Secondary | ICD-10-CM | POA: Diagnosis not present

## 2021-01-04 DIAGNOSIS — Z9181 History of falling: Secondary | ICD-10-CM | POA: Diagnosis not present

## 2021-01-04 DIAGNOSIS — M47814 Spondylosis without myelopathy or radiculopathy, thoracic region: Secondary | ICD-10-CM | POA: Diagnosis not present

## 2021-01-04 DIAGNOSIS — K5730 Diverticulosis of large intestine without perforation or abscess without bleeding: Secondary | ICD-10-CM | POA: Diagnosis not present

## 2021-01-04 DIAGNOSIS — Z483 Aftercare following surgery for neoplasm: Secondary | ICD-10-CM | POA: Diagnosis not present

## 2021-01-04 DIAGNOSIS — Z7984 Long term (current) use of oral hypoglycemic drugs: Secondary | ICD-10-CM | POA: Diagnosis not present

## 2021-01-04 DIAGNOSIS — C641 Malignant neoplasm of right kidney, except renal pelvis: Secondary | ICD-10-CM | POA: Diagnosis not present

## 2021-01-04 DIAGNOSIS — I10 Essential (primary) hypertension: Secondary | ICD-10-CM | POA: Diagnosis not present

## 2021-01-04 DIAGNOSIS — M170 Bilateral primary osteoarthritis of knee: Secondary | ICD-10-CM | POA: Diagnosis not present

## 2021-01-04 DIAGNOSIS — C679 Malignant neoplasm of bladder, unspecified: Secondary | ICD-10-CM | POA: Diagnosis not present

## 2021-01-04 DIAGNOSIS — K449 Diaphragmatic hernia without obstruction or gangrene: Secondary | ICD-10-CM | POA: Diagnosis not present

## 2021-01-05 DIAGNOSIS — R8271 Bacteriuria: Secondary | ICD-10-CM | POA: Diagnosis not present

## 2021-01-05 DIAGNOSIS — C651 Malignant neoplasm of right renal pelvis: Secondary | ICD-10-CM | POA: Diagnosis not present

## 2021-01-31 ENCOUNTER — Other Ambulatory Visit: Payer: Self-pay | Admitting: Family Medicine

## 2021-01-31 DIAGNOSIS — I10 Essential (primary) hypertension: Secondary | ICD-10-CM

## 2021-03-12 ENCOUNTER — Other Ambulatory Visit: Payer: Self-pay | Admitting: Family Medicine

## 2021-03-12 DIAGNOSIS — I10 Essential (primary) hypertension: Secondary | ICD-10-CM

## 2021-03-21 ENCOUNTER — Encounter: Payer: Self-pay | Admitting: Student

## 2021-03-21 ENCOUNTER — Other Ambulatory Visit (HOSPITAL_COMMUNITY)
Admission: RE | Admit: 2021-03-21 | Discharge: 2021-03-21 | Disposition: A | Discharge status: Home / Self Care | Payer: PPO | Source: Ambulatory Visit | Attending: Family Medicine | Admitting: Family Medicine

## 2021-03-21 ENCOUNTER — Ambulatory Visit (INDEPENDENT_AMBULATORY_CARE_PROVIDER_SITE_OTHER): Payer: PPO | Admitting: Student

## 2021-03-21 ENCOUNTER — Other Ambulatory Visit: Payer: Self-pay

## 2021-03-21 VITALS — BP 174/80 | HR 66 | Ht 60.0 in | Wt 155.6 lb

## 2021-03-21 DIAGNOSIS — E11618 Type 2 diabetes mellitus with other diabetic arthropathy: Secondary | ICD-10-CM | POA: Diagnosis not present

## 2021-03-21 DIAGNOSIS — N898 Other specified noninflammatory disorders of vagina: Secondary | ICD-10-CM | POA: Diagnosis not present

## 2021-03-21 DIAGNOSIS — I10 Essential (primary) hypertension: Secondary | ICD-10-CM

## 2021-03-21 DIAGNOSIS — H6123 Impacted cerumen, bilateral: Secondary | ICD-10-CM | POA: Diagnosis not present

## 2021-03-21 DIAGNOSIS — L439 Lichen planus, unspecified: Secondary | ICD-10-CM | POA: Diagnosis not present

## 2021-03-21 DIAGNOSIS — Z23 Encounter for immunization: Secondary | ICD-10-CM

## 2021-03-21 LAB — POCT GLYCOSYLATED HEMOGLOBIN (HGB A1C): HbA1c, POC (controlled diabetic range): 6.5 % (ref 0.0–7.0)

## 2021-03-21 MED ORDER — CLOBETASOL PROPIONATE 0.05 % EX OINT: TOPICAL_OINTMENT | CUTANEOUS | 0 refills | Status: DC

## 2021-03-21 MED ORDER — AMLODIPINE BESYLATE 10 MG PO TABS: 10.0000 mg | ORAL_TABLET | Freq: Every day | ORAL | 0 refills | Status: DC

## 2021-03-21 NOTE — Assessment & Plan Note (Signed)
BP not within goal range at office. Initial BP 180/71, repeat 30 minutes later 174/80. Patient currently asymptomatic. Reports she had just taken her blood pressure medicines. - Discussed return precautions including headache, chest pain, dizziness, visual changes with blood pressure >180/110. Patient voiced understanding. - Increased Amlodipine '5mg'$  to '10mg'$  daily at bedtime - Patient will start daily blood pressure log and return with readings in 1 week for office BP follow-up

## 2021-03-21 NOTE — Patient Instructions (Signed)
It was great to see you, Ashley Sloan! Thank you for allowing me to participate in your care!  I recommend that you always bring your medications to each appointment as this makes it easy to ensure we are on the correct medications and helps [place] not miss when refills are needed.  Our plans for today:  - Continue to check your blood pressure at home, and keep a log of the numbers to bring to your follow-up visit in 1 week. Take your blood pressure medications daily as prescribed. Your goal blood pressure range is 110/60-140/90. If you develop dizziness, chest pain, shortness of breath, visual changes with blood pressure elevated out of range, please seek urgent medical care. - Use topical lobetasol ointment twice daily over irritated areas in groin for 2 weeks. If the irritation resolves, stop the cream and use only as needed. -Your A1c was 6.5% today. This is great news! Continue taking your Metformin as prescribed and eating a healthy diet. - For vaginal itching, avoid douching or feminine odor hygiene products. Wash with a mild soap and water.  - We flushed your ears to rid of ear wax. Continue to clean hearing aids appropriately. - You received your flu vaccine.  We checked for bacterial vaginosis, and yeast infection as causes of your vaginal itching. I will call you if they are abnormal will send you a MyChart message or a letter if they are normal.  If you do not hear about your labs in the next 2 weeks please let us know.  Take care and seek immediate care sooner if you develop any concerns.   Dr. Orvis Brill, [place]

## 2021-03-21 NOTE — Assessment & Plan Note (Signed)
Speculum exam performed with specimen collection, sent out to lab.  - Will follow up on results for BV and candidal infection and call patient with appropriate treatment as necessary - Educated patient on vaginal hygiene including avoiding douching and scented vaginal hygiene products; and to wash with mild unscented soap and warm water.

## 2021-03-21 NOTE — Progress Notes (Addendum)
SUBJECTIVE:   CHIEF COMPLAINT / HPI:   Cerumen Impaction Patient supposed to wear hearing aids bilaterally, but lost right hearing aid recently and cannot afford new one. Complains of issues with hearing due to cerumen accumulation. Had flushing in 02/2020. No pain.  Vaginal itching Vaginal itching present for a few months No discharge. Reports itchiness into groin creases with a hyperpigmentation,for which she tried a topical anti-itch cream with no relief. Also has hyperpigmented pruritic plaques over mons pubis and a "white bump" on her labia that is also pruritic.    PERTINENT  PMH / PSH:  Urothelial carcinoma of right kidney: Followed by Dr. Wynetta Emery. S/p right nephroureterectomy and post-operative intravesical instillation of gemcitabine. Patient reporting irritation at incision site, with 1 suture in place that patient says she was told is dissolvable. She would like this removed, which can be done at follow-up visit next week.  Type 2 DM A1c 6.5% today, decreased from 7.1 at last visit 3 months ago. No dizziness. Does not check blood glucose at home. Prescribed Metformin '1000mg'$  BID with meals, takes it occasionally. No hypoglycemic episodes.   Hypertension BP elevated to 180/71. Re-checked and was 174/80 No chest pain, dyspnea, visual changes. Compliant with amlodipine, carvedilol. Says she just took her medications this morning prior to visit. Checks BP at home with ranges from 140s/90s.  OBJECTIVE:   BP (!) 174/80   Pulse 66   Ht 5' (1.524 m)   Wt 155 lb 9.6 oz (70.6 kg)   SpO2 100%   BMI 30.39 kg/m    General: Well-appearing, pleasant female in no distress HEENT: Pupils PERRLA. TM pearly gray with cone of light visualized, moderate amount of cerumen in bilateral ears. CV: RRR, no murmurs Lungs: No increased work of breathing. Lungs clear to auscultation in all fields GU: Mons pubis with numerous polygonal, planar, purplish plaque Left outer labia with a 1cm round,  raised white bump without discharge or erythema. Decreased age-related ruggae of vaginal walls. Small amount of white discharge in posterior fornix. Cervical os closed without friability.  Skin: Warm, dry, well-perfused. Small, well-healing incision site with granulation tissue and no bleeding or discharge at right lower flank with 1 suture in place.  ASSESSMENT/PLAN:   HTN (hypertension), benign BP not within goal range at office. Initial BP 180/71, repeat 30 minutes later 174/80. Patient currently asymptomatic. Reports she had just taken her blood pressure medicines. - Discussed return precautions including headache, chest pain, dizziness, visual changes with blood pressure >180/110. Patient voiced understanding. - Increased Amlodipine '5mg'$  to '10mg'$  daily at bedtime - Patient will start daily blood pressure log and return with readings in 1 week for office BP follow-up  Cerumen impaction Ears with cerumen bilaterally. - Received flushing treatment and reported improvement  Diabetes mellitus, type 2 (HCC) A1c 6.5% today. - Continue Metformin 1000 mg BID - Continue healthy diet  Vaginal itching Speculum exam performed with specimen collection, sent out to lab.  - Will follow up on results for BV and candidal infection and call patient with appropriate treatment as necessary - Educated patient on vaginal hygiene including avoiding douching and scented vaginal hygiene products; and to wash with mild unscented soap and warm water.  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  - Start clobetasol ointment for 2 weeks, twice daily. If resolution of symptoms, can use as needed after completing 2 week course. - If no  improvement with topical ointment, will require biopsy     Orvis Brill, [place]

## 2021-03-21 NOTE — Assessment & Plan Note (Signed)
Ears with cerumen bilaterally. - Received flushing treatment and reported improvement

## 2021-03-21 NOTE — Assessment & Plan Note (Signed)
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  - Start clobetasol ointment for 2 weeks, twice daily. If resolution of symptoms, can use as needed after completing 2 week course. - If no improvement with topical ointment, will require biopsy

## 2021-03-21 NOTE — Assessment & Plan Note (Addendum)
A1c 6.5% today. - Continue Metformin 1000 mg BID - Continue healthy diet

## 2021-03-22 LAB — CERVICOVAGINAL ANCILLARY ONLY
Bacterial Vaginitis (gardnerella): NEGATIVE
Comment: NEGATIVE

## 2021-03-23 ENCOUNTER — Telehealth: Payer: Self-pay | Admitting: Student

## 2021-03-23 ENCOUNTER — Encounter: Payer: Self-pay | Admitting: Student

## 2021-03-23 NOTE — Progress Notes (Signed)
Sent letter to patient about negative BV, encouraging patient to call if there are any questions.

## 2021-03-23 NOTE — Telephone Encounter (Signed)
Erroneous encounter

## 2021-04-01 ENCOUNTER — Other Ambulatory Visit: Payer: Self-pay | Admitting: Family Medicine

## 2021-04-01 ENCOUNTER — Ambulatory Visit (INDEPENDENT_AMBULATORY_CARE_PROVIDER_SITE_OTHER): Payer: PPO | Admitting: Family Medicine

## 2021-04-01 ENCOUNTER — Other Ambulatory Visit: Payer: Self-pay

## 2021-04-01 VITALS — BP 165/75 | HR 70 | Temp 98.3°F | Wt 152.1 lb

## 2021-04-01 DIAGNOSIS — I10 Essential (primary) hypertension: Secondary | ICD-10-CM | POA: Diagnosis not present

## 2021-04-01 DIAGNOSIS — R1084 Generalized abdominal pain: Secondary | ICD-10-CM

## 2021-04-01 MED ORDER — IRBESARTAN 75 MG PO TABS: 75.0000 mg | ORAL_TABLET | Freq: Every day | ORAL | 0 refills | Status: DC

## 2021-04-01 NOTE — Patient Instructions (Addendum)
It was great seeing you today!  Today you came in for a blood pressure check and it was still elevated at 165/75. We are starting you on Irbesartan which your insurance covers over Losartan. We are starting on low dose and can increase it later if you tolerate it.    Please check-out at the front desk before leaving the clinic. Please schedule follow up in 1-2 weeks to check your labs and blood pressure, and see how you are tolerating the medication.   Feel free to call with any questions or concerns at any time, at [phone].   Take care,  Dr. Shary Key [facility]    Irbesartan Tablets What is this medication? IRBESARTAN (ir be SAR tan) is an angiotensin II receptor blocker, also known as an ARB. It treats high blood pressure. It can also slow kidney damage in some patients. This medicine may be used for other purposes; ask your health care provider or pharmacist if you have questions. COMMON BRAND NAME(S): Avapro What should I tell my care team before I take this medication? They need to know if you have any of these conditions: heart failure kidney or liver disease an unusual or allergic reaction to irbesartan, other medicines, foods, dyes, or preservatives pregnant or trying to get pregnant breast-feeding How should I use this medication? Take this drug by mouth. Take it as directed on the prescription label at the same time every day. You can take it with or without food. If it upsets your stomach, take it with food. Keep taking it unless your health care provider tells you to stop. Talk to your health care provider about the use of this drug in children. Special care may be needed. Overdosage: If you think you have taken too much of this medicine contact a poison control center or emergency room at once. NOTE: This medicine is only for you. Do not share this medicine with others. What if I miss a dose? If you miss a dose, take it as soon as you  can. If it is almost time for your next dose, take only that dose. Do not take double or extra doses. What may interact with this medication? diuretics, especially triamterene, spironolactone, or amiloride potassium salts or potassium supplements This list may not describe all possible interactions. Give your health care provider a list of all the medicines, herbs, non-prescription drugs, or dietary supplements you use. Also tell them if you smoke, drink alcohol, or use illegal drugs. Some items may interact with your medicine. What should I watch for while using this medication? Visit your doctor or health care professional for regular checks on your progress. Check your blood pressure as directed. Ask your doctor or health care professional what your blood pressure should be and when you should contact him or her. Call your doctor or health care professional if you notice an irregular or fast heart beat. Women should inform their doctor if they wish to become pregnant or think they might be pregnant. There is a potential for serious side effects to an unborn child, particularly in the second or third trimester. Talk to your health care professional or pharmacist for more information. You may get drowsy or dizzy. Do not drive, use machinery, or do anything that needs mental alertness until you know how this drug affects you. Do not stand or sit up quickly, especially if you are an older patient. This reduces the risk of dizzy or fainting spells. Alcohol can  make you more drowsy and dizzy. Avoid alcoholic drinks. Avoid salt substitutes unless you are told otherwise by your doctor or health care professional. Do not treat yourself for coughs, colds, or pain while you are taking this medicine without asking your doctor or health care professional for advice. Some ingredients may increase your blood pressure. What side effects may I notice from receiving this medication? Side effects that you should report  to your doctor or health care professional as soon as possible: confusion, dizziness, light headedness or fainting spells decreased amount of urine passed difficulty breathing or swallowing, hoarseness, or tightening of the throat fast or irregular heart beat or palpitations swelling of your face, lips, tongue, hands, or feet unusual rash or hives Side effects that usually do not require medical attention (report to your doctor or health care professional if they continue or are bothersome): decreased sexual function diarrhea fatigue or tiredness heartburn This list may not describe all possible side effects. Call your doctor for medical advice about side effects. You may report side effects to FDA at 1-800-FDA-1088. Where should I keep my medication? Keep out of the reach of children and pets. Store at room temperature between 15 and 30 degrees C (59 and 86 degrees F). Throw away any unused drug after the expiration date. NOTE: This sheet is a summary. It may not cover all possible information. If you have questions about this medicine, talk to your doctor, pharmacist, or health care provider.  2022 [employer]vier/Gold Standard (2019-01-21 19:05:16)

## 2021-04-01 NOTE — Progress Notes (Signed)
    SUBJECTIVE:   CHIEF COMPLAINT / HPI:   Ms. Gledhill is a 70 yo who presents for BP check  States last week started with a cold and had a fever. Endorses come coughing. Had one episode of emesis yesterday. Endorses diffuse stomach pain as well that started last week. Has had loose bowel movements. Has been able to eat and drink but has had decreased appetite. Coughing has improved, and no current fevers. No sick contacts. Works at a nursing home and has had to take a COVID test in the past week and was negative   HTN: Current BP 165/75 Denies CP, headache, change in vision, palpitations, SOB    States she takes her BP at home. Last night 140/83 which is the lowest she has seen   Has previusly used Lisinopril-HCTZ but was discontinued due to normotensive BP and gout    OBJECTIVE:   BP (!) 165/75   Pulse 70   Temp 98.3 F (36.8 C) (Oral)   Wt 152 lb 2 oz (69 kg)   SpO2 99%   BMI 29.71 kg/m    General: alert, pleasant, NAD CV: RRR no murmurs Resp: CTAB normal WOB GI: soft, non distended. Tender to palpation diffusely, worse in R and L lower quadrants  Neuro:   ASSESSMENT/PLAN:   No problem-specific Assessment & Plan notes found for this encounter.   HTN BP 165/75. Denies CP, headaches, blurry vision. Taking Amlodipine 10mg  which was increased at last visit. Carvedilol 25mg  BID. Avoiding Lisinopril-HCTZ, but patient had history gout. Will start Losartan ( Losartan not covered by insurance )   Abdominal pain Diffuse abdominal pain started 1 week ago along with fever, cough and congestion. Endorses loose stool for the past week. Denies blood in stool. On physical exam abdomen tender to palpation diffusely but worse in lower quadrants bilaterally.Abdominal pain likely due to viral infection though will follow up on pain when she returns and if persists can consider further work up   Return in 2-3 weeks for labs and BP check   Shary Key, [place]

## 2021-04-08 DIAGNOSIS — C651 Malignant neoplasm of right renal pelvis: Secondary | ICD-10-CM | POA: Diagnosis not present

## 2021-04-14 NOTE — Progress Notes (Signed)
    SUBJECTIVE:   CHIEF COMPLAINT / HPI: Follow-up HTN  HTN Takes amlodipine 10 mg and carvedilol 25 mg twice daily.  She was started on a irbesartan 75 mg at last visit 2 weeks ago as BP was not controlled. Home readings: has not been measuring Reports good adherence to medications   She had also reported cold symptoms, fever, and diffuse abdominal pain with loose stools at last visit and had had a negative COVID test prior to that visit. Overall improved today but still has a slight cough. Reports no abdominal pain currently.  PERTINENT  PMH / PSH: HTN, T2DM  OBJECTIVE:   BP 138/66   Pulse (!) 59   Ht 5' (1.524 m)   Wt 161 lb 6.4 oz (73.2 kg)   SpO2 100%   BMI 31.52 kg/m   General: obese elderly female, NAD CV: RRR, no murmurs Pulm: CTAB, no wheezes or rales Ext: bilateral non-pitting edema in feet  ASSESSMENT/PLAN:   HTN (hypertension), benign Initially elevated but improved to 138/66 on recheck.  No changes to current medications.  We will obtain BMP today.  Encouraged to keep BP log at home.   Leg swelling Nonpitting edema to bilateral feet.  Amlodipine likely contributing.  Advised to reduce sodium intake and elevate legs.  If bothersome, could consider switching amlodipine to another agent such as HCTZ.  HCM - mammogram ordered  Zola Button, MD [place]

## 2021-04-14 NOTE — Patient Instructions (Addendum)
It was nice seeing you today!  We will send you a letter in the mail if your labs look fine. Otherwise I will give you a call.  Check your blood pressure every day if you can.  Try to reduce sodium in your diet and elevate your legs when you can when you have swelling.  Follow-up in 3 months.  Please arrive at least 15 minutes prior to your scheduled appointments.  Stay well, Zola Button, MD [place] [phone]

## 2021-04-15 ENCOUNTER — Other Ambulatory Visit: Payer: Self-pay

## 2021-04-15 ENCOUNTER — Ambulatory Visit (INDEPENDENT_AMBULATORY_CARE_PROVIDER_SITE_OTHER): Payer: PPO | Admitting: Family Medicine

## 2021-04-15 ENCOUNTER — Encounter: Payer: Self-pay | Admitting: Family Medicine

## 2021-04-15 VITALS — BP 138/66 | HR 59 | Ht 60.0 in | Wt 161.4 lb

## 2021-04-15 DIAGNOSIS — Z1231 Encounter for screening mammogram for malignant neoplasm of breast: Secondary | ICD-10-CM

## 2021-04-15 DIAGNOSIS — I10 Essential (primary) hypertension: Secondary | ICD-10-CM

## 2021-04-15 DIAGNOSIS — M7989 Other specified soft tissue disorders: Secondary | ICD-10-CM

## 2021-04-15 NOTE — Assessment & Plan Note (Signed)
Initially elevated but improved to 138/66 on recheck.  No changes to current medications.  We will obtain BMP today.  Encouraged to keep BP log at home.

## 2021-04-16 ENCOUNTER — Encounter: Payer: Self-pay | Admitting: Family Medicine

## 2021-04-16 LAB — BASIC METABOLIC PANEL
BUN/Creatinine Ratio: 11 — ABNORMAL LOW (ref 12–28)
BUN: 12 mg/dL (ref 8–27)
CO2: 22 mmol/L (ref 20–29)
Calcium: 9.1 mg/dL (ref 8.7–10.3)
Chloride: 106 mmol/L (ref 96–106)
Creatinine, Ser: 1.12 mg/dL — ABNORMAL HIGH (ref 0.57–1.00)
Glucose: 97 mg/dL (ref 70–99)
Potassium: 4.6 mmol/L (ref 3.5–5.2)
Sodium: 141 mmol/L (ref 134–144)
eGFR: 52 mL/min/{1.73_m2} — ABNORMAL LOW (ref >59)

## 2021-05-02 ENCOUNTER — Other Ambulatory Visit: Payer: Self-pay | Admitting: Family Medicine

## 2021-05-24 ENCOUNTER — Ambulatory Visit
Admission: RE | Admit: 2021-05-24 | Discharge: 2021-05-24 | Disposition: A | Discharge status: Home / Self Care | Payer: PPO | Source: Ambulatory Visit | Attending: Family Medicine | Admitting: Family Medicine

## 2021-05-24 DIAGNOSIS — Z1231 Encounter for screening mammogram for malignant neoplasm of breast: Secondary | ICD-10-CM | POA: Diagnosis not present

## 2021-05-27 ENCOUNTER — Encounter: Payer: Self-pay | Admitting: Family Medicine

## 2021-06-07 ENCOUNTER — Other Ambulatory Visit: Payer: Self-pay | Admitting: Family Medicine

## 2021-06-07 ENCOUNTER — Other Ambulatory Visit: Payer: Self-pay | Admitting: Student

## 2021-06-08 ENCOUNTER — Other Ambulatory Visit: Payer: Self-pay | Admitting: Family Medicine

## 2021-06-10 ENCOUNTER — Other Ambulatory Visit: Payer: Self-pay | Admitting: Family Medicine

## 2021-06-10 DIAGNOSIS — E785 Hyperlipidemia, unspecified: Secondary | ICD-10-CM

## 2021-07-05 ENCOUNTER — Encounter: Payer: Self-pay | Admitting: Family Medicine

## 2021-07-05 ENCOUNTER — Other Ambulatory Visit: Payer: Self-pay

## 2021-07-05 ENCOUNTER — Ambulatory Visit (INDEPENDENT_AMBULATORY_CARE_PROVIDER_SITE_OTHER): Payer: PPO | Admitting: Family Medicine

## 2021-07-05 VITALS — BP 152/63 | HR 76 | Wt 159.6 lb

## 2021-07-05 DIAGNOSIS — R1084 Generalized abdominal pain: Secondary | ICD-10-CM | POA: Diagnosis not present

## 2021-07-05 DIAGNOSIS — M545 Low back pain, unspecified: Secondary | ICD-10-CM

## 2021-07-05 DIAGNOSIS — M549 Dorsalgia, unspecified: Secondary | ICD-10-CM | POA: Insufficient documentation

## 2021-07-05 DIAGNOSIS — E11618 Type 2 diabetes mellitus with other diabetic arthropathy: Secondary | ICD-10-CM

## 2021-07-05 LAB — POCT GLYCOSYLATED HEMOGLOBIN (HGB A1C): HbA1c, POC (controlled diabetic range): 6.9 % (ref 0.0–7.0)

## 2021-07-05 MED ORDER — TIZANIDINE HCL 2 MG PO CAPS: 2.0000 mg | ORAL_CAPSULE | Freq: Every evening | ORAL | 0 refills | Status: DC | PRN

## 2021-07-05 NOTE — Patient Instructions (Signed)
Good to see you today - Thank you for coming in  Things we discussed today:  Back - Take tylenol 2 tabs 3-4 times a day - Use zanaflex as needed  - if not better in 3-4 weeks or if a lot worse then call us  Abdomen If worsening especially with fever or nausea and vomiting then call us  Contact your [facility] for your colonoscopy   You need an diabetes eye exam every year.  Please see your eye doctor.  Ask them to fax [place] a report of your exam

## 2021-07-05 NOTE — Progress Notes (Signed)
° ° °  SUBJECTIVE:   CHIEF COMPLAINT / HPI:   Abdomen Pain For the last 3 weeks slowly improving.  Describes diffuse mild moderate cramping.  No nausea and vomiting or fever or chills or dysuria   L Back Pain For last 2 weeks.  Worse with movement.  No weakness or incontinence.  No specific injury.  Tylenol helps some.  Feels like sciatica that she has had before  PERTINENT  PMH / PSH: Hypertension, Diabetes, Lipids, Gout   OBJECTIVE:   BP (!) 152/63    Pulse 76    Wt 159 lb 9.6 oz (72.4 kg)    SpO2 100%    BMI 31.17 kg/m   Alert no distress Mobility:able to get up and down from exam table without assistance or severe distress Back - good range of motion.  Tender mildly diffusely over L paraspinous muscles.  No CVAT or spina tenderness.  No SLR Able to walk on heels and toes Abdomen: soft and mildly tender diffusely without masses, organomegaly or hernias noted.  No guarding or rebound    ASSESSMENT/PLAN:   Abdominal pain Subacute improving.  Mostly consistent with possible viral irritation.  No red flags and given that is improving on its own will observe.  Would get cbc and perhaps imaging if worsening   Back pain Consistent with musculoskeletal strain.  No signs of nerve impingement or UTI.   Treat symptomatically with as needed muscle relaxer and tylenol and monitor for resolution      Lind Covert, [place]

## 2021-07-05 NOTE — Assessment & Plan Note (Signed)
Consistent with musculoskeletal strain.  No signs of nerve impingement or UTI.   Treat symptomatically with as needed muscle relaxer and tylenol and monitor for resolution

## 2021-07-05 NOTE — Assessment & Plan Note (Signed)
Subacute improving.  Mostly consistent with possible viral irritation.  No red flags and given that is improving on its own will observe.  Would get cbc and perhaps imaging if worsening

## 2021-07-21 ENCOUNTER — Emergency Department (HOSPITAL_COMMUNITY): Payer: PPO

## 2021-07-21 ENCOUNTER — Emergency Department (HOSPITAL_COMMUNITY)
Admission: EM | Admit: 2021-07-21 | Discharge: 2021-07-21 | Disposition: A | Discharge status: Home / Self Care | Payer: PPO | Attending: Emergency Medicine | Admitting: Emergency Medicine

## 2021-07-21 ENCOUNTER — Encounter (HOSPITAL_COMMUNITY): Payer: Self-pay

## 2021-07-21 ENCOUNTER — Other Ambulatory Visit: Payer: Self-pay

## 2021-07-21 DIAGNOSIS — M545 Low back pain, unspecified: Secondary | ICD-10-CM | POA: Insufficient documentation

## 2021-07-21 DIAGNOSIS — R55 Syncope and collapse: Secondary | ICD-10-CM | POA: Insufficient documentation

## 2021-07-21 DIAGNOSIS — Z20822 Contact with and (suspected) exposure to covid-19: Secondary | ICD-10-CM | POA: Insufficient documentation

## 2021-07-21 DIAGNOSIS — M542 Cervicalgia: Secondary | ICD-10-CM | POA: Diagnosis present

## 2021-07-21 DIAGNOSIS — W010XXA Fall on same level from slipping, tripping and stumbling without subsequent striking against object, initial encounter: Secondary | ICD-10-CM | POA: Insufficient documentation

## 2021-07-21 DIAGNOSIS — W19XXXA Unspecified fall, initial encounter: Secondary | ICD-10-CM

## 2021-07-21 DIAGNOSIS — Y9289 Other specified places as the place of occurrence of the external cause: Secondary | ICD-10-CM | POA: Insufficient documentation

## 2021-07-21 LAB — CBC WITH DIFFERENTIAL/PLATELET
Abs Immature Granulocytes: 0.02 10*3/uL (ref 0.00–0.07)
Basophils Absolute: 0 10*3/uL (ref 0.0–0.1)
Basophils Relative: 1 %
Eosinophils Absolute: 0.2 10*3/uL (ref 0.0–0.5)
Eosinophils Relative: 2 %
HCT: 34.3 % — ABNORMAL LOW (ref 36.0–46.0)
Hemoglobin: 11 g/dL — ABNORMAL LOW (ref 12.0–15.0)
Immature Granulocytes: 0 %
Lymphocytes Relative: 32 %
Lymphs Abs: 2.3 10*3/uL (ref 0.7–4.0)
MCH: 28.6 pg (ref 26.0–34.0)
MCHC: 32.1 g/dL (ref 30.0–36.0)
MCV: 89.1 fL (ref 80.0–100.0)
Monocytes Absolute: 0.6 10*3/uL (ref 0.1–1.0)
Monocytes Relative: 8 %
Neutro Abs: 4.1 10*3/uL (ref 1.7–7.7)
Neutrophils Relative %: 57 %
Platelets: 210 10*3/uL (ref 150–400)
RBC: 3.85 MIL/uL — ABNORMAL LOW (ref 3.87–5.11)
RDW: 13.3 % (ref 11.5–15.5)
WBC: 7.1 10*3/uL (ref 4.0–10.5)
nRBC: 0 % (ref 0.0–0.2)

## 2021-07-21 LAB — COMPREHENSIVE METABOLIC PANEL
ALT: 17 U/L (ref 0–44)
AST: 21 U/L (ref 15–41)
Albumin: 3.6 g/dL (ref 3.5–5.0)
Alkaline Phosphatase: 104 U/L (ref 38–126)
Anion gap: 9 (ref 5–15)
BUN: 13 mg/dL (ref 8–23)
CO2: 21 mmol/L — ABNORMAL LOW (ref 22–32)
Calcium: 9.4 mg/dL (ref 8.9–10.3)
Chloride: 109 mmol/L (ref 98–111)
Creatinine, Ser: 1.19 mg/dL — ABNORMAL HIGH (ref 0.44–1.00)
GFR, Estimated: 49 mL/min — ABNORMAL LOW (ref >60)
Glucose, Bld: 123 mg/dL — ABNORMAL HIGH (ref 70–99)
Potassium: 4.5 mmol/L (ref 3.5–5.1)
Sodium: 139 mmol/L (ref 135–145)
Total Bilirubin: 1.2 mg/dL (ref 0.3–1.2)
Total Protein: 6.7 g/dL (ref 6.5–8.1)

## 2021-07-21 LAB — RESP PANEL BY RT-PCR (FLU A&B, COVID) ARPGX2
Influenza A by PCR: NEGATIVE
Influenza B by PCR: NEGATIVE
SARS Coronavirus 2 by RT PCR: NEGATIVE

## 2021-07-21 LAB — CBG MONITORING, ED: Glucose-Capillary: 121 mg/dL — ABNORMAL HIGH (ref 70–99)

## 2021-07-21 MED ORDER — ACETAMINOPHEN 500 MG PO TABS
1000.0000 mg | ORAL_TABLET | Freq: Once | ORAL | Status: AC
  Administered 2021-07-21: 1000 mg via ORAL
  Filled 2021-07-21: qty 2

## 2021-07-21 MED ORDER — SODIUM CHLORIDE 0.9 % IV SOLN: INTRAVENOUS | Status: DC

## 2021-07-21 MED ORDER — MORPHINE SULFATE (PF) 4 MG/ML IV SOLN
4.0000 mg | Freq: Once | INTRAVENOUS | Status: AC
  Administered 2021-07-21: 4 mg via INTRAVENOUS
  Filled 2021-07-21: qty 1

## 2021-07-21 NOTE — ED Triage Notes (Signed)
Pt arrived to ED via EMS from [place], where pt works. Pt had witnessed syncopal episode while at work. Pt fell prone and is c/o acute midline C-spine tenderness to palpation and arrived in a C-collar. Pt also c/o exacerbated chronic low back pain which is bothering her more than her neck at this time. Hx of DM, HTN. EMS VS: CBG 151, BP 142/70, sinus arrhythmia w/ lowest HR 48. 20g L AC.  Pt reports not eating breakfast this morning, but took her meds (BP meds and lipitor). Pt denies hx of similar event. Pt reports remembering tripping while walking.

## 2021-07-21 NOTE — ED Notes (Signed)
Pt transported to Xray at this time.

## 2021-07-21 NOTE — Discharge Instructions (Signed)
As discussed, today's evaluation has been generally reassuring.  However, with today's episode of losing consciousness it is very important that you follow-up with your physician.  Please discuss your medications, and the need for appropriate outpatient management.  Additional consideration of continuous cardiac monitoring can be performed with your physician as well.  Return here for concerning changes in your condition.

## 2021-07-21 NOTE — ED Notes (Signed)
Reviewed discharge instructions with patient and daughter. Follow-up care reviewed. Patient and daughter verbalized understanding. Patient A&Ox4, VSS, and ambulatory with steady gait upon discharge.  

## 2021-07-21 NOTE — ED Provider Notes (Signed)
[facility] EMERGENCY DEPARTMENT Provider Note   CSN: [id number] Arrival date & time: 07/21/21  0240     History  Chief Complaint  Patient presents with   Loss of Consciousness    Ashley Sloan is a 71 y.o. female.  HPI Adult female arrives via EMS after an episode of syncope.  Patient describes the event as tripping, falling, but per report she was witnessed to have an episode of syncope with falling into a supine position.  EMS does not report hypotension.  Patient currently complains of pain in her neck, low back, possibly weakness in her right leg.  She has a history of sciatica, notes that the pain is about the same as usual, possibly marginally worse in the lower back, sore.  Pain in her neck is midline, sore.  She denies chest pain at any point today, states that she was in her usual state of health this morning, went to work, did not eat breakfast, took her beta-blocker medication.     Home Medications Prior to Admission medications   Medication Sig Start Date End Date Taking? Authorizing Provider  acetaminophen (TYLENOL) 500 MG tablet Take 1,000 mg by mouth every 6 (six) hours as needed for mild pain.   Yes [provider]  amLODipine (NORVASC) 10 MG tablet TAKE 1 TABLET(10 MG) BY MOUTH AT BEDTIME Patient taking differently: Take 10 mg by mouth daily. 06/08/21  Yes Ganta, Anupa, DO  atorvastatin (LIPITOR) 40 MG tablet TAKE 1 TABLET(40 MG) BY MOUTH DAILY Patient taking differently: Take 40 mg by mouth daily. 06/10/21  Yes Ganta, Anupa, DO  carvedilol (COREG) 25 MG tablet TAKE 1 TABLET(25 MG) BY MOUTH TWICE DAILY WITH A MEAL Patient taking differently: Take 25 mg by mouth 2 (two) times daily with a meal. 03/14/21  Yes Ganta, Anupa, DO  clobetasol ointment (TEMOVATE) 0.05 % Apply twice daily to irritated areas over the groin. Patient taking differently: 1 application 2 (two) times daily as needed (irritated area(s)). Apply twice daily  as needed to  irritated areas over the groin. 03/21/21  Yes Dameron, Luna Fuse, DO  irbesartan (AVAPRO) 75 MG tablet TAKE 1 TABLET(75 MG) BY MOUTH DAILY Patient taking differently: Take 75 mg by mouth daily. 06/08/21  Yes Ganta, Anupa, DO  metFORMIN (GLUCOPHAGE) 1000 MG tablet TAKE 1 TABLET(1000 MG) BY MOUTH TWICE DAILY WITH A MEAL Patient taking differently: Take 1,000 mg by mouth 2 (two) times daily with a meal. 12/09/20  Yes Ganta, Anupa, DO  tizanidine (ZANAFLEX) 2 MG capsule Take 1 capsule (2 mg total) by mouth at bedtime as needed for muscle spasms. 07/05/21  Yes Chambliss, Jeb Levering, MD      Allergies    Patient has no known allergies.    Review of Systems   Review of Systems  Constitutional:        Per HPI, otherwise negative  HENT:         Per HPI, otherwise negative  Respiratory:         Per HPI, otherwise negative  Cardiovascular:        Per HPI, otherwise negative  Gastrointestinal:  Negative for vomiting.  Endocrine:       Negative aside from HPI  Genitourinary:        Neg aside from HPI   Musculoskeletal:        Per HPI, otherwise negative  Skin: Negative.   Neurological:  Positive for syncope.   Physical Exam Updated Vital Signs BP (!) 154/82  Pulse 62    Temp 97.8 F (36.6 C) (Oral)    Resp 14    Ht 5' (1.524 m)    Wt 72.4 kg    SpO2 100%    BMI 31.17 kg/m  Physical Exam Vitals and nursing note reviewed.  Constitutional:      General: She is not in acute distress.    Appearance: She is well-developed.  HENT:     Head: Normocephalic and atraumatic.  Eyes:     Conjunctiva/sclera: Conjunctivae normal.  Neck:     Comments: Cervical collar in place no deformity Cardiovascular:     Rate and Rhythm: Normal rate and regular rhythm.  Pulmonary:     Effort: Pulmonary effort is normal. No respiratory distress.     Breath sounds: Normal breath sounds. No stridor.  Abdominal:     General: There is no distension.  Skin:    General: Skin is warm and dry.  Neurological:      Mental Status: She is alert and oriented to person, place, and time.     Cranial Nerves: No cranial nerve deficit.     Comments: Strength is objectively same in upper and lower extremities, the patient does describe subjective weakness in the right lower extremity proximally.  Face is symmetric, speech is clear.    ED Results / Procedures / Treatments   Labs (all labs ordered are listed, but only abnormal results are displayed) Labs Reviewed  COMPREHENSIVE METABOLIC PANEL - Abnormal; Notable for the following components:      Result Value   CO2 21 (*)    Glucose, Bld 123 (*)    Creatinine, Ser 1.19 (*)    GFR, Estimated 49 (*)    All other components within normal limits  CBC WITH DIFFERENTIAL/PLATELET - Abnormal; Notable for the following components:   RBC 3.85 (*)    Hemoglobin 11.0 (*)    HCT 34.3 (*)    All other components within normal limits  CBG MONITORING, ED - Abnormal; Notable for the following components:   Glucose-Capillary 121 (*)    All other components within normal limits  RESP PANEL BY RT-PCR (FLU A&B, COVID) [id number]    EKG EKG Interpretation  Date/Time:  Thursday July 21 2021 08:51:34 EST Ventricular Rate:  58 PR Interval:  173 QRS Duration: 114 QT Interval:  443 QTC Calculation: 436 R Axis:   -28 Text Interpretation: Sinus rhythm Incomplete right bundle branch block Low voltage, precordial leads Left ventricular hypertrophy Abnormal ECG Confirmed by Carmin Muskrat [phone]) on 07/21/2021 9:01:04 AM  Radiology DG Chest 1 View  Result Date: 07/21/2021 CLINICAL DATA:  71 year old female status post fall with pain. EXAM: CHEST  1 VIEW COMPARISON:  CT Chest, Abdomen, and Pelvis 09/04/2020 and earlier. FINDINGS: AP view at 0927 hours. Lung volumes and mediastinal contours are stable and within normal limits. Visualized tracheal air column is within normal limits. Allowing for portable technique the lungs are clear. No pneumothorax or pleural effusion. Chronic  left axillary surgical clips. Chronic degenerative changes at both shoulders. No acute osseous abnormality identified. Paucity of bowel gas. IMPRESSION: No acute cardiopulmonary abnormality or acute traumatic injury identified. Electronically Signed   By: Genevie Ann M.D.   On: 07/21/2021 09:30   DG Lumbar Spine 2-3 Views  Result Date: 07/21/2021 CLINICAL DATA:  71 year old female status post fall with pain. EXAM: LUMBAR SPINE - 2-3 VIEW COMPARISON:  Lumbar radiographs 12/14/2016. FINDINGS: Bone mineralization is within normal limits for age. Stable lumbar vertebral height  and alignment with preserved lordosis. Widespread bulky endplate spurring in the thoracic and lumbar spine. Multilevel lower thoracic through upper lumbar interbody ankylosis suspected, likely through L3. Relatively preserved disc spaces. Mild chronic grade 1 anterolisthesis L4 on L5. Grossly intact sacrum. No acute osseous abnormality identified. Aortoiliac calcified atherosclerosis. Negative visible bowel gas. IMPRESSION: 1.  No acute osseous abnormality identified in the lumbar spine. 2. 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 3.  Aortic Atherosclerosis (ICD10-I70.0). Electronically Signed   By: Genevie Ann M.D.   On: 07/21/2021 09:32   DG Elbow Complete Right  Result Date: 07/21/2021 CLINICAL DATA:  71 year old female status post fall with pain. EXAM: RIGHT ELBOW - COMPLETE 3+ VIEW COMPARISON:  None. FINDINGS: Bone mineralization is within normal limits for age. Normal alignment. Degenerative spurring including at the olecranon. No evidence of joint effusion. Radial head appears intact. Small chronic lateral supracondylar fragments. No acute fracture or dislocation identified. IMPRESSION: No acute fracture or dislocation identified about the right elbow. Electronically Signed   By: Genevie Ann M.D.   On: 07/21/2021 09:28   CT HEAD WO CONTRAST  Result Date: 07/21/2021 CLINICAL  DATA:  71 year old female status post fall with pain. EXAM: CT HEAD WITHOUT CONTRAST TECHNIQUE: Contiguous axial images were obtained from the base of the skull through the vertex without intravenous contrast. RADIATION DOSE REDUCTION: This exam was performed according to the departmental dose-optimization program which includes automated exposure control, adjustment of the mA and/or kV according to patient size and/or use of iterative reconstruction technique. COMPARISON:  None. FINDINGS: Brain: Cerebral volume is within normal limits for age. No midline shift, ventriculomegaly, mass effect, evidence of mass lesion, intracranial hemorrhage or evidence of cortically based acute infarction. Gray-white matter differentiation is within normal limits throughout the brain. Vascular: Calcified atherosclerosis at the skull base. No suspicious intracranial vascular hyperdensity. Skull: No fracture identified. Sinuses/Orbits: Visualized paranasal sinuses and mastoids are well aerated. Right-sided hearing aid Other: No orbit or scalp soft tissue injury identified. IMPRESSION: Normal for age non contrast CT appearance of the brain. No acute traumatic injury identified. Electronically Signed   By: Genevie Ann M.D.   On: 07/21/2021 09:46   CT CERVICAL SPINE WO CONTRAST  Result Date: 07/21/2021 CLINICAL DATA:  71 year old female status post fall with pain. EXAM: CT CERVICAL SPINE WITHOUT CONTRAST TECHNIQUE: Multidetector CT imaging of the cervical spine was performed without intravenous contrast. Multiplanar CT image reconstructions were also generated. RADIATION DOSE REDUCTION: This exam was performed according to the departmental dose-optimization program which includes automated exposure control, adjustment of the mA and/or kV according to patient size and/or use of iterative reconstruction technique. COMPARISON:  Head CT today reported separately. FINDINGS: Alignment: Straightening of cervical lordosis. Cervicothoracic  junction alignment is within normal limits. Bilateral posterior element alignment is within normal limits. Skull base and vertebrae: Visualized skull base is intact. No atlanto-occipital dissociation. C1 and C2 appear intact and aligned. No acute osseous abnormality identified. Soft tissues and spinal canal: No prevertebral fluid or swelling. No visible canal hematoma. Negative visible noncontrast neck soft tissues. Disc levels: Widespread bulky degenerative endplate spurring. But relatively preserved disc spaces. Degenerative subchondral cyst at the tip of the odontoid process with regional partially calcified degenerative ligamentous hypertrophy. Questionable some ossification of the posterior longitudinal ligament (OPLL). At least mild degenerative spinal stenosis at C4-C5 likely with some spinal cord mass effect. Upper chest: Visible upper thoracic levels appear intact. Apical lung generalized septal pulmonary septal thickening. Other:  TMJ degeneration. IMPRESSION: 1. No acute traumatic injury identified in the cervical spine. 2. 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 3. Pulmonary septal thickening in the lung apices raising the possibility of interstitial edema. Electronically Signed   By: Genevie Ann M.D.   On: 07/21/2021 09:50    Procedures Procedures    Medications Ordered in ED Medications  0.9 %  sodium chloride infusion (0 mLs Intravenous Stopped 07/21/21 1401)  acetaminophen (TYLENOL) tablet 1,000 mg (1,000 mg Oral Given 07/21/21 1305)  morphine 4 MG/ML injection 4 mg (4 mg Intravenous Given 07/21/21 1306)    ED Course/ Medical Decision Making/ A&P Patient presents after syncope, fall.  Differential including arrhythmia, ACS, intracranial hemorrhage, electrolyte abnormality, dehydration considered.  Patient placed on continuous cardiac  monitor, labs, CT, x-ray ordered.  Cardiac sinus rhythm, rate 60s, unremarkable. Pulse ox 99% room air normal  EMS rhythm strip sinus arrhythmia, rate 62, borderline QT prolongation  2:30 PM Patient accompanied by daughter.  We discussed all findings, we discussed admission versus outpatient follow-up, the latter is the preference of the patient, we discussed return precautions.                         Medical Decision Making L female presents after episode of syncope.  Patient's evaluation here consistent with mild dehydration, negative for obvious infection, she has no neurodeficits, no CT evidence for intracranial injury, no x-ray evidence for fractures all of which I reviewed, interpreted myself.  Patient did take dose of beta-blocker, but did not eat prior to the event, some suggestion of medication effects. After findings were discussed, patient completed resuscitation with fluids we discussed admission versus discharge.  Patient will be discharged, after conversation on return precautions, follow-up instructions to follow-up with her physician, with plan for consideration of outpatient monitoring.  Amount and/or Complexity of Data Reviewed Independent Historian: caregiver and EMS External Data Reviewed: ECG. Labs: ordered. Decision-making details documented in ED Course. Radiology: ordered and independent interpretation performed. Decision-making details documented in ED Course. ECG/medicine tests: ordered and independent interpretation performed. Decision-making details documented in ED Course.  Risk OTC drugs. Prescription drug management. Decision regarding hospitalization.  Critical Care Total time providing critical care: < 30 minutes       Final Clinical Impression(s) / ED Diagnoses Final diagnoses:  Fall, initial encounter  Syncope and collapse     Carmin Muskrat, MD 07/21/21 1434

## 2021-07-27 ENCOUNTER — Encounter: Payer: Self-pay | Admitting: Family Medicine

## 2021-07-27 ENCOUNTER — Ambulatory Visit (INDEPENDENT_AMBULATORY_CARE_PROVIDER_SITE_OTHER): Payer: PPO | Admitting: Family Medicine

## 2021-07-27 ENCOUNTER — Other Ambulatory Visit: Payer: Self-pay

## 2021-07-27 DIAGNOSIS — T7840XA Allergy, unspecified, initial encounter: Secondary | ICD-10-CM | POA: Insufficient documentation

## 2021-07-27 DIAGNOSIS — M545 Low back pain, unspecified: Secondary | ICD-10-CM

## 2021-07-27 MED ORDER — CETIRIZINE HCL 10 MG PO TABS: 10.0000 mg | ORAL_TABLET | Freq: Every day | ORAL | 1 refills | Status: DC

## 2021-07-27 MED ORDER — DICLOFENAC SODIUM 1 % EX GEL: 2.0000 g | Freq: Four times a day (QID) | CUTANEOUS | 3 refills | Status: DC

## 2021-07-27 MED ORDER — TRAMADOL HCL 50 MG PO TABS: 50.0000 mg | ORAL_TABLET | Freq: Three times a day (TID) | ORAL | 1 refills | Status: AC | PRN

## 2021-07-27 NOTE — Patient Instructions (Signed)
You have a lot of arthritis in your back.  I am not surprised that the fall aggrevated the back. I sent in a pain medicine by mouth, a rub for the back pain and an antihistamine for the leg rash. Let me know if you change your mind about the work note or about physical therapy. I hope you get better quickly.

## 2021-07-28 ENCOUNTER — Encounter: Payer: Self-pay | Admitting: Family Medicine

## 2021-07-28 NOTE — Assessment & Plan Note (Signed)
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  Offered PT.  Patient declines.  And she wants to go to work tomorrow.  Short course of tramadol.  Also topical voltarin gel.

## 2021-07-28 NOTE — Progress Notes (Signed)
° ° °  SUBJECTIVE:   CHIEF COMPLAINT / HPI:   71 yo female fell ~ 1 week ago at work.  Tripped (no syncope or lightheadedness), fell forward.  Caught herself with her arms.  Low back was jarred but no direct trauma.  She has a history of sciatica, but no current leg pain.  Denies change in bowel or bladder.  Denies weakness or numbness.   Not taking tinzanidine because did not help. Reviewed xrays done on 1/19.  Neck pain after the fall has resolved.  Lumbar spine series shows extensive arthritis.  Surprisingly, prior to the fall, she did not have daily back pain.  She was surprised when I told her about the extensive LS spine arthritis.    OBJECTIVE:   BP (!) 153/68    Pulse 64    Ht 5' (1.524 m)    Wt 166 lb 12.8 oz (75.7 kg)    SpO2 100%    BMI 32.58 kg/m   Some spastic scoliosis.  Bilateral paraspinous muscle pain over entire lumbar region.  Gait seems normal but slow.  Does have limited ROM of back.   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  Offered PT.  Patient declines.  And she wants to go to work tomorrow.  Short course of tramadol.  Also topical voltarin gel.     Zenia Resides, MD [place]

## 2021-07-30 ENCOUNTER — Other Ambulatory Visit: Payer: Self-pay | Admitting: Family Medicine

## 2021-07-30 DIAGNOSIS — I10 Essential (primary) hypertension: Secondary | ICD-10-CM

## 2021-09-19 ENCOUNTER — Other Ambulatory Visit: Payer: Self-pay | Admitting: Family Medicine

## 2021-11-07 ENCOUNTER — Other Ambulatory Visit: Payer: Self-pay | Admitting: Family Medicine

## 2021-11-07 DIAGNOSIS — E785 Hyperlipidemia, unspecified: Secondary | ICD-10-CM

## 2021-11-14 ENCOUNTER — Ambulatory Visit (INDEPENDENT_AMBULATORY_CARE_PROVIDER_SITE_OTHER): Payer: PPO | Admitting: Family Medicine

## 2021-11-14 ENCOUNTER — Encounter: Payer: Self-pay | Admitting: Family Medicine

## 2021-11-14 VITALS — BP 147/68 | HR 60 | Ht 60.0 in | Wt 165.6 lb

## 2021-11-14 DIAGNOSIS — M7989 Other specified soft tissue disorders: Secondary | ICD-10-CM | POA: Diagnosis not present

## 2021-11-14 DIAGNOSIS — I10 Essential (primary) hypertension: Secondary | ICD-10-CM | POA: Diagnosis not present

## 2021-11-14 MED ORDER — IRBESARTAN 75 MG PO TABS: ORAL_TABLET | ORAL | 0 refills | Status: DC

## 2021-11-14 NOTE — Assessment & Plan Note (Signed)
-  BP 147/68, refilled irbesartan ?-continue coreg bid  ?-follow up in 1 week for BP check and BMP ?

## 2021-11-14 NOTE — Patient Instructions (Addendum)
It was great seeing you today! ? ?Today we discussed your blood pressure, it may be high because you have not been able to take your irbesartan. I have refilled this, please take this daily as prescribed along with carvedilol. ? ?Regarding your leg swelling, please elevate your legs and wear compression socks to help the blood flow. I have stopped your amlodipine, this can cause leg swelling so please do not take amlodipine anymore.  ? ?Please follow up at your next scheduled appointment in 1 week, if anything arises between now and then, please don't hesitate to contact our office. ? ? ?Thank you for allowing [place] to be a part of your medical care! ? ?Thank you, ?Dr. Larae Grooms  ?

## 2021-11-14 NOTE — Progress Notes (Signed)
? ? ?  SUBJECTIVE:  ? ?Hypertension ?Denies chest pain or other symptoms. Endorses compliance of all medications except her irbesartan as she ran out a few weeks ago.  ? ?Foot swelling  ?Reports bilateral foot swelling that has been ongoing for the past few months. It comes and goes, not really getting worse. Denies chest pain and dyspnea. Denies other symptoms. Has been taking the amlodipine for a few years, it is not a new medication. ? ?OBJECTIVE:  ? ?BP (!) 147/68   Pulse 60   Ht 5' (1.524 m)   Wt 165 lb 9.6 oz (75.1 kg)   SpO2 100%   BMI 32.34 kg/m?   ?General: Patient well-appearing, in no acute distress. ?CV: RRR, no murmurs or gallops auscultated ?Resp: CTAB, no wheezing, rales or rhonchi noted ?Ext: pitting pedal edema noted bilaterally with trace LE edema noted bilaterally ? ?ASSESSMENT/PLAN:  ? ?HTN (hypertension), benign ?-BP 147/68, refilled irbesartan ?-continue coreg bid  ?-follow up in 1 week for BP check and BMP ? ?Foot swelling ?-edema possibly secondary to amlodipine, discontinued amlodipine  ?-possibly due to the normal aging process, encouraged wearing compression socks and elevation of legs  ?-follow up in 1 week to observe progression  ?  ?-PHQ-9 score of 0 reviewed.  ? ?Donney Dice, DO ?[place]  ?

## 2021-11-14 NOTE — Assessment & Plan Note (Signed)
-  edema possibly secondary to amlodipine, discontinued amlodipine  ?-possibly due to the normal aging process, encouraged wearing compression socks and elevation of legs  ?-follow up in 1 week to observe progression  ?

## 2021-11-30 ENCOUNTER — Ambulatory Visit (INDEPENDENT_AMBULATORY_CARE_PROVIDER_SITE_OTHER): Payer: PPO | Admitting: Family Medicine

## 2021-11-30 ENCOUNTER — Encounter: Payer: Self-pay | Admitting: Family Medicine

## 2021-11-30 VITALS — BP 198/87 | HR 76 | Ht 60.0 in | Wt 164.6 lb

## 2021-11-30 DIAGNOSIS — I10 Essential (primary) hypertension: Secondary | ICD-10-CM

## 2021-11-30 MED ORDER — LOSARTAN POTASSIUM 25 MG PO TABS: 25.0000 mg | ORAL_TABLET | Freq: Every day | ORAL | 3 refills | Status: DC

## 2021-11-30 NOTE — Assessment & Plan Note (Signed)
-  BP 198/81, on repeat 198/87. Concerning that BP is very high and seems to also be the case at home. Interesting that patient has experienced this high of a BP as she did not have this prior to restarting irbesartan. Patient is not thrilled about continuing irbesartan for this reason, I explained to her that this class of medication provides great renoprotective effects especially with her DM diagnosis. After shared decision making, patient agreed to start losartan 25 mg. Discontinued irbesartan.  -pending BMP after restarting ARB to monitor for hyperkalemia -discussed appropriate method of checking BP at home, instructed to maintain a BP log and bring this record into her next visit  -patient reassuringly asymptomatic, strict ED precautions discussed  -follow up in 2 weeks

## 2021-11-30 NOTE — Progress Notes (Signed)
    SUBJECTIVE:   CHIEF COMPLAINT / HPI:   Patient presents with hypertension follow up. She has been compliant on coreg and irbesartan. Restarted prior irbesartan and says that since she started this her blood pressures have been very high, much higher than they normally have. Blood pressures at home ranged between 412-878 systolic and 67-672C diastolic since restarting irbesartan. Prior to restarting this her BP was 947S systolic at its highest but mostly 130-150s. Denies headache, vision changes, chest pain, shortness of breath and leg swelling. Swelling of foot improved since last visit. No personal stressors.   OBJECTIVE:   BP (!) 198/87   Pulse 76   Ht 5' (1.524 m)   Wt 164 lb 9.6 oz (74.7 kg)   SpO2 100%   BMI 32.15 kg/m   General: Patient well-appearing, in no acute distress. CV: RRR, no murmurs or gallops auscultated Resp: CTAB, no wheezing, rales or rhonchi noted Ext: no LE edema noted bilaterally Psych: mood appropriate, pleasant   ASSESSMENT/PLAN:   HTN (hypertension), benign -BP 198/81, on repeat 198/87. Concerning that BP is very high and seems to also be the case at home. Interesting that patient has experienced this high of a BP as she did not have this prior to restarting irbesartan. Patient is not thrilled about continuing irbesartan for this reason, I explained to her that this class of medication provides great renoprotective effects especially with her DM diagnosis. After shared decision making, patient agreed to start losartan 25 mg. Discontinued irbesartan.  -pending BMP after restarting ARB to monitor for hyperkalemia -discussed appropriate method of checking BP at home, instructed to maintain a BP log and bring this record into her next visit  -patient reassuringly asymptomatic, strict ED precautions discussed  -follow up in 2 weeks    -PHQ-9 score of 0 reviewed.   Donney Dice, [place]

## 2021-11-30 NOTE — Patient Instructions (Addendum)
It was great seeing you today!  Today we discussed your blood pressure, it was very elevated much more than it was previously. Please stop taking irbesartan, instead take losartan 25 mg daily. This is a very low dose. Also take coreg daily. Make sure to check blood pressures at least twice daily and record them. Bring this record into your next visit. We also got blood work to check on your electrolyte levels, I will let you know of any abnormal results regarding this.  If you experience a high blood pressure where the top number is above 180 and you also have headaches, vision changes, chest pain or shortness of breath then please go to the emergency department.   Please follow up at your next scheduled appointment in 2 weeks, if anything arises between now and then, please don't hesitate to contact our office.   Thank you for allowing [place] to be a part of your medical care!  Thank you, Dr. Larae Grooms

## 2021-12-01 ENCOUNTER — Encounter: Payer: Self-pay | Admitting: Family Medicine

## 2021-12-01 LAB — BASIC METABOLIC PANEL
BUN/Creatinine Ratio: 9 — ABNORMAL LOW (ref 12–28)
BUN: 10 mg/dL (ref 8–27)
CO2: 21 mmol/L (ref 20–29)
Calcium: 9.7 mg/dL (ref 8.7–10.3)
Chloride: 102 mmol/L (ref 96–106)
Creatinine, Ser: 1.13 mg/dL — ABNORMAL HIGH (ref 0.57–1.00)
Glucose: 120 mg/dL — ABNORMAL HIGH (ref 70–99)
Potassium: 4.3 mmol/L (ref 3.5–5.2)
Sodium: 139 mmol/L (ref 134–144)
eGFR: 51 mL/min/{1.73_m2} — ABNORMAL LOW (ref >59)

## 2021-12-20 ENCOUNTER — Other Ambulatory Visit: Payer: Self-pay | Admitting: Family Medicine

## 2021-12-20 DIAGNOSIS — I10 Essential (primary) hypertension: Secondary | ICD-10-CM

## 2022-01-02 ENCOUNTER — Other Ambulatory Visit: Payer: Self-pay

## 2022-01-02 ENCOUNTER — Emergency Department (HOSPITAL_COMMUNITY): Payer: PPO

## 2022-01-02 ENCOUNTER — Encounter (HOSPITAL_COMMUNITY): Payer: Self-pay | Admitting: Pharmacy Technician

## 2022-01-02 ENCOUNTER — Emergency Department (HOSPITAL_COMMUNITY)
Admission: EM | Admit: 2022-01-02 | Discharge: 2022-01-02 | Disposition: A | Discharge status: Home / Self Care | Payer: PPO | Attending: Emergency Medicine | Admitting: Emergency Medicine

## 2022-01-02 DIAGNOSIS — Z20822 Contact with and (suspected) exposure to covid-19: Secondary | ICD-10-CM | POA: Diagnosis not present

## 2022-01-02 DIAGNOSIS — M25559 Pain in unspecified hip: Secondary | ICD-10-CM | POA: Insufficient documentation

## 2022-01-02 DIAGNOSIS — Z9012 Acquired absence of left breast and nipple: Secondary | ICD-10-CM | POA: Diagnosis not present

## 2022-01-02 DIAGNOSIS — R519 Headache, unspecified: Secondary | ICD-10-CM | POA: Diagnosis not present

## 2022-01-02 DIAGNOSIS — M79605 Pain in left leg: Secondary | ICD-10-CM | POA: Insufficient documentation

## 2022-01-02 DIAGNOSIS — M545 Low back pain, unspecified: Secondary | ICD-10-CM | POA: Diagnosis not present

## 2022-01-02 DIAGNOSIS — I10 Essential (primary) hypertension: Secondary | ICD-10-CM | POA: Insufficient documentation

## 2022-01-02 DIAGNOSIS — R790 Abnormal level of blood mineral: Secondary | ICD-10-CM | POA: Insufficient documentation

## 2022-01-02 DIAGNOSIS — E119 Type 2 diabetes mellitus without complications: Secondary | ICD-10-CM | POA: Insufficient documentation

## 2022-01-02 DIAGNOSIS — Z7984 Long term (current) use of oral hypoglycemic drugs: Secondary | ICD-10-CM | POA: Insufficient documentation

## 2022-01-02 DIAGNOSIS — R0602 Shortness of breath: Secondary | ICD-10-CM | POA: Insufficient documentation

## 2022-01-02 DIAGNOSIS — R079 Chest pain, unspecified: Secondary | ICD-10-CM

## 2022-01-02 DIAGNOSIS — M79604 Pain in right leg: Secondary | ICD-10-CM | POA: Diagnosis not present

## 2022-01-02 DIAGNOSIS — R0789 Other chest pain: Secondary | ICD-10-CM | POA: Diagnosis present

## 2022-01-02 DIAGNOSIS — R110 Nausea: Secondary | ICD-10-CM | POA: Insufficient documentation

## 2022-01-02 DIAGNOSIS — Z853 Personal history of malignant neoplasm of breast: Secondary | ICD-10-CM | POA: Insufficient documentation

## 2022-01-02 DIAGNOSIS — Z87891 Personal history of nicotine dependence: Secondary | ICD-10-CM | POA: Diagnosis not present

## 2022-01-02 DIAGNOSIS — K5792 Diverticulitis of intestine, part unspecified, without perforation or abscess without bleeding: Secondary | ICD-10-CM

## 2022-01-02 LAB — CBC
HCT: 42.6 % (ref 36.0–46.0)
Hemoglobin: 13.6 g/dL (ref 12.0–15.0)
MCH: 27.5 pg (ref 26.0–34.0)
MCHC: 31.9 g/dL (ref 30.0–36.0)
MCV: 86.1 fL (ref 80.0–100.0)
Platelets: 282 10*3/uL (ref 150–400)
RBC: 4.95 MIL/uL (ref 3.87–5.11)
RDW: 14.5 % (ref 11.5–15.5)
WBC: 8.8 10*3/uL (ref 4.0–10.5)
nRBC: 0 % (ref 0.0–0.2)

## 2022-01-02 LAB — COMPREHENSIVE METABOLIC PANEL
ALT: 20 U/L (ref 0–44)
AST: 27 U/L (ref 15–41)
Albumin: 3.8 g/dL (ref 3.5–5.0)
Alkaline Phosphatase: 128 U/L — ABNORMAL HIGH (ref 38–126)
Anion gap: 11 (ref 5–15)
BUN: 15 mg/dL (ref 8–23)
CO2: 26 mmol/L (ref 22–32)
Calcium: 9.4 mg/dL (ref 8.9–10.3)
Chloride: 97 mmol/L — ABNORMAL LOW (ref 98–111)
Creatinine, Ser: 1.39 mg/dL — ABNORMAL HIGH (ref 0.44–1.00)
GFR, Estimated: 40 mL/min — ABNORMAL LOW (ref >60)
Glucose, Bld: 124 mg/dL — ABNORMAL HIGH (ref 70–99)
Potassium: 3.6 mmol/L (ref 3.5–5.1)
Sodium: 134 mmol/L — ABNORMAL LOW (ref 135–145)
Total Bilirubin: 2.6 mg/dL — ABNORMAL HIGH (ref 0.3–1.2)
Total Protein: 7.5 g/dL (ref 6.5–8.1)

## 2022-01-02 LAB — RESP PANEL BY RT-PCR (FLU A&B, COVID) ARPGX2
Influenza A by PCR: NEGATIVE
Influenza B by PCR: NEGATIVE
SARS Coronavirus 2 by RT PCR: NEGATIVE

## 2022-01-02 LAB — TROPONIN I (HIGH SENSITIVITY)
Troponin I (High Sensitivity): 25 ng/L — ABNORMAL HIGH (ref <18)
Troponin I (High Sensitivity): 26 ng/L — ABNORMAL HIGH (ref <18)

## 2022-01-02 LAB — LIPASE, BLOOD: Lipase: 36 U/L (ref 11–51)

## 2022-01-02 LAB — BRAIN NATRIURETIC PEPTIDE: B Natriuretic Peptide: 191.6 pg/mL — ABNORMAL HIGH (ref 0.0–100.0)

## 2022-01-02 MED ORDER — ONDANSETRON HCL 4 MG/2ML IJ SOLN
4.0000 mg | Freq: Once | INTRAMUSCULAR | Status: AC
  Administered 2022-01-02: 4 mg via INTRAVENOUS
  Filled 2022-01-02: qty 2

## 2022-01-02 MED ORDER — IBUPROFEN 600 MG PO TABS: 600.0000 mg | ORAL_TABLET | Freq: Four times a day (QID) | ORAL | 0 refills | Status: DC | PRN

## 2022-01-02 MED ORDER — HYDRALAZINE HCL 20 MG/ML IJ SOLN
10.0000 mg | Freq: Once | INTRAMUSCULAR | Status: AC
  Administered 2022-01-02: 10 mg via INTRAVENOUS
  Filled 2022-01-02: qty 1

## 2022-01-02 MED ORDER — SUCRALFATE 1 G PO TABS: 1.0000 g | ORAL_TABLET | Freq: Three times a day (TID) | ORAL | 0 refills | Status: DC

## 2022-01-02 MED ORDER — SODIUM CHLORIDE 0.9 % IV BOLUS
1000.0000 mL | Freq: Once | INTRAVENOUS | Status: AC
Start: 2022-01-02 — End: 2022-01-02
  Administered 2022-01-02: 1000 mL via INTRAVENOUS

## 2022-01-02 MED ORDER — CARVEDILOL 12.5 MG PO TABS
25.0000 mg | ORAL_TABLET | Freq: Once | ORAL | Status: AC
  Administered 2022-01-02: 25 mg via ORAL
  Filled 2022-01-02: qty 2

## 2022-01-02 MED ORDER — IOHEXOL 350 MG/ML SOLN
100.0000 mL | Freq: Once | INTRAVENOUS | Status: AC | PRN
  Administered 2022-01-02: 80 mL via INTRAVENOUS

## 2022-01-02 MED ORDER — MORPHINE SULFATE (PF) 2 MG/ML IV SOLN
2.0000 mg | Freq: Once | INTRAVENOUS | Status: AC
  Administered 2022-01-02: 2 mg via INTRAVENOUS
  Filled 2022-01-02: qty 1

## 2022-01-02 MED ORDER — AMOXICILLIN-POT CLAVULANATE 875-125 MG PO TABS
1.0000 | ORAL_TABLET | Freq: Two times a day (BID) | ORAL | 0 refills | Status: DC
Start: 2022-01-02 — End: 2022-01-20

## 2022-01-02 MED ORDER — ACETAMINOPHEN 325 MG PO TABS
650.0000 mg | ORAL_TABLET | Freq: Once | ORAL | Status: AC
Start: 2022-01-02 — End: 2022-01-02
  Administered 2022-01-02: 650 mg via ORAL
  Filled 2022-01-02: qty 2

## 2022-01-02 MED ORDER — LOSARTAN POTASSIUM 50 MG PO TABS
25.0000 mg | ORAL_TABLET | Freq: Once | ORAL | Status: AC
  Administered 2022-01-02: 25 mg via ORAL
  Filled 2022-01-02: qty 1

## 2022-01-02 MED ORDER — ACETAMINOPHEN 325 MG PO TABS: 650.0000 mg | ORAL_TABLET | Freq: Four times a day (QID) | ORAL | 0 refills | Status: DC | PRN

## 2022-01-02 NOTE — ED Provider Notes (Signed)
[facility] EMERGENCY DEPARTMENT Provider Note   CSN: [id number] Arrival date & time: 01/02/22  0710     History  Chief Complaint  Patient presents with   Chest Pain   Back Pain   Hip Pain    Ashley Sloan is a 71 y.o. female.  Patient as above with significant medical history as below, including HTN DM who presents to the ED with complaint of cheat pain, abd pain left, leg pain. Onset Friday afternoon with the chest pain, described as left sided/cramping - felt like a "muscle cramp." Having some mild dib with the cp. Now pain has progressed to her LLQ and her right leg. She has not had similar pain in the past. Having mild nausea w/o emesis. No change to bowel or bladder fxn. No numbness or tingling, no ha or head injury. Did not take her BP meds this am.      Past Medical History:  Diagnosis Date   Arthritis    hands   Cancer ([place]) 1991   Breast-left   Diabetes mellitus    Hyperlipidemia    Hypertension    Left breast mass     Past Surgical History:  Procedure Laterality Date   BREAST EXCISIONAL BIOPSY Left    benign   BREAST LUMPECTOMY Left 1991   BREAST SURGERY  1991   Lumpectomy   CESAREAN SECTION  1968   CYSTOSCOPY WITH URETEROSCOPY AND STENT PLACEMENT Right 10/11/2020   Procedure: CYSTOSCOPY WITH URETEROSCOPY AND STENT PLACEMENT/ RETROGRADE PYELOGRAM/ BIOPSY;  Surgeon: Raynelle Bring, MD;  Location: [facility];  Service: Urology;  Laterality: Right;   MASS EXCISION Left 11/19/2017   Procedure: EXCISION LEFT BREAST  MASS ERAS PATHWAY;  Surgeon: Excell Seltzer, MD;  Location: [facility];  Service: General;  Laterality: Left;   ROBOT ASSITED LAPAROSCOPIC NEPHROURETERECTOMY Right 12/06/2020   Procedure: XI ROBOT ASSITED LAPAROSCOPIC NEPHROURETERECTOMY WITH POST OPERTIVE INSTILLATION OF INTRAVESICAL GEMCITABINE;  Surgeon: Raynelle Bring, MD;  Location: [facility];  Service: Urology;  Laterality: Right;     The history is provided by the  patient. No language interpreter was used.  Chest Pain Associated symptoms: abdominal pain, back pain and headache   Associated symptoms: no cough, no dysphagia, no fever, no nausea, no palpitations, no shortness of breath and no vomiting   Back Pain Associated symptoms: abdominal pain, chest pain and headaches   Associated symptoms: no fever   Hip Pain Associated symptoms include chest pain, abdominal pain and headaches. Pertinent negatives include no shortness of breath.       Home Medications Prior to Admission medications   Medication Sig Start Date End Date Taking? Authorizing Provider  acetaminophen (TYLENOL) 325 MG tablet Take 2 tablets (650 mg total) by mouth every 6 (six) hours as needed. 01/02/22  Yes Wynona Dove A, DO  amoxicillin-clavulanate (AUGMENTIN) 875-125 MG tablet Take 1 tablet by mouth every 12 (twelve) hours. 01/02/22  Yes Wynona Dove A, DO  atorvastatin (LIPITOR) 40 MG tablet Take 1 tablet (40 mg total) by mouth daily. 11/07/21  Yes Ganta, Anupa, DO  carvedilol (COREG) 25 MG tablet TAKE 1 TABLET(25 MG) BY MOUTH TWICE DAILY WITH A MEAL Patient taking differently: Take 25 mg by mouth 2 (two) times daily with a meal. 03/14/21  Yes Ganta, Anupa, DO  clobetasol ointment (TEMOVATE) 0.05 % Apply twice daily to irritated areas over the groin. Patient taking differently: Apply 1 Application topically 2 (two) times daily as needed (itching). Apply to groin 03/21/21  Yes Dameron, Luna Fuse, DO  diclofenac Sodium (VOLTAREN) 1 % GEL Apply 2 g topically 4 (four) times daily. Patient taking differently: Apply 1 Application topically daily as needed (knee pain). 07/27/21  Yes Hensel, Jamal Collin, MD  ibuprofen (ADVIL) 600 MG tablet Take 1 tablet (600 mg total) by mouth every 6 (six) hours as needed. 01/02/22  Yes Wynona Dove A, DO  losartan (COZAAR) 25 MG tablet Take 1 tablet (25 mg total) by mouth daily. 11/30/21  Yes Ganta, Anupa, DO  metFORMIN (GLUCOPHAGE) 1000 MG tablet TAKE 1 TABLET(1000 MG)  BY MOUTH TWICE DAILY WITH A MEAL Patient taking differently: Take 1,000 mg by mouth See admin instructions. Take 1000 mg by mouth 1-2 times a day 12/09/20  Yes Ganta, Anupa, DO  sucralfate (CARAFATE) 1 g tablet Take 1 tablet (1 g total) by mouth 4 (four) times daily -  with meals and at bedtime for 7 days. 01/02/22 01/09/22 Yes Jeanell Sparrow, DO  traMADol (ULTRAM) 50 MG tablet Take 50 mg by mouth daily as needed (pain). 09/19/21  Yes [provider]      Allergies    Patient has no known allergies.    Review of Systems   Review of Systems  Constitutional:  Negative for chills and fever.  HENT:  Negative for facial swelling and trouble swallowing.   Eyes:  Negative for photophobia and visual disturbance.  Respiratory:  Negative for cough and shortness of breath.   Cardiovascular:  Positive for chest pain. Negative for palpitations.  Gastrointestinal:  Positive for abdominal pain. Negative for nausea and vomiting.  Endocrine: Negative for polydipsia and polyuria.  Genitourinary:  Negative for difficulty urinating and hematuria.  Musculoskeletal:  Positive for back pain. Negative for gait problem and joint swelling.  Skin:  Negative for pallor and rash.  Neurological:  Positive for headaches. Negative for syncope.  Psychiatric/Behavioral:  Negative for agitation and confusion.     Physical Exam Updated Vital Signs BP (!) 174/75 (BP Location: Left Arm)   Pulse 75   Temp 98.6 F (37 C) (Oral)   Resp 13   SpO2 99%  Physical Exam Vitals and nursing note reviewed.  Constitutional:      General: She is not in acute distress.    Appearance: Normal appearance. She is well-developed. She is not ill-appearing or diaphoretic.  HENT:     Head: Normocephalic and atraumatic. No raccoon eyes, Battle's sign, right periorbital erythema or left periorbital erythema.     Right Ear: External ear normal.     Left Ear: External ear normal.     Nose: Nose normal.     Mouth/Throat:     Mouth:  Mucous membranes are moist.  Eyes:     General: No scleral icterus.       Right eye: No discharge.        Left eye: No discharge.     Pupils: Pupils are equal, round, and reactive to light.  Cardiovascular:     Rate and Rhythm: Normal rate and regular rhythm.     Pulses: Normal pulses.          Radial pulses are 2+ on the right side and 2+ on the left side.       Dorsalis pedis pulses are 2+ on the right side and 2+ on the left side.     Heart sounds: Normal heart sounds.     No S3 or S4 sounds.  Pulmonary:     Effort: Pulmonary effort is normal.  No respiratory distress.     Breath sounds: Normal breath sounds. No decreased breath sounds or wheezing.  Abdominal:     General: Abdomen is flat.     Palpations: Abdomen is soft.     Tenderness: There is abdominal tenderness in the left lower quadrant.       Comments: Mild ttp to llq Not peritoneal   Musculoskeletal:        General: Normal range of motion.     Cervical back: Normal range of motion.     Right lower leg: No edema.     Left lower leg: No edema.  Skin:    General: Skin is warm and dry.     Capillary Refill: Capillary refill takes less than 2 seconds.  Neurological:     Mental Status: She is alert and oriented to person, place, and time.     GCS: GCS eye subscore is 4. GCS verbal subscore is 5. GCS motor subscore is 6.     Cranial Nerves: Cranial nerves 2-12 are intact. No dysarthria or facial asymmetry.     Sensory: Sensation is intact.     Motor: Motor function is intact. No tremor.     Coordination: Coordination is intact. Romberg sign negative.     Gait: Gait is intact.  Psychiatric:        Mood and Affect: Mood normal.        Behavior: Behavior normal.     ED Results / Procedures / Treatments   Labs (all labs ordered are listed, but only abnormal results are displayed) Labs Reviewed  COMPREHENSIVE METABOLIC PANEL - Abnormal; Notable for the following components:      Result Value   Sodium 134 (*)     Chloride 97 (*)    Glucose, Bld 124 (*)    Creatinine, Ser 1.39 (*)    Alkaline Phosphatase 128 (*)    Total Bilirubin 2.6 (*)    GFR, Estimated 40 (*)    All other components within normal limits  BRAIN NATRIURETIC PEPTIDE - Abnormal; Notable for the following components:   B Natriuretic Peptide 191.6 (*)    All other components within normal limits  TROPONIN I (HIGH SENSITIVITY) - Abnormal; Notable for the following components:   Troponin I (High Sensitivity) 25 (*)    All other components within normal limits  TROPONIN I (HIGH SENSITIVITY) - Abnormal; Notable for the following components:   Troponin I (High Sensitivity) 26 (*)    All other components within normal limits  RESP PANEL BY RT-PCR (FLU A&B, COVID) ARPGX2  CBC  LIPASE, BLOOD    EKG EKG Interpretation  Date/Time:  Monday January 02 2022 07:21:08 EDT Ventricular Rate:  75 PR Interval:  136 QRS Duration: 106 QT Interval:  426 QTC Calculation: 475 R Axis:   -45 Text Interpretation: Normal sinus rhythm Pulmonary disease pattern Incomplete right bundle branch block Left anterior fascicular block Moderate voltage criteria for LVH, may be normal variant ( R in aVL , Cornell product ) Abnormal ECG When compared with ECG of 21-Jul-2021 08:51, PREVIOUS ECG IS PRESENT Interpretation limited secondary to artifact similar to prior Confirmed by Wynona Dove ([id number]) on 01/02/2022 1:20:45 PM  Radiology CT Angio Chest/Abd/Pel for Dissection W and/or Wo Contrast  Result Date: 01/02/2022 CLINICAL DATA:  Left-sided chest and back pain with shortness of breath. EXAM: CT ANGIOGRAPHY CHEST, ABDOMEN AND PELVIS TECHNIQUE: Non-contrast CT of the chest was initially obtained. Multidetector CT imaging through the chest, abdomen and pelvis was  performed using the standard protocol during bolus administration of intravenous contrast. Multiplanar reconstructed images and MIPs were obtained and reviewed to evaluate the vascular anatomy. RADIATION DOSE  REDUCTION: This exam was performed according to the departmental dose-optimization program which includes automated exposure control, adjustment of the mA and/or kV according to patient size and/or use of iterative reconstruction technique. CONTRAST:  65m OMNIPAQUE IOHEXOL 350 MG/ML SOLN COMPARISON:  Chest x-ray from same day. CT chest, abdomen, and pelvis dated August 12, 2021. FINDINGS: CTA CHEST FINDINGS Cardiovascular: No thoracic aortic intramural hematoma on noncontrast imaging. Preferential opacification of the thoracic aorta. No evidence of thoracic aortic aneurysm or dissection. Coronary, aortic arch, and branch vessel atherosclerotic vascular disease. Normal heart size. No pericardial effusion. No pulmonary embolism. Mediastinum/Nodes: No enlarged mediastinal, hilar, or axillary lymph nodes. Prior left axillary lymph node dissection. Thyroid gland, trachea, and esophagus demonstrate no significant findings. Lungs/Pleura: Lungs are clear. No pleural effusion or pneumothorax. Unchanged 3 mm nodule in the left lower lobe (series 7, image 63), stable since March 2022, benign. No follow-up imaging is recommended. Musculoskeletal: No chest wall abnormality. No acute or significant osseous findings. Review of the MIP images confirms the above findings. CTA ABDOMEN AND PELVIS FINDINGS VASCULAR Aorta: Normal caliber aorta without aneurysm, dissection, vasculitis or significant stenosis. Heavy atherosclerotic calcification. Celiac: Patent without evidence of aneurysm, dissection, vasculitis or significant stenosis. SMA: Patent without evidence of aneurysm, dissection, vasculitis or significant stenosis. Renals: Left renal artery is patent without evidence of aneurysm, dissection, vasculitis, fibromuscular dysplasia or significant stenosis. IMA: Patent without evidence of aneurysm, dissection, vasculitis or significant stenosis. Inflow: Patent without evidence of aneurysm, dissection, vasculitis or significant  stenosis. Veins: No obvious venous abnormality within the limitations of this arterial phase study. Review of the MIP images confirms the above findings. NON-VASCULAR Hepatobiliary: No focal liver abnormality is seen. No gallstones, gallbladder wall thickening, or biliary dilatation. Pancreas: Unremarkable. No pancreatic ductal dilatation or surrounding inflammatory changes. Spleen: Normal in size without focal abnormality. Adrenals/Urinary Tract: The adrenal glands are unremarkable. Prior right nephrectomy. Unchanged 3.2 cm simple cyst in the left kidney. No follow-up imaging is recommended. No renal calculi or hydronephrosis. The bladder is unremarkable. Stomach/Bowel: Small hiatal hernia. The stomach is otherwise within normal limits. Diffuse moderate colonic diverticulosis with focal circumferential wall thickening of the mid descending colon adjacent to an inflamed diverticulum (series 5, image 191). No extraluminal air or fluid collection. The small bowel is unremarkable. Normal appendix. Lymphatic: No enlarged abdominal or pelvic lymph nodes. Reproductive: Uterus and bilateral adnexa are unremarkable. Other: Trace free fluid in the pelvis.  No pneumoperitoneum. Musculoskeletal: No acute or significant osseous findings. Unchanged bone island in the right femoral head. Review of the MIP images confirms the above findings. IMPRESSION: 1. Acute uncomplicated diverticulitis of the mid descending colon. 2. No evidence of acute aortic syndrome or aneurysm. 3. Aortic Atherosclerosis (ICD10-I70.0). Electronically Signed   By: WTitus DubinM.D.   On: 01/02/2022 09:58   CT Head Wo Contrast  Result Date: 01/02/2022 CLINICAL DATA:  Provided history: Headache, new or worsening. EXAM: CT HEAD WITHOUT CONTRAST TECHNIQUE: Contiguous axial images were obtained from the base of the skull through the vertex without intravenous contrast. RADIATION DOSE REDUCTION: This exam was performed according to the departmental  dose-optimization program which includes automated exposure control, adjustment of the mA and/or kV according to patient size and/or use of iterative reconstruction technique. COMPARISON:  Head CT 07/21/2021. FINDINGS: Brain: No age advanced or lobar predominant parenchymal atrophy.  Multiple somewhat rounded dural-based foci of calcification along the left frontal lobe convexity, similar to the prior head CT of 07/21/2021 and measuring up to 6 mm (for instance as seen on series 5, image 26). These may reflect dural calcifications and/or small partially calcified meningiomas. No significant mass effect upon the underlying brain parenchyma. Partially empty sella turcica. There is no acute intracranial hemorrhage. No demarcated cortical infarct. No extra-axial fluid collection. No midline shift. Vascular: No hyperdense vessel.  Atherosclerotic calcifications. Skull: No fracture or aggressive osseous lesion. Sinuses/Orbits: No mass or acute finding within the imaged orbits. No significant paranasal sinus disease. IMPRESSION: No acute intracranial hemorrhage or evidence of acute infarct. Multiple dural-based foci of calcification along the left frontal lobe convexity, measuring up to 6 mm. These may reflect dural calcifications and/or small partially calcified meningiomas. No significant mass effect upon the underlying brain parenchyma. Electronically Signed   By: Kellie Simmering D.O.   On: 01/02/2022 09:53   DG Chest Port 1 View  Result Date: 01/02/2022 CLINICAL DATA:  Chest pain. EXAM: PORTABLE CHEST 1 VIEW COMPARISON:  Chest radiograph 07/21/2021. FINDINGS: No consolidation. No visible pleural effusions or pneumothorax cardiomediastinal silhouette is mildly enlarged. Left axillary clips. Polyarticular degenerative change. IMPRESSION: No evidence of acute cardiopulmonary disease. Electronically Signed   By: Margaretha Sheffield M.D.   On: 01/02/2022 07:51    Procedures .Critical Care  Performed by: Jeanell Sparrow,  DO Authorized by: Jeanell Sparrow, DO   Critical care provider statement:    Critical care time (minutes):  32   Critical care time was exclusive of:  Separately billable procedures and treating other patients   Critical care was necessary to treat or prevent imminent or life-threatening deterioration of the following conditions:  Cardiac failure   Critical care was time spent personally by me on the following activities:  Development of treatment plan with patient or surrogate, discussions with consultants, evaluation of patient's response to treatment, examination of patient, ordering and review of laboratory studies, ordering and review of radiographic studies, ordering and performing treatments and interventions, pulse oximetry, re-evaluation of patient's condition, review of old charts and obtaining history from patient or surrogate     Medications Ordered in ED Medications  hydrALAZINE (APRESOLINE) injection 10 mg (10 mg Intravenous Given 01/02/22 0754)  losartan (COZAAR) tablet 25 mg (25 mg Oral Given 01/02/22 0754)  carvedilol (COREG) tablet 25 mg (25 mg Oral Given 01/02/22 0753)  morphine (PF) 2 MG/ML injection 2 mg (2 mg Intravenous Given 01/02/22 0754)  ondansetron (ZOFRAN) injection 4 mg (4 mg Intravenous Given 01/02/22 0754)  sodium chloride 0.9 % bolus 1,000 mL (0 mLs Intravenous Stopped 01/02/22 1213)  iohexol (OMNIPAQUE) 350 MG/ML injection 100 mL (80 mLs Intravenous Contrast Given 01/02/22 0926)  acetaminophen (TYLENOL) tablet 650 mg (650 mg Oral Given 01/02/22 1225)    ED Course/ Medical Decision Making/ A&P                           Medical Decision Making Amount and/or Complexity of Data Reviewed Labs: ordered. Radiology: ordered.  Risk OTC drugs. Prescription drug management.    CC: chest pain, abd pain, leg pain  This patient presents to the Emergency Department for the above complaint. This involves an extensive number of treatment options and is a complaint that carries  with it a high risk of complications and morbidity. Vital signs were reviewed. Serious etiologies considered.  Differential includes all life-threatening causes for  chest pain. This includes but is not exclusive to acute coronary syndrome, aortic dissection, pulmonary embolism, cardiac tamponade, community-acquired pneumonia, pericarditis, musculoskeletal chest wall pain, etc.  Differential diagnosis includes but is not exclusive to ectopic pregnancy, ovarian cyst, ovarian torsion, acute appendicitis, urinary tract infection, endometriosis, bowel obstruction, hernia, colitis, renal colic, gastroenteritis, volvulus etc.  Record review:  Previous records obtained and reviewed prior ED visits, prior labs and imaging, medications  Additional history obtained from [facility] and surgical history as noted above.   Work up as above, notable for:  Labs & imaging results that were available during my care of the patient were visualized by me and considered in my medical decision making.  Physical exam as above.   I ordered imaging studies which included CT dissection, CTH, chest x-ray. I visualized the imaging, interpreted images, and I agree with radiologist interpretation. Uncomplicated diverticulitis.  Imaging otherwise unremarkable.  Cardiac monitoring reviewed and interpreted personally which shows NSR  Patient's blood pressure is elevated, 214/95, she did not take her home antihypertensives.  Reports blood pressures been elevated past couple weeks despite taking her antihypertensives.  She has no palpitations, no numbness or tingling.  No vision changes.  She does have a mild headache.  Will obtain CT imaging.  This was unremarkable.  Blood pressure improved after home medications and one-time dose of IV hydralazine.  Headache is resolved.  Cr is mildly elevated from baseline, give IVF  Patient is mildly elevated troponin, troponin is flat.  Burtis Junes this is likely demand ischemia in setting of  poorly controlled hypertension.  Vies patient take her home antihypertensives as prescribed.  We will give patient outpatient follow-up with cardiology given she has a heart score of 4.  She has no chest pain.  Last time she experienced chest pain was yesterday.  No dyspnea.   Management: Analgesic, antihypertensive  ED Course:     Reassessment:  Patient symptoms have improved.  She is tolerant p.o. intake without difficulty.  No nausea or vomiting.  No chest pain or dyspnea.  She is HDS.  Admission was considered.   Patient symptoms are likely secondary to poorly controlled hypertension due to noncompliance and uncomplicated diverticulitis.  Start oral antibiotics.  Recommend liquid diet.  She has colonoscopy scheduled for next month.  Advised to follow with PCP and cardiology in the interim. She has moderate risk chest pain, no chest pain while in the ED. Her trop is flat, her ECG is without acute ischemic changes, no syncope or near syncope; overall is feeling much better. HEART score is 4. Will provide outpatient cardiology follow up.    The patient improved significantly and was discharged in stable condition. Detailed discussions were had with the patient regarding current findings, and need for close f/u with PCP or on call doctor. The patient has been instructed to return immediately if the symptoms worsen in any way for re-evaluation. Patient verbalized understanding and is in agreement with current care plan. All questions answered prior to discharge.               Social determinants of health include -  Social History   Socioeconomic History   Marital status: Widowed    Spouse name: Not on file   Number of children: 1   Years of education: 12   Highest education level: High school graduate  Occupational History   Not on file  Tobacco Use   Smoking status: Former    Packs/day: 0.50  Years: 25.00    Total pack years: 12.50    Types: Cigarettes    Start  date: 07/03/1964    Quit date: 12/18/1982    Years since quitting: 39.0   Smokeless tobacco: Never  Vaping Use   Vaping Use: Never used  Substance and Sexual Activity   Alcohol use: No    Alcohol/week: 0.0 standard drinks of alcohol   Drug use: No   Sexual activity: Yes    Birth control/protection: Post-menopausal  Other Topics Concern   Not on file  Social History Narrative   Patient lives alone in [place].    Patient has one daughter, she is very close with her.    Patient is a widow ~3 years.   Patient is still working as a Chartered certified accountant.    Patient enjoys reading and watching TV.   Social Determinants of Health   Financial Resource Strain: Not on file  Food Insecurity: Not on file  Transportation Needs: Not on file  Physical Activity: Inactive (01/21/2020)   Exercise Vital Sign    Days of Exercise per Week: 0 days    Minutes of Exercise per Session: 0 min  Stress: Not on file  Social Connections: Not on file  Intimate Partner Violence: Not on file      This chart was dictated using voice recognition software.  Despite best efforts to proofread,  errors can occur which can change the documentation meaning.         Final Clinical Impression(s) / ED Diagnoses Final diagnoses:  Moderate risk chest pain  Diverticulitis  Poorly-controlled hypertension    Rx / DC Orders ED Discharge Orders          Ordered    amoxicillin-clavulanate (AUGMENTIN) 875-125 MG tablet  Every 12 hours        01/02/22 1409    sucralfate (CARAFATE) 1 g tablet  3 times daily with meals & bedtime        01/02/22 1410    acetaminophen (TYLENOL) 325 MG tablet  Every 6 hours PRN        01/02/22 1410    ibuprofen (ADVIL) 600 MG tablet  Every 6 hours PRN        01/02/22 1410    Ambulatory referral to Cardiology        01/02/22 1410              Wynona Dove A, DO 01/02/22 1414

## 2022-01-02 NOTE — Discharge Instructions (Addendum)
It was a pleasure caring for you today in the emergency department.  Please return to the emergency department for any worsening or worrisome symptoms.   If you develop severe chest pain, difficulty with breathing, palpitations, other worsening or worrisome symptoms please return to the emergency department.   Please take your home antihypertensives/blood pressure medicine as prescribed.

## 2022-01-02 NOTE — ED Triage Notes (Signed)
Pt here with reports of L sided chest "cramping" along with some shob, L back pain and L hip pain for the last 2 days.

## 2022-01-19 NOTE — Progress Notes (Signed)
Cardiology Office Note:    Date:  01/20/2022   ID:  Ashley Sloan, DOB 1950/08/18, MRN [id number]  PCP:  Donney Dice, DO  Cardiologist:  Sinclair Grooms, MD   Referring MD: Jeanell Sparrow, DO   Chief Complaint  Patient presents with   Coronary Artery Disease   Hypertension   Advice Only    EKG with LVH    History of Present Illness:    Ashley Sloan is a 71 y.o. female with a hx of DM II, primary hypertension, hyperlipidemia referred from ER for evaluation of chest pain.   She is here for evaluation of chest discomfort which she complained of when she went to the emergency room on July 3 with abdominal pain and a flare of diverticulitis.  She is referred because of the complaint of vague chest discomfort.  Her blood pressure was very high.  A CT scan was done to rule out aortic dissection and did demonstrate coronary calcification.  The chest discomfort did not cause any nausea or sweating.  Physical activity does not bring the discomfort home.  Episode lasted less than 20 minutes.  Cardiac markers were unremarkable.  EKG did not reveal any acute ischemic change.  Past Medical History:  Diagnosis Date   Arthritis    hands   Cancer ([place]) 1991   Breast-left   Diabetes mellitus    Hyperlipidemia    Hypertension    Left breast mass     Past Surgical History:  Procedure Laterality Date   BREAST EXCISIONAL BIOPSY Left    benign   BREAST LUMPECTOMY Left 1991   BREAST SURGERY  1991   Lumpectomy   CESAREAN SECTION  1968   CYSTOSCOPY WITH URETEROSCOPY AND STENT PLACEMENT Right 10/11/2020   Procedure: CYSTOSCOPY WITH URETEROSCOPY AND STENT PLACEMENT/ RETROGRADE PYELOGRAM/ BIOPSY;  Surgeon: Raynelle Bring, MD;  Location: [facility];  Service: Urology;  Laterality: Right;   MASS EXCISION Left 11/19/2017   Procedure: EXCISION LEFT BREAST  MASS ERAS PATHWAY;  Surgeon: Excell Seltzer, MD;  Location: [facility];  Service: General;  Laterality: Left;   ROBOT ASSITED  LAPAROSCOPIC NEPHROURETERECTOMY Right 12/06/2020   Procedure: XI ROBOT ASSITED LAPAROSCOPIC NEPHROURETERECTOMY WITH POST OPERTIVE INSTILLATION OF INTRAVESICAL GEMCITABINE;  Surgeon: Raynelle Bring, MD;  Location: [facility];  Service: Urology;  Laterality: Right;    Current Medications: Current Meds  Medication Sig   acetaminophen (TYLENOL) 325 MG tablet Take 2 tablets (650 mg total) by mouth every 6 (six) hours as needed.   atorvastatin (LIPITOR) 40 MG tablet Take 1 tablet (40 mg total) by mouth daily.   carvedilol (COREG) 25 MG tablet TAKE 1 TABLET(25 MG) BY MOUTH TWICE DAILY WITH A MEAL   clobetasol ointment (TEMOVATE) 0.05 % Apply twice daily to irritated areas over the groin.   diclofenac Sodium (VOLTAREN) 1 % GEL Apply 2 g topically 4 (four) times daily.   ibuprofen (ADVIL) 600 MG tablet Take 1 tablet (600 mg total) by mouth every 6 (six) hours as needed.   losartan (COZAAR) 25 MG tablet Take 1 tablet (25 mg total) by mouth daily.   metFORMIN (GLUCOPHAGE) 1000 MG tablet TAKE 1 TABLET(1000 MG) BY MOUTH TWICE DAILY WITH A MEAL   spironolactone (ALDACTONE) 25 MG tablet Take 0.5 tablets (12.5 mg total) by mouth daily.     Allergies:   Patient has no known allergies.   Social History   Socioeconomic History   Marital status: Widowed    Spouse name:  Not on file   Number of children: 1   Years of education: 12   Highest education level: High school graduate  Occupational History   Not on file  Tobacco Use   Smoking status: Former    Packs/day: 0.50    Years: 25.00    Total pack years: 12.50    Types: Cigarettes    Start date: 07/03/1964    Quit date: 12/18/1982    Years since quitting: 39.1   Smokeless tobacco: Never  Vaping Use   Vaping Use: Never used  Substance and Sexual Activity   Alcohol use: No    Alcohol/week: 0.0 standard drinks of alcohol   Drug use: No   Sexual activity: Yes    Birth control/protection: Post-menopausal  Other Topics Concern   Not on file  Social  History Narrative   Patient lives alone in [place].    Patient has one daughter, she is very close with her.    Patient is a widow ~3 years.   Patient is still working as a Chartered certified accountant.    Patient enjoys reading and watching TV.   Social Determinants of Health   Financial Resource Strain: Low Risk  (01/21/2020)   Overall Financial Resource Strain (CARDIA)    Difficulty of Paying Living Expenses: Not very hard  Food Insecurity: No Food Insecurity (01/21/2020)   Hunger Vital Sign    Worried About Running Out of Food in the Last Year: Never true    Ran Out of Food in the Last Year: Never true  Transportation Needs: No Transportation Needs (01/21/2020)   PRAPARE - Hydrologist (Medical): No    Lack of Transportation (Non-Medical): No  Physical Activity: Inactive (01/21/2020)   Exercise Vital Sign    Days of Exercise per Week: 0 days    Minutes of Exercise per Session: 0 min  Stress: No Stress Concern Present (01/21/2020)   [place]    Feeling of Stress : Only a little  Social Connections: Moderately Integrated (01/21/2020)   Social Connection and Isolation Panel [NHANES]    Frequency of Communication with Friends and Family: More than three times a week    Frequency of Social Gatherings with Friends and Family: More than three times a week    Attends Religious Services: More than 4 times per year    Active Member of [employer] or Organizations: Yes    Attends Archivist Meetings: More than 4 times per year    Marital Status: Widowed     Family History: The patient's family history includes COPD in her mother; Diabetes in her daughter and mother; Heart disease in her sister; Hypertension in her brother, father, and mother.  ROS:   Please see the history of present illness.    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  She is up and walking all day without episodes of chest  discomfort.  All other systems reviewed and are negative.  EKGs/Labs/Other Studies Reviewed:    The following studies were reviewed today:  Chest CT r/o Aortic Dissection 01/02/2022: Cardiovascular: No thoracic aortic intramural hematoma on noncontrast imaging. Preferential opacification of the thoracic aorta. No evidence of thoracic aortic aneurysm or dissection. Coronary, aortic arch, and branch vessel atherosclerotic vascular disease. Normal heart size. No pericardial effusion. No pulmonary embolism. IMPRESSION: 1. Acute uncomplicated diverticulitis of the mid descending colon. 2. No evidence of acute aortic syndrome or aneurysm. 3. Aortic Atherosclerosis (  ICD10-I70.0). 4. Coronary atherosclerosis  EKG:  EKG performed 01/02/2022 revealed LAHB and RBBB.  Left ventricular hypertrophy.  A new tracing is not performed  Recent Labs: 01/02/2022: ALT 20; B Natriuretic Peptide 191.6; BUN 15; Creatinine, Ser 1.39; Hemoglobin 13.6; Platelets 282; Potassium 3.6; Sodium 134  Recent Lipid Panel    Component Value Date/Time   CHOL 117 02/02/2020 1617   TRIG 64 02/02/2020 1617   HDL 54 02/02/2020 1617   CHOLHDL 2.2 02/02/2020 1617   CHOLHDL 2.5 03/17/2016 1405   VLDL 23 03/17/2016 1405   LDLCALC 49 02/02/2020 1617   LDLDIRECT 106 03/10/2015 1557    Physical Exam:    VS:  BP (!) 188/94   Pulse 62   Ht 5' (1.524 m)   Wt 157 lb 12.8 oz (71.6 kg)   SpO2 96%   BMI 30.82 kg/m     Wt Readings from Last 3 Encounters:  01/20/22 157 lb 12.8 oz (71.6 kg)  11/30/21 164 lb 9.6 oz (74.7 kg)  11/14/21 165 lb 9.6 oz (75.1 kg)     GEN: Elderly. No acute distress HEENT: Normal NECK: No JVD. LYMPHATICS: No lymphadenopathy CARDIAC: No murmur. RRR and S4 but no S3 gallop, or edema. VASCULAR:  Normal Pulses. No bruits. RESPIRATORY:  Clear to auscultation without rales, wheezing or rhonchi  ABDOMEN: Soft, non-tender, non-distended, No pulsatile mass, MUSCULOSKELETAL: No deformity  SKIN: Warm  and dry NEUROLOGIC:  Alert and oriented x 3 PSYCHIATRIC:  Normal affect   ASSESSMENT:    1. Chest pain of uncertain etiology   2. Type 2 diabetes mellitus with other circulatory complication, unspecified whether long term insulin use (HCC)   3. Other hyperlipidemia   4. HTN (hypertension), benign   5. Left anterior hemiblock    PLAN:    In order of problems listed above:  Uncertain etiology.  Possibly related to elevated blood pressure.  Negative cardiac markers.  EKG suggests left ventricular hypertrophy.  Discomfort could have been related to pressure overload.  2D Doppler echocardiogram. A1c is 6.9.  She is at risk for developing CAD. LDL cholesterol is being controlled on high intensity atorvastatin.  Most recent LDL is 49. Very poor blood pressure control.  Not on a diuretic because she had gout on HCTZ.  She has only 1 kidney.  Start spironolactone 12.5 mg daily.  Bmet in 1 week.  Clinical follow-up in 3 to 4 weeks.  Echocardiogram prior prior to return. Left anterior hemiblock with voltage criteria for LVH.  2D Doppler echocardiogram.   Medication Adjustments/Labs and Tests Ordered: Current medicines are reviewed at length with the patient today.  Concerns regarding medicines are outlined above.  Orders Placed This Encounter  Procedures   Basic metabolic panel   ECHOCARDIOGRAM COMPLETE   Meds ordered this encounter  Medications   spironolactone (ALDACTONE) 25 MG tablet    Sig: Take 0.5 tablets (12.5 mg total) by mouth daily.    Dispense:  45 tablet    Refill:  3    Patient Instructions  Medication Instructions:  Your physician has recommended you make the following change in your medication:  1) START Spironolactone 12.'5mg'$  daily  *If you need a refill on your cardiac medications before your next appointment, please call your pharmacy*  Lab Work: In 1 week: BMET If you have labs (blood work) drawn today and your tests are completely normal, you will receive your  results only by: [place] (if you have MyChart) OR A paper copy in the mail  If you have any lab test that is abnormal or we need to change your treatment, we will call you to review the results.  Testing/Procedures: Your physician has requested that you have an echocardiogram prior to next office visit in 3-4 weeks. Echocardiography is a painless test that uses sound waves to create images of your heart. It provides your doctor with information about the size and shape of your heart and how well your heart's chambers and valves are working. This procedure takes approximately one hour. There are no restrictions for this procedure.  Follow-Up: At [facility], you and your health needs are our priority.  As part of our continuing mission to provide you with exceptional heart care, we have created designated Provider Care Teams.  These Care Teams include your primary Cardiologist (physician) and Advanced Practice Providers (APPs -  Physician Assistants and Nurse Practitioners) who all work together to provide you with the care you need, when you need it.  Your next appointment:   3-4 week(s)  The format for your next appointment:   In Person  Provider:   Sinclair Grooms, MD {   Important Information About Sugar         Signed, Sinclair Grooms, MD  01/20/2022 11:28 AM    [place]

## 2022-01-20 ENCOUNTER — Ambulatory Visit: Payer: PPO | Admitting: Interventional Cardiology

## 2022-01-20 ENCOUNTER — Encounter: Payer: Self-pay | Admitting: Interventional Cardiology

## 2022-01-20 VITALS — BP 188/94 | HR 62 | Ht 60.0 in | Wt 157.8 lb

## 2022-01-20 DIAGNOSIS — I10 Essential (primary) hypertension: Secondary | ICD-10-CM | POA: Diagnosis not present

## 2022-01-20 DIAGNOSIS — E1159 Type 2 diabetes mellitus with other circulatory complications: Secondary | ICD-10-CM | POA: Diagnosis not present

## 2022-01-20 DIAGNOSIS — E7849 Other hyperlipidemia: Secondary | ICD-10-CM

## 2022-01-20 DIAGNOSIS — R079 Chest pain, unspecified: Secondary | ICD-10-CM

## 2022-01-20 DIAGNOSIS — I444 Left anterior fascicular block: Secondary | ICD-10-CM

## 2022-01-20 MED ORDER — SPIRONOLACTONE 25 MG PO TABS: 12.5000 mg | ORAL_TABLET | Freq: Every day | ORAL | 3 refills | Status: DC

## 2022-01-20 NOTE — Patient Instructions (Signed)
Medication Instructions:  Your physician has recommended you make the following change in your medication:  1) START Spironolactone 12.'5mg'$  daily  *If you need a refill on your cardiac medications before your next appointment, please call your pharmacy*  Lab Work: In 1 week: BMET If you have labs (blood work) drawn today and your tests are completely normal, you will receive your results only by: [place] (if you have MyChart) OR A paper copy in the mail If you have any lab test that is abnormal or we need to change your treatment, we will call you to review the results.  Testing/Procedures: Your physician has requested that you have an echocardiogram prior to next office visit in 3-4 weeks. Echocardiography is a painless test that uses sound waves to create images of your heart. It provides your doctor with information about the size and shape of your heart and how well your heart's chambers and valves are working. This procedure takes approximately one hour. There are no restrictions for this procedure.  Follow-Up: At [facility], you and your health needs are our priority.  As part of our continuing mission to provide you with exceptional heart care, we have created designated Provider Care Teams.  These Care Teams include your primary Cardiologist (physician) and Advanced Practice Providers (APPs -  Physician Assistants and Nurse Practitioners) who all work together to provide you with the care you need, when you need it.  Your next appointment:   3-4 week(s)  The format for your next appointment:   In Person  Provider:   Sinclair Grooms, MD {   Important Information About Sugar

## 2022-01-23 ENCOUNTER — Telehealth: Payer: Self-pay | Admitting: Interventional Cardiology

## 2022-01-23 NOTE — Telephone Encounter (Signed)
Returned call to patient, she states she is unable to break spironolactone in half. Advised patient to purchase a pill cutter to help split the pill as they only come in '25mg'$  tablets.   Patient will call if she continues to have issues splitting this pill to get correct dose of 12.'5mg'$  QD.

## 2022-01-23 NOTE — Telephone Encounter (Signed)
  Pt c/o medication issue:  1. Name of Medication: spironolactone (ALDACTONE) 25 MG tablet  2. How are you currently taking this medication (dosage and times per day)? Take 0.5 tablets (12.5 mg total) by mouth daily. 3. Are you having a reaction (difficulty breathing--STAT)? No   4. What is your medication issue? Pt said, she needs to take half a tablet a day but she cant break the medications in half. She doesn't know what to do

## 2022-01-27 ENCOUNTER — Other Ambulatory Visit: Payer: PPO

## 2022-01-27 DIAGNOSIS — I10 Essential (primary) hypertension: Secondary | ICD-10-CM

## 2022-01-28 LAB — BASIC METABOLIC PANEL
BUN/Creatinine Ratio: 9 — ABNORMAL LOW (ref 12–28)
BUN: 14 mg/dL (ref 8–27)
CO2: 24 mmol/L (ref 20–29)
Calcium: 9.4 mg/dL (ref 8.7–10.3)
Chloride: 102 mmol/L (ref 96–106)
Creatinine, Ser: 1.54 mg/dL — ABNORMAL HIGH (ref 0.57–1.00)
Glucose: 106 mg/dL — ABNORMAL HIGH (ref 70–99)
Potassium: 3.9 mmol/L (ref 3.5–5.2)
Sodium: 140 mmol/L (ref 134–144)
eGFR: 35 mL/min/{1.73_m2} — ABNORMAL LOW (ref >59)

## 2022-01-30 ENCOUNTER — Telehealth: Payer: Self-pay | Admitting: Interventional Cardiology

## 2022-01-30 NOTE — Telephone Encounter (Signed)
Returned call to patient and discussed lab results.  Per Dr. Tamala Julian: Let the patient know there is suggestion of mild kidney stress.  Increase fluid intake.  Continue to monitor blood pressure.  Potassium is okay.  Blood work is similar to 1 month ago.  Repeat basic metabolic panel in 6 weeks.    Patient verbalized understanding. Offered to make lap appt for F/U BMET while on the phone, patient declined and states she will call back to schedule.

## 2022-01-30 NOTE — Telephone Encounter (Signed)
Patient was returning call for results. Please advise °

## 2022-02-06 ENCOUNTER — Ambulatory Visit (HOSPITAL_COMMUNITY): Payer: PPO | Attending: Cardiovascular Disease

## 2022-02-06 DIAGNOSIS — R079 Chest pain, unspecified: Secondary | ICD-10-CM

## 2022-02-06 LAB — ECHOCARDIOGRAM COMPLETE
Area-P 1/2: 4.05 cm2
MV M vel: 7.37 m/s
MV Peak grad: 217 mmHg
MV VTI: 0.73 cm2
Radius: 1.5 cm
S' Lateral: 2.5 cm

## 2022-02-08 ENCOUNTER — Telehealth: Payer: Self-pay | Admitting: Interventional Cardiology

## 2022-02-08 NOTE — Telephone Encounter (Signed)
Returned call to patient, she is asking if it is OK for her to proceed with colonoscopy scheduled for tomorrow.  Informed patient if clearance from cardiology was needed a clearance request would need to be sent to our office, but because patient is not taking any blood thinners she should be OK to go ahead with colonoscopy.   Patient verbalized understanding and expressed appreciation for call.

## 2022-02-08 NOTE — Telephone Encounter (Signed)
Patient is requesting to speak with Drue Dun, RN. She states she has a colonoscopy scheduled for tomorrow and she would like to discuss it with her prior if possible.

## 2022-02-13 ENCOUNTER — Other Ambulatory Visit (HOSPITAL_COMMUNITY): Payer: Self-pay

## 2022-02-14 NOTE — Progress Notes (Unsigned)
Cardiology Office Note:    Date:  02/15/2022   ID:  Ashley Sloan, DOB September 23, 1950, MRN [id number]  PCP:  Donney Dice, DO  Cardiologist:  Sinclair Grooms, MD   Referring MD: Donney Dice, DO   Chief Complaint  Patient presents with   Coronary Artery Disease   Hypertension   Hyperlipidemia    History of Present Illness:    Ashley Sloan is a 71 y.o. female with a hx of DM II, primary hypertension, hyperlipidemia referred from ER for evaluation of chest pain.  Ashley Sloan feels well.  She did have successful colonoscopy done last week.  She denies chest pain, dyspnea, lower extremity swelling, and other complaints.  Her blood pressure has been extremely elevated.  HCTZ had been discontinued by primary care and blood pressure really elevated.  She is back today for follow-up.  The patient denies orthopnea and PND.  No prolonged palpitations.  No prior history of rheumatic heart disease or rheumatic fever.  Past Medical History:  Diagnosis Date   Arthritis    hands   Cancer ([place]) 1991   Breast-left   Diabetes mellitus    Hyperlipidemia    Hypertension    Left breast mass     Past Surgical History:  Procedure Laterality Date   BREAST EXCISIONAL BIOPSY Left    benign   BREAST LUMPECTOMY Left 1991   BREAST SURGERY  1991   Lumpectomy   CESAREAN SECTION  1968   CYSTOSCOPY WITH URETEROSCOPY AND STENT PLACEMENT Right 10/11/2020   Procedure: CYSTOSCOPY WITH URETEROSCOPY AND STENT PLACEMENT/ RETROGRADE PYELOGRAM/ BIOPSY;  Surgeon: Raynelle Bring, MD;  Location: [facility];  Service: Urology;  Laterality: Right;   MASS EXCISION Left 11/19/2017   Procedure: EXCISION LEFT BREAST  MASS ERAS PATHWAY;  Surgeon: Excell Seltzer, MD;  Location: [facility];  Service: General;  Laterality: Left;   ROBOT ASSITED LAPAROSCOPIC NEPHROURETERECTOMY Right 12/06/2020   Procedure: XI ROBOT ASSITED LAPAROSCOPIC NEPHROURETERECTOMY WITH POST OPERTIVE INSTILLATION OF INTRAVESICAL GEMCITABINE;   Surgeon: Raynelle Bring, MD;  Location: [facility];  Service: Urology;  Laterality: Right;    Current Medications: Current Meds  Medication Sig   acetaminophen (TYLENOL) 325 MG tablet Take 2 tablets (650 mg total) by mouth every 6 (six) hours as needed.   amLODipine (NORVASC) 2.5 MG tablet Take 1 tablet (2.5 mg total) by mouth daily.   atorvastatin (LIPITOR) 40 MG tablet Take 1 tablet (40 mg total) by mouth daily.   carvedilol (COREG) 25 MG tablet TAKE 1 TABLET(25 MG) BY MOUTH TWICE DAILY WITH A MEAL   clobetasol ointment (TEMOVATE) 0.05 % Apply twice daily to irritated areas over the groin.   diclofenac Sodium (VOLTAREN) 1 % GEL Apply 2 g topically 4 (four) times daily.   ibuprofen (ADVIL) 600 MG tablet Take 1 tablet (600 mg total) by mouth every 6 (six) hours as needed.   losartan (COZAAR) 25 MG tablet Take 1 tablet (25 mg total) by mouth daily.   metFORMIN (GLUCOPHAGE) 1000 MG tablet TAKE 1 TABLET(1000 MG) BY MOUTH TWICE DAILY WITH A MEAL   spironolactone (ALDACTONE) 25 MG tablet Take 1 tablet (25 mg total) by mouth daily.   [DISCONTINUED] spironolactone (ALDACTONE) 25 MG tablet Take 0.5 tablets (12.5 mg total) by mouth daily.     Allergies:   Patient has no known allergies.   Social History   Socioeconomic History   Marital status: Widowed    Spouse name: Not on file   Number of children:  1   Years of education: 53   Highest education level: High school graduate  Occupational History   Not on file  Tobacco Use   Smoking status: Former    Packs/day: 0.50    Years: 25.00    Total pack years: 12.50    Types: Cigarettes    Start date: 07/03/1964    Quit date: 12/18/1982    Years since quitting: 39.1   Smokeless tobacco: Never  Vaping Use   Vaping Use: Never used  Substance and Sexual Activity   Alcohol use: No    Alcohol/week: 0.0 standard drinks of alcohol   Drug use: No   Sexual activity: Yes    Birth control/protection: Post-menopausal  Other Topics Concern   Not on file   Social History Narrative   Patient lives alone in [place].    Patient has one daughter, she is very close with her.    Patient is a widow ~3 years.   Patient is still working as a Chartered certified accountant.    Patient enjoys reading and watching TV.   Social Determinants of Health   Financial Resource Strain: Low Risk  (01/21/2020)   Overall Financial Resource Strain (CARDIA)    Difficulty of Paying Living Expenses: Not very hard  Food Insecurity: No Food Insecurity (01/21/2020)   Hunger Vital Sign    Worried About Running Out of Food in the Last Year: Never true    Ran Out of Food in the Last Year: Never true  Transportation Needs: No Transportation Needs (01/21/2020)   PRAPARE - Hydrologist (Medical): No    Lack of Transportation (Non-Medical): No  Physical Activity: Inactive (01/21/2020)   Exercise Vital Sign    Days of Exercise per Week: 0 days    Minutes of Exercise per Session: 0 min  Stress: No Stress Concern Present (01/21/2020)   [place]    Feeling of Stress : Only a little  Social Connections: Moderately Integrated (01/21/2020)   Social Connection and Isolation Panel [NHANES]    Frequency of Communication with Friends and Family: More than three times a week    Frequency of Social Gatherings with Friends and Family: More than three times a week    Attends Religious Services: More than 4 times per year    Active Member of [employer] or Organizations: Yes    Attends Archivist Meetings: More than 4 times per year    Marital Status: Widowed     Family History: The patient's family history includes COPD in her mother; Diabetes in her daughter and mother; Heart disease in her sister; Hypertension in her brother, father, and mother.  ROS:   Please see the history of present illness.    No orthopnea chest pain.  All other systems reviewed and are negative.  EKGs/Labs/Other Studies  Reviewed:    The following studies were reviewed today: 2 D Doppler Echocardiogram 02/06/2022: IMPRESSIONS   1. There is severe mitral valve regurgitation. The leaflets and thickened  and calcified. There is moderate to severe annular calcium. This leaflets  are restricted in systole/diastole (IIIA pathology). This could be related  to rheumatic valve disease or  degenerative calcific disease. TEE is recommended for better  characterization. The mitral valve is degenerative. Severe mitral valve  regurgitation. Mild mitral stenosis. The mean mitral valve gradient is 7.0  mmHg with average heart rate of 73 bpm.  Moderate to severe mitral annular calcification.  2. Left ventricular ejection fraction, by estimation, is 60 to 65%. The  left ventricle has normal function. The left ventricle has no regional  wall motion abnormalities. Left ventricular diastolic function could not  be evaluated.   3. Right ventricular systolic function is normal. The right ventricular  size is normal. There is severely elevated pulmonary artery systolic  pressure. The estimated right ventricular systolic pressure is 62.6 mmHg.   4. Left atrial size was mild to moderately dilated.   5. Right atrial size was mildly dilated.   6. Tricuspid valve regurgitation is mild to moderate.   7. The aortic valve is tricuspid. Aortic valve regurgitation is not  visualized. No aortic stenosis is present.   8. The inferior vena cava is normal in size with <50% respiratory  variability, suggesting right atrial pressure of 8 mmHg.   EKG:  EKG not repeated  Recent Labs: 01/02/2022: ALT 20; B Natriuretic Peptide 191.6; Hemoglobin 13.6; Platelets 282 01/27/2022: BUN 14; Creatinine, Ser 1.54; Potassium 3.9; Sodium 140  Recent Lipid Panel    Component Value Date/Time   CHOL 117 02/02/2020 1617   TRIG 64 02/02/2020 1617   HDL 54 02/02/2020 1617   CHOLHDL 2.2 02/02/2020 1617   CHOLHDL 2.5 03/17/2016 1405   VLDL 23 03/17/2016  1405   LDLCALC 49 02/02/2020 1617   LDLDIRECT 106 03/10/2015 1557    Physical Exam:    VS:  BP (!) 178/95   Pulse 62   Ht 5' (1.524 m)   Wt 156 lb (70.8 kg)   SpO2 98%   BMI 30.47 kg/m     Wt Readings from Last 3 Encounters:  02/15/22 156 lb (70.8 kg)  01/20/22 157 lb 12.8 oz (71.6 kg)  11/30/21 164 lb 9.6 oz (74.7 kg)     GEN: Healthy appearing. No acute distress HEENT: Normal NECK: No JVD. LYMPHATICS: No lymphadenopathy CARDIAC: There is a left lateral chest and axillary holosystolic mitral regurgitation murmur. RRR no gallop, or edema. VASCULAR:  Normal Pulses. No bruits. RESPIRATORY:  Clear to auscultation without rales, wheezing or rhonchi  ABDOMEN: Soft, non-tender, non-distended, No pulsatile mass, MUSCULOSKELETAL: No deformity  SKIN: Warm and dry NEUROLOGIC:  Alert and oriented x 3 PSYCHIATRIC:  Normal affect   ASSESSMENT:    1. HTN (hypertension), benign   2. Rheumatic mitral valve disease   3. Pulmonary hypertension, unspecified ([place])   4. Type 2 diabetes mellitus with other circulatory complication, unspecified whether long term insulin use (HCC)   5. Other hyperlipidemia   6. Left anterior hemiblock   7. Chest pain of uncertain etiology    PLAN:    In order of problems listed above:  Increase spironolactone to 25 mg/day.  Basic metabolic panel in 7 to 10 days.  I encouraged patient to follow the Dash diet.  Add amlodipine 2.5 mg/day.  Follow-up in blood pressure clinic in 4 weeks.  Further optimization of amlodipine at that time and if need be, intensify ARB assuming kidney function and potassium allow. Echo demonstrates calcified mitral annulus and leaflets.  Severe mitral regurgitation is heard.  I missed her murmur on the last office visit because it is heard almost exclusively in the left axilla.  Some of the features suggest the possibility of combined mitral stenosis and mitral regurgitation.  Poorly controlled systemic blood pressure is also  aggravating the severity of MR.  LV size and function are normal.  Plan to concentrate on lowering the blood pressure.  May repeat the echocardiogram in  the next 3 to 6 months.  May need to be considered for mitral valve surgery sooner rather than later given the level of pulmonary hypertension. Secondary to mitral regurgitation.  This needs to be followed closely. Continue metformin and consider SGLT2. Continue atorvastatin. No comment.  No tracing today. No recurrence of chest pain.  I wonder if the episode of chest pain was associated in any way with mitral regurgitation.  No mention of flail leaflet on echocardiogram.   25-monthfollow-up.  Informed of my retirement.  She will be assigned a cardiologist.   Target BP: <130/80 mmHg  Diet and lifestyle measures for BP control were reviewed in detail: Low sodium diet (<2.5 gm daily); alcohol restriction (<3 ounces per day); weight loss (Mediterranean); avoid non-steroidal agents; > 6 hours sleep per day; 150 min moderate exercise per week. Medical regimen will include at least 2 agents. Resistant hypertension if not controlled on 3 agents. Consider further evaluation: Sleep study to r/o OSA; Renal angiogram; Primary hyperaldonism and Pheochromocytoma w/u. After 3 agents, consider MRA (spironolactone)/ Epleronone), hydralazine, beta-blocker, and Minoxidil if not already in use due to patient profile.    Medication Adjustments/Labs and Tests Ordered: Current medicines are reviewed at length with the patient today.  Concerns regarding medicines are outlined above.  Orders Placed This Encounter  Procedures   Basic metabolic panel   AMB Referral to [facility] Pharm-D   Meds ordered this encounter  Medications   amLODipine (NORVASC) 2.5 MG tablet    Sig: Take 1 tablet (2.5 mg total) by mouth daily.    Dispense:  90 tablet    Refill:  3   spironolactone (ALDACTONE) 25 MG tablet    Sig: Take 1 tablet (25 mg total) by mouth daily.     Dispense:  90 tablet    Refill:  3    Dose changed    Patient Instructions  Medication Instructions:  Your physician has recommended you make the following change in your medication:   1) START Amlodipine 2.'5mg'$  daily 2) INCREASE Spironolactone to '25mg'$  daily  *If you need a refill on your cardiac medications before your next appointment, please call your pharmacy*  Lab Work: In 7-10 days: BMET If you have labs (blood work) drawn today and your tests are completely normal, you will receive your results only by: [facility](if you have MyChart) OR A paper copy in the mail If you have any lab test that is abnormal or we need to change your treatment, we will call you to review the results.  Testing/Procedures: NONE  Follow-Up: At [facility] you and your health needs are our priority.  As part of our continuing mission to provide you with exceptional heart care, we have created designated Provider Care Teams.  These Care Teams include your primary Cardiologist (physician) and Advanced Practice Providers (APPs -  Physician Assistants and Nurse Practitioners) who all work together to provide you with the care you need, when you need it.  Your next appointment:   6-8 month(s)  The format for your next appointment:   In Person  Provider:   HSinclair Grooms MD {  Other Instructions Your physician has referred you to our [facility] they will call you to schedule an appointment.  DASH Eating Plan DASH stands for Dietary Approaches to Stop Hypertension. The DASH eating plan is a healthy eating plan that has been shown to: Reduce high blood pressure (hypertension). Reduce your risk for type 2 diabetes, heart disease,  and stroke. Help with weight loss. What are tips for following this plan? Reading food labels Check food labels for the amount of salt (sodium) per serving. Choose foods with less than 5 percent of the Daily Value of sodium. Generally, foods with  less than 300 milligrams (mg) of sodium per serving fit into this eating plan. To find whole grains, look for the word "whole" as the first word in the ingredient list. Shopping Buy products labeled as "low-sodium" or "no salt added." Buy fresh foods. Avoid canned foods and pre-made or frozen meals. Cooking Avoid adding salt when cooking. Use salt-free seasonings or herbs instead of table salt or sea salt. Check with your health care provider or pharmacist before using salt substitutes. Do not fry foods. Cook foods using healthy methods such as baking, boiling, grilling, roasting, and broiling instead. Cook with heart-healthy oils, such as olive, canola, avocado, soybean, or sunflower oil. Meal planning  Eat a balanced diet that includes: 4 or more servings of fruits and 4 or more servings of vegetables each day. Try to fill one-half of your plate with fruits and vegetables. 6-8 servings of whole grains each day. Less than 6 oz (170 g) of lean meat, poultry, or fish each day. A 3-oz (85-g) serving of meat is about the same size as a deck of cards. One egg equals 1 oz (28 g). 2-3 servings of low-fat dairy each day. One serving is 1 cup (237 mL). 1 serving of nuts, seeds, or beans 5 times each week. 2-3 servings of heart-healthy fats. Healthy fats called omega-3 fatty acids are found in foods such as walnuts, flaxseeds, fortified milks, and eggs. These fats are also found in cold-water fish, such as sardines, salmon, and mackerel. Limit how much you eat of: Canned or prepackaged foods. Food that is high in trans fat, such as some fried foods. Food that is high in saturated fat, such as fatty meat. Desserts and other sweets, sugary drinks, and other foods with added sugar. Full-fat dairy products. Do not salt foods before eating. Do not eat more than 4 egg yolks a week. Try to eat at least 2 vegetarian meals a week. Eat more home-cooked food and less restaurant, buffet, and fast  food. Lifestyle When eating at a restaurant, ask that your food be prepared with less salt or no salt, if possible. If you drink alcohol: Limit how much you use to: 0-1 drink a day for women who are not pregnant. 0-2 drinks a day for men. Be aware of how much alcohol is in your drink. In the [place] one drink equals one 12 oz bottle of beer (355 mL), one 5 oz glass of wine (148 mL), or one 1 oz glass of hard liquor (44 mL). General information Avoid eating more than 2,300 mg of salt a day. If you have hypertension, you may need to reduce your sodium intake to 1,500 mg a day. Work with your health care provider to maintain a healthy body weight or to lose weight. Ask what an ideal weight is for you. Get at least 30 minutes of exercise that causes your heart to beat faster (aerobic exercise) most days of the week. Activities may include walking, swimming, or biking. Work with your health care provider or dietitian to adjust your eating plan to your individual calorie needs. What foods should I eat? Fruits All fresh, dried, or frozen fruit. Canned fruit in natural juice (without added sugar). Vegetables Fresh or frozen vegetables (raw, steamed, roasted, or  grilled). Low-sodium or reduced-sodium tomato and vegetable juice. Low-sodium or reduced-sodium tomato sauce and tomato paste. Low-sodium or reduced-sodium canned vegetables. Grains Whole-grain or whole-wheat bread. Whole-grain or whole-wheat pasta. Brown rice. [place]. Bulgur. Whole-grain and low-sodium cereals. Pita bread. Low-fat, low-sodium crackers. Whole-wheat flour tortillas. Meats and other proteins Skinless chicken or [place]. Ground chicken or [place]. Pork with fat trimmed off. Fish and seafood. Egg whites. Dried beans, peas, or lentils. Unsalted nuts, nut butters, and seeds. Unsalted canned beans. Lean cuts of beef with fat trimmed off. Low-sodium, lean precooked or cured meat, such as sausages or meat loaves. Dairy Low-fat  (1%) or fat-free (skim) milk. Reduced-fat, low-fat, or fat-free cheeses. Nonfat, low-sodium ricotta or cottage cheese. Low-fat or nonfat yogurt. Low-fat, low-sodium cheese. Fats and oils Soft margarine without trans fats. Vegetable oil. Reduced-fat, low-fat, or light mayonnaise and salad dressings (reduced-sodium). Canola, safflower, olive, avocado, soybean, and sunflower oils. Avocado. Seasonings and condiments Herbs. Spices. Seasoning mixes without salt. Other foods Unsalted popcorn and pretzels. Fat-free sweets. The items listed above may not be a complete list of foods and beverages you can eat. Contact a dietitian for more information. What foods should I avoid? Fruits Canned fruit in a light or heavy syrup. Fried fruit. Fruit in cream or butter sauce. Vegetables Creamed or fried vegetables. Vegetables in a cheese sauce. Regular canned vegetables (not low-sodium or reduced-sodium). Regular canned tomato sauce and paste (not low-sodium or reduced-sodium). Regular tomato and vegetable juice (not low-sodium or reduced-sodium). Angie Fava. Olives. Grains Baked goods made with fat, such as croissants, muffins, or some breads. Dry pasta or rice meal packs. Meats and other proteins Fatty cuts of meat. Ribs. Fried meat. Berniece Salines. Bologna, salami, and other precooked or cured meats, such as sausages or meat loaves. Fat from the back of a pig (fatback). Bratwurst. Salted nuts and seeds. Canned beans with added salt. Canned or smoked fish. Whole eggs or egg yolks. Chicken or [place] with skin. Dairy Whole or 2% milk, cream, and half-and-half. Whole or full-fat cream cheese. Whole-fat or sweetened yogurt. Full-fat cheese. Nondairy creamers. Whipped toppings. Processed cheese and cheese spreads. Fats and oils Butter. Stick margarine. Lard. Shortening. Ghee. Bacon fat. Tropical oils, such as coconut, palm kernel, or palm oil. Seasonings and condiments Onion salt, garlic salt, seasoned salt, table salt, and sea  salt. Worcestershire sauce. Tartar sauce. Barbecue sauce. Teriyaki sauce. Soy sauce, including reduced-sodium. Steak sauce. Canned and packaged gravies. Fish sauce. Oyster sauce. Cocktail sauce. Store-bought horseradish. Ketchup. Mustard. Meat flavorings and tenderizers. Bouillon cubes. Hot sauces. Pre-made or packaged marinades. Pre-made or packaged taco seasonings. Relishes. Regular salad dressings. Other foods Salted popcorn and pretzels. The items listed above may not be a complete list of foods and beverages you should avoid. Contact a dietitian for more information. Where to find more information National Heart, Lung, and Blood Institute: https://wilson-eaton.com/ American Heart Association: www.heart.org Academy of Nutrition and Dietetics: www.eatright.[place]: www.kidney.org Summary The DASH eating plan is a healthy eating plan that has been shown to reduce high blood pressure (hypertension). It may also reduce your risk for type 2 diabetes, heart disease, and stroke. When on the DASH eating plan, aim to eat more fresh fruits and vegetables, whole grains, lean proteins, low-fat dairy, and heart-healthy fats. With the DASH eating plan, you should limit salt (sodium) intake to 2,300 mg a day. If you have hypertension, you may need to reduce your sodium intake to 1,500 mg a day. Work with your health care provider or dietitian  to adjust your eating plan to your individual calorie needs. This information is not intended to replace advice given to you by your health care provider. Make sure you discuss any questions you have with your health care provider. Document Revised: 05/23/2019 Document Reviewed: 05/23/2019 Elsevier Patient Education  [place]         Signed, Sinclair Grooms, MD  02/15/2022 12:40 PM    [place]

## 2022-02-15 ENCOUNTER — Encounter: Payer: Self-pay | Admitting: Interventional Cardiology

## 2022-02-15 ENCOUNTER — Ambulatory Visit: Payer: PPO | Admitting: Interventional Cardiology

## 2022-02-15 VITALS — BP 178/95 | HR 62 | Ht 60.0 in | Wt 156.0 lb

## 2022-02-15 DIAGNOSIS — I10 Essential (primary) hypertension: Secondary | ICD-10-CM | POA: Diagnosis not present

## 2022-02-15 DIAGNOSIS — I059 Rheumatic mitral valve disease, unspecified: Secondary | ICD-10-CM | POA: Diagnosis not present

## 2022-02-15 DIAGNOSIS — I272 Pulmonary hypertension, unspecified: Secondary | ICD-10-CM | POA: Diagnosis not present

## 2022-02-15 DIAGNOSIS — R079 Chest pain, unspecified: Secondary | ICD-10-CM

## 2022-02-15 DIAGNOSIS — E7849 Other hyperlipidemia: Secondary | ICD-10-CM

## 2022-02-15 DIAGNOSIS — I444 Left anterior fascicular block: Secondary | ICD-10-CM

## 2022-02-15 DIAGNOSIS — E1159 Type 2 diabetes mellitus with other circulatory complications: Secondary | ICD-10-CM | POA: Diagnosis not present

## 2022-02-15 MED ORDER — AMLODIPINE BESYLATE 2.5 MG PO TABS: 2.5000 mg | ORAL_TABLET | Freq: Every day | ORAL | 3 refills | Status: DC

## 2022-02-15 MED ORDER — SPIRONOLACTONE 25 MG PO TABS: 25.0000 mg | ORAL_TABLET | Freq: Every day | ORAL | 3 refills | Status: DC

## 2022-02-15 NOTE — Patient Instructions (Addendum)
Medication Instructions:  Your physician has recommended you make the following change in your medication:   1) START Amlodipine 2.'5mg'$  daily 2) INCREASE Spironolactone to '25mg'$  daily  *If you need a refill on your cardiac medications before your next appointment, please call your pharmacy*  Lab Work: In 7-10 days: BMET If you have labs (blood work) drawn today and your tests are completely normal, you will receive your results only by: [place] (if you have MyChart) OR A paper copy in the mail If you have any lab test that is abnormal or we need to change your treatment, we will call you to review the results.  Testing/Procedures: NONE  Follow-Up: At [facility], you and your health needs are our priority.  As part of our continuing mission to provide you with exceptional heart care, we have created designated Provider Care Teams.  These Care Teams include your primary Cardiologist (physician) and Advanced Practice Providers (APPs -  Physician Assistants and Nurse Practitioners) who all work together to provide you with the care you need, when you need it.  Your next appointment:   6-8 month(s)  The format for your next appointment:   In Person  Provider:   Sinclair Grooms, MD {  Other Instructions Your physician has referred you to our [facility], they will call you to schedule an appointment.  DASH Eating Plan DASH stands for Dietary Approaches to Stop Hypertension. The DASH eating plan is a healthy eating plan that has been shown to: Reduce high blood pressure (hypertension). Reduce your risk for type 2 diabetes, heart disease, and stroke. Help with weight loss. What are tips for following this plan? Reading food labels Check food labels for the amount of salt (sodium) per serving. Choose foods with less than 5 percent of the Daily Value of sodium. Generally, foods with less than 300 milligrams (mg) of sodium per serving fit into this eating plan. To  find whole grains, look for the word "whole" as the first word in the ingredient list. Shopping Buy products labeled as "low-sodium" or "no salt added." Buy fresh foods. Avoid canned foods and pre-made or frozen meals. Cooking Avoid adding salt when cooking. Use salt-free seasonings or herbs instead of table salt or sea salt. Check with your health care provider or pharmacist before using salt substitutes. Do not fry foods. Cook foods using healthy methods such as baking, boiling, grilling, roasting, and broiling instead. Cook with heart-healthy oils, such as olive, canola, avocado, soybean, or sunflower oil. Meal planning  Eat a balanced diet that includes: 4 or more servings of fruits and 4 or more servings of vegetables each day. Try to fill one-half of your plate with fruits and vegetables. 6-8 servings of whole grains each day. Less than 6 oz (170 g) of lean meat, poultry, or fish each day. A 3-oz (85-g) serving of meat is about the same size as a deck of cards. One egg equals 1 oz (28 g). 2-3 servings of low-fat dairy each day. One serving is 1 cup (237 mL). 1 serving of nuts, seeds, or beans 5 times each week. 2-3 servings of heart-healthy fats. Healthy fats called omega-3 fatty acids are found in foods such as walnuts, flaxseeds, fortified milks, and eggs. These fats are also found in cold-water fish, such as sardines, salmon, and mackerel. Limit how much you eat of: Canned or prepackaged foods. Food that is high in trans fat, such as some fried foods. Food that is high in  saturated fat, such as fatty meat. Desserts and other sweets, sugary drinks, and other foods with added sugar. Full-fat dairy products. Do not salt foods before eating. Do not eat more than 4 egg yolks a week. Try to eat at least 2 vegetarian meals a week. Eat more home-cooked food and less restaurant, buffet, and fast food. Lifestyle When eating at a restaurant, ask that your food be prepared with less salt or  no salt, if possible. If you drink alcohol: Limit how much you use to: 0-1 drink a day for women who are not pregnant. 0-2 drinks a day for men. Be aware of how much alcohol is in your drink. In the [place] one drink equals one 12 oz bottle of beer (355 mL), one 5 oz glass of wine (148 mL), or one 1 oz glass of hard liquor (44 mL). General information Avoid eating more than 2,300 mg of salt a day. If you have hypertension, you may need to reduce your sodium intake to 1,500 mg a day. Work with your health care provider to maintain a healthy body weight or to lose weight. Ask what an ideal weight is for you. Get at least 30 minutes of exercise that causes your heart to beat faster (aerobic exercise) most days of the week. Activities may include walking, swimming, or biking. Work with your health care provider or dietitian to adjust your eating plan to your individual calorie needs. What foods should I eat? Fruits All fresh, dried, or frozen fruit. Canned fruit in natural juice (without added sugar). Vegetables Fresh or frozen vegetables (raw, steamed, roasted, or grilled). Low-sodium or reduced-sodium tomato and vegetable juice. Low-sodium or reduced-sodium tomato sauce and tomato paste. Low-sodium or reduced-sodium canned vegetables. Grains Whole-grain or whole-wheat bread. Whole-grain or whole-wheat pasta. Brown rice. [place]. Bulgur. Whole-grain and low-sodium cereals. Pita bread. Low-fat, low-sodium crackers. Whole-wheat flour tortillas. Meats and other proteins Skinless chicken or [place]. Ground chicken or [place]. Pork with fat trimmed off. Fish and seafood. Egg whites. Dried beans, peas, or lentils. Unsalted nuts, nut butters, and seeds. Unsalted canned beans. Lean cuts of beef with fat trimmed off. Low-sodium, lean precooked or cured meat, such as sausages or meat loaves. Dairy Low-fat (1%) or fat-free (skim) milk. Reduced-fat, low-fat, or fat-free cheeses. Nonfat, low-sodium ricotta  or cottage cheese. Low-fat or nonfat yogurt. Low-fat, low-sodium cheese. Fats and oils Soft margarine without trans fats. Vegetable oil. Reduced-fat, low-fat, or light mayonnaise and salad dressings (reduced-sodium). Canola, safflower, olive, avocado, soybean, and sunflower oils. Avocado. Seasonings and condiments Herbs. Spices. Seasoning mixes without salt. Other foods Unsalted popcorn and pretzels. Fat-free sweets. The items listed above may not be a complete list of foods and beverages you can eat. Contact a dietitian for more information. What foods should I avoid? Fruits Canned fruit in a light or heavy syrup. Fried fruit. Fruit in cream or butter sauce. Vegetables Creamed or fried vegetables. Vegetables in a cheese sauce. Regular canned vegetables (not low-sodium or reduced-sodium). Regular canned tomato sauce and paste (not low-sodium or reduced-sodium). Regular tomato and vegetable juice (not low-sodium or reduced-sodium). Angie Fava. Olives. Grains Baked goods made with fat, such as croissants, muffins, or some breads. Dry pasta or rice meal packs. Meats and other proteins Fatty cuts of meat. Ribs. Fried meat. Berniece Salines. Bologna, salami, and other precooked or cured meats, such as sausages or meat loaves. Fat from the back of a pig (fatback). Bratwurst. Salted nuts and seeds. Canned beans with added salt. Canned or smoked fish. Whole  eggs or egg yolks. Chicken or [place] with skin. Dairy Whole or 2% milk, cream, and half-and-half. Whole or full-fat cream cheese. Whole-fat or sweetened yogurt. Full-fat cheese. Nondairy creamers. Whipped toppings. Processed cheese and cheese spreads. Fats and oils Butter. Stick margarine. Lard. Shortening. Ghee. Bacon fat. Tropical oils, such as coconut, palm kernel, or palm oil. Seasonings and condiments Onion salt, garlic salt, seasoned salt, table salt, and sea salt. Worcestershire sauce. Tartar sauce. Barbecue sauce. Teriyaki sauce. Soy sauce, including  reduced-sodium. Steak sauce. Canned and packaged gravies. Fish sauce. Oyster sauce. Cocktail sauce. Store-bought horseradish. Ketchup. Mustard. Meat flavorings and tenderizers. Bouillon cubes. Hot sauces. Pre-made or packaged marinades. Pre-made or packaged taco seasonings. Relishes. Regular salad dressings. Other foods Salted popcorn and pretzels. The items listed above may not be a complete list of foods and beverages you should avoid. Contact a dietitian for more information. Where to find more information National Heart, Lung, and Blood Institute: https://wilson-eaton.com/ American Heart Association: www.heart.org Academy of Nutrition and Dietetics: www.eatright.[place]: www.kidney.org Summary The DASH eating plan is a healthy eating plan that has been shown to reduce high blood pressure (hypertension). It may also reduce your risk for type 2 diabetes, heart disease, and stroke. When on the DASH eating plan, aim to eat more fresh fruits and vegetables, whole grains, lean proteins, low-fat dairy, and heart-healthy fats. With the DASH eating plan, you should limit salt (sodium) intake to 2,300 mg a day. If you have hypertension, you may need to reduce your sodium intake to 1,500 mg a day. Work with your health care provider or dietitian to adjust your eating plan to your individual calorie needs. This information is not intended to replace advice given to you by your health care provider. Make sure you discuss any questions you have with your health care provider. Document Revised: 05/23/2019 Document Reviewed: 05/23/2019 Elsevier Patient Education  [place]

## 2022-02-24 ENCOUNTER — Other Ambulatory Visit: Payer: PPO

## 2022-02-24 DIAGNOSIS — I10 Essential (primary) hypertension: Secondary | ICD-10-CM

## 2022-02-25 LAB — BASIC METABOLIC PANEL
BUN/Creatinine Ratio: 8 — ABNORMAL LOW (ref 12–28)
BUN: 12 mg/dL (ref 8–27)
CO2: 24 mmol/L (ref 20–29)
Calcium: 10 mg/dL (ref 8.7–10.3)
Chloride: 100 mmol/L (ref 96–106)
Creatinine, Ser: 1.5 mg/dL — ABNORMAL HIGH (ref 0.57–1.00)
Glucose: 109 mg/dL — ABNORMAL HIGH (ref 70–99)
Potassium: 4.2 mmol/L (ref 3.5–5.2)
Sodium: 139 mmol/L (ref 134–144)
eGFR: 37 mL/min/{1.73_m2} — ABNORMAL LOW (ref >59)

## 2022-03-01 ENCOUNTER — Telehealth: Payer: Self-pay | Admitting: Interventional Cardiology

## 2022-03-01 NOTE — Telephone Encounter (Signed)
Pt is returning call in regards to results. Transferred to [place], [name].

## 2022-03-01 NOTE — Telephone Encounter (Signed)
The patient has been notified of the result and verbalized understanding.  All questions (if any) were answered. Molli Barrows, RN 03/01/2022 4:17 PM

## 2022-03-22 ENCOUNTER — Ambulatory Visit: Payer: PPO | Attending: Cardiology | Admitting: Pharmacist

## 2022-03-22 VITALS — BP 166/78 | HR 64

## 2022-03-22 DIAGNOSIS — I10 Essential (primary) hypertension: Secondary | ICD-10-CM

## 2022-03-22 MED ORDER — LOSARTAN POTASSIUM 50 MG PO TABS: 50.0000 mg | ORAL_TABLET | Freq: Every day | ORAL | 3 refills | Status: DC

## 2022-03-22 MED ORDER — AMLODIPINE BESYLATE 5 MG PO TABS: 5.0000 mg | ORAL_TABLET | Freq: Every day | ORAL | 3 refills | Status: DC

## 2022-03-22 NOTE — Progress Notes (Signed)
Patient ID: Ashley Sloan                 DOB: 09-30-1950                      MRN: [id number]      HPI: Ashley Sloan is a 71 y.o. female referred by Dr. Tamala Julian to HTN clinic. PMH is significant for DM II, primary hypertension, hyperlipidemia, rheumatic mitral valve disease, pulmonary HTN and chest pain. Was seen by Dr. Tamala Julian 02/15/22. Her HCTZ had been d/c by PCP. BP was 178/95 at that visit. Spironolactone was increased to '25mg'$  daily and amlodipine 2.'5mg'$  daily was added. Repeat BMP was stable.  Patient presents today to clinic accompanied by her daughter. She denies dizziness, lightheadedness, headache, blurred vision, SOB or swelling. She does not remember why amlodipine was stopped previous. She has had swelling in the past, but doesn't remember it being from amlodipine. Has only checked her blood pressure at home a few times. Has a wrist cuff, but was resting it on her leg, not at heart level. Proper technique reviewed. She does eat out a lot since her husband died. Trying to be more aware of sodium. Does not exercise. 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 Has a bike at home.    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  Past Medical History:  Diagnosis Date   Arthritis    hands   Cancer ([place]) 1991   Breast-left   Diabetes mellitus    Hyperlipidemia    Hypertension    Left breast mass     Current Outpatient Medications on File Prior to Visit  Medication Sig Dispense Refill   acetaminophen (TYLENOL) 325 MG tablet Take 2 tablets (650 mg total) by mouth every 6 (six) hours as needed. 36 tablet 0   amLODipine (NORVASC) 2.5 MG tablet Take 1 tablet (2.5 mg total) by mouth daily. 90 tablet 3   atorvastatin (LIPITOR) 40 MG tablet Take 1 tablet (40 mg total) by mouth daily. 90 tablet 1   carvedilol (COREG) 25 MG tablet TAKE 1 TABLET(25 MG) BY MOUTH TWICE DAILY WITH A MEAL 180 tablet 3   clobetasol ointment (TEMOVATE) 0.05 % Apply twice daily to irritated areas over the groin.  30 g 0   diclofenac Sodium (VOLTAREN) 1 % GEL Apply 2 g topically 4 (four) times daily. 100 g 3   ibuprofen (ADVIL) 600 MG tablet Take 1 tablet (600 mg total) by mouth every 6 (six) hours as needed. 30 tablet 0   losartan (COZAAR) 25 MG tablet Take 1 tablet (25 mg total) by mouth daily. 90 tablet 3   metFORMIN (GLUCOPHAGE) 1000 MG tablet TAKE 1 TABLET(1000 MG) BY MOUTH TWICE DAILY WITH A MEAL 60 tablet 11   spironolactone (ALDACTONE) 25 MG tablet Take 1 tablet (25 mg total) by mouth daily. 90 tablet 3   No current facility-administered medications on file prior to visit.    No Known Allergies  There were no vitals taken for this visit.   Assessment/Plan:  1. Hypertension - Blood pressure is very elevated in clinic today above goal of <130/80. No home readings available. Will increase amlodipine to '5mg'$  daily and losartan  to '50mg'$  daily. BMP next Friday. I have encouraged patient to start exercising- encouraged her to add riding the bike into her daily routine. Start small and increase time as able. Follow up in clinic in 5 weeks which is my next available. Detailed instructions on how to check BP were provided and patient asked to start checking at home. Bring home cuff and readings to next visit.   Thank you  Ramond Dial, Pharm.D, BCPS, CPP [facility] A Division of [facility] [address], [place], [place] [address]  Phone: [phone]; Fax: [phone]

## 2022-03-22 NOTE — Patient Instructions (Addendum)
Please increase amlodipine to '5mg'$  daily. You may take 2 of the 2.'5mg'$  tablets until you run out. Then start taking 1 '5mg'$  tablet. Please increase losartan to '50mg'$  daily. You may take 2 of the '25mg'$  tablets until you run out. Then start taking 1 '50mg'$  tablet. Continue carvedilol '25mg'$  twice a day and spironolactone '25mg'$  daily Lab work on 9/29  Please bring your blood pressure cuff and readings to next appointment. Check blood pressure daily.  Your blood pressure goal is <130/80  To check your pressure at home you will need to:  1. Sit up in a chair, with feet flat on the floor and back supported. Do not cross your ankles or legs. 2. Rest your left arm so that the cuff is about heart level. If the cuff goes on your upper arm,  then just relax the arm on the table, arm of the chair or your lap. If you have a wrist cuff, we  suggest relaxing your wrist against your chest (think of it as Pledging the Flag with the  wrong arm).  3. Place the cuff snugly around your arm, about 1 inch above the crook of your elbow. The  cords should be inside the groove of your elbow.  4. Sit quietly, with the cuff in place, for about 5 minutes. After that 5 minutes press the power  button to start a reading. 5. Do not talk or move while the reading is taking place.  6. Record your readings on a sheet of paper. Although most cuffs have a memory, it is often  easier to see a pattern developing when the numbers are all in front of you.  7. You can repeat the reading after 1-3 minutes if it is recommended  Make sure your bladder is empty and you have not had caffeine or tobacco within the last 30 min  Always bring your blood pressure log with you to your appointments. If you have not brought your monitor in to be double checked for accuracy, please bring it to your next appointment.  You can find a list of validated (accurate) blood pressure cuffs at validatebp.org

## 2022-03-31 ENCOUNTER — Ambulatory Visit: Payer: PPO | Attending: Cardiology

## 2022-03-31 DIAGNOSIS — I10 Essential (primary) hypertension: Secondary | ICD-10-CM

## 2022-03-31 LAB — BASIC METABOLIC PANEL
BUN/Creatinine Ratio: 14 (ref 12–28)
BUN: 17 mg/dL (ref 8–27)
CO2: 24 mmol/L (ref 20–29)
Calcium: 10.1 mg/dL (ref 8.7–10.3)
Chloride: 102 mmol/L (ref 96–106)
Creatinine, Ser: 1.2 mg/dL — ABNORMAL HIGH (ref 0.57–1.00)
Glucose: 112 mg/dL — ABNORMAL HIGH (ref 70–99)
Potassium: 4.7 mmol/L (ref 3.5–5.2)
Sodium: 140 mmol/L (ref 134–144)
eGFR: 48 mL/min/{1.73_m2} — ABNORMAL LOW (ref >59)

## 2022-04-22 ENCOUNTER — Other Ambulatory Visit: Payer: Self-pay | Admitting: Family Medicine

## 2022-04-22 DIAGNOSIS — E11618 Type 2 diabetes mellitus with other diabetic arthropathy: Secondary | ICD-10-CM

## 2022-04-28 ENCOUNTER — Ambulatory Visit: Payer: PPO | Attending: Cardiovascular Disease | Admitting: Pharmacist

## 2022-04-28 DIAGNOSIS — I10 Essential (primary) hypertension: Secondary | ICD-10-CM

## 2022-04-28 MED ORDER — LOSARTAN POTASSIUM 100 MG PO TABS: 100.0000 mg | ORAL_TABLET | Freq: Every day | ORAL | 3 refills | Status: DC

## 2022-04-28 NOTE — Assessment & Plan Note (Signed)
Assessment:  Blood pressure still above goal of <130/80  Home cuff not accurate  Has not started exercising yet  Patient not thrilled about changes to medications  Was taking amlodipine 2.'5mg'$  BID and losartan '25mg'$  BID, occassionally missing the evening dose  Plan:  Increase losartan to '100mg'$  daily. Patient educated to take all 4 ('25mg'$  tablets) in the AM. Rx for losartan '100mg'$  has been sent in.  Continue amlodipine '5mg'$  (all in the AM), carvedilol '25mg'$  twice a daily and spironolactone '25mg'$  daily  BMP in 2 weeks  I have encouraged her to start riding the bike. Reminded her that exercise and replace 1 BP medication if she does consistently.  She has follow up with Dr. Tamala Julian in Nov and follow up with me in Dec

## 2022-04-28 NOTE — Progress Notes (Signed)
Patient ID: ISEBELLA UPSHUR                 DOB: [birth year]                      MRN: [id number]      HPI: Ashley Sloan is a 71 y.o. female referred by Dr. Tamala Julian to HTN clinic. PMH is significant for DM II, primary hypertension, hyperlipidemia, rheumatic mitral valve disease, pulmonary HTN and chest pain. Was seen by Dr. Tamala Julian 02/15/22. Her HCTZ had been d/c by PCP. BP was 178/95 at that visit. Spironolactone was increased to '25mg'$  daily and amlodipine 2.'5mg'$  daily was added. Repeat BMP was stable.  At last visit with PharmD, amlodipine was increased to '5mg'$  and losartan to '50mg'$  due to BP of 166/78. Follow up BMP was stable. Patient was encouraged her to add riding the bike into her daily routine and proper technique for checking blood pressure was reviewed.   Patient presents today to clinic accompanied by her daughter. She denies dizziness, lightheadedness, headache, blurred vision, SOB or swelling. She does not remember why amlodipine was stopped previous. She has had swelling in the past, but doesn't remember it being from amlodipine. . 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 Has a bike at home but has not started using it yet. She brings in her home wrist cuff, found to be inaccurate. Home readings varied, some at goal and some above goal, however when adjusted with difference if cuff, very few at goal   Office: 152/78 Office: 150/72 Home: 141/69 Office: 152/70 Home: 140/75   Current HTN meds: amlodipine '5mg'$  daily, carvedilol '25mg'$  twice a daily, losartan '50mg'$  daily, spironolactone '25mg'$  daily Previously tried: lisinopril/HCTZ (gout), irbesartan, amlodipine 5, '10mg'$ , verapamil BP goal: <130/80  Family History:  Family History  Problem Relation Age of Onset   Diabetes Mother    Hypertension Mother    COPD Mother    Hypertension Father    Heart disease Sister    Hypertension Brother    Diabetes Daughter     Social History: no tobacco, no  alcohol  Diet: coffee 1 cup, soda 1 per day Trying to decrease salt Eats out a lot  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, so moving all day  Home BP readings: 166/74, 123/51, 153/71, 150/61, 146/64, 138/83, 134/62, 132/79, 120/55, 132/59, 147/65, 121/52, 137/71, 164/80, 124/61, 148/68, 135/59, 134/65, 132/69, 133/63, 134/63, 164/74, 170/64, 146/72, 113/61, 132/79, 122/62  Wt Readings from Last 3 Encounters:  02/15/22 156 lb (70.8 kg)  01/20/22 157 lb 12.8 oz (71.6 kg)  11/30/21 164 lb 9.6 oz (74.7 kg)   BP Readings from Last 3 Encounters:  03/22/22 (!) 166/78  02/15/22 (!) 178/95  01/20/22 (!) 188/94   Pulse Readings from Last 3 Encounters:  03/22/22 64  02/15/22 62  01/20/22 62    Renal function: CrCl cannot be calculated (Patient's most recent lab result is older than the maximum 21 days allowed.).  Past Medical History:  Diagnosis Date   Arthritis    hands   Cancer ([place]) 1991   Breast-left   Diabetes mellitus    Hyperlipidemia    Hypertension    Left breast mass     Current Outpatient Medications on File Prior to Visit  Medication Sig Dispense Refill   acetaminophen (TYLENOL) 325 MG tablet Take 2 tablets (650 mg total) by mouth every 6 (six) hours as needed. 36 tablet  0   amLODipine (NORVASC) 5 MG tablet Take 1 tablet (5 mg total) by mouth daily. 90 tablet 3   atorvastatin (LIPITOR) 40 MG tablet Take 1 tablet (40 mg total) by mouth daily. 90 tablet 1   carvedilol (COREG) 25 MG tablet TAKE 1 TABLET(25 MG) BY MOUTH TWICE DAILY WITH A MEAL 180 tablet 3   clobetasol ointment (TEMOVATE) 0.05 % Apply twice daily to irritated areas over the groin. 30 g 0   diclofenac Sodium (VOLTAREN) 1 % GEL Apply 2 g topically 4 (four) times daily. 100 g 3   ibuprofen (ADVIL) 600 MG tablet Take 1 tablet (600 mg total) by mouth every 6 (six) hours as needed. 30 tablet 0   losartan (COZAAR) 50 MG tablet Take 1 tablet (50 mg total) by mouth daily. 90 tablet 3   metFORMIN (GLUCOPHAGE) 1000 MG  tablet TAKE 1 TABLET(1000 MG) BY MOUTH TWICE DAILY WITH A MEAL 60 tablet 11   spironolactone (ALDACTONE) 25 MG tablet Take 1 tablet (25 mg total) by mouth daily. 90 tablet 3   No current facility-administered medications on file prior to visit.    No Known Allergies  There were no vitals taken for this visit.   Assessment/Plan:  1. Hypertension - Assessment: Blood pressure still above goal of <130/80 Home cuff not accurate Has not started exercising yet Patient not thrilled about changes to medications Was taking amlodipine 2.'5mg'$  BID and losartan '25mg'$  BID, occassionally missing the evening dose  Plan: Increase losartan to '100mg'$  daily. Patient educated to take all 4 ('25mg'$  tablets) in the AM. Rx for losartan '100mg'$  has been sent in. Continue amlodipine '5mg'$  (all in the AM), carvedilol '25mg'$  twice a daily and spironolactone '25mg'$  daily BMP in 2 weeks I have encouraged her to start riding the bike. Reminded her that exercise and replace 1 BP medication if she does consistently. She has follow up with Dr. Tamala Julian in Nov and follow up with me in Dec  Thank you  Ramond Dial, Pharm.D, BCPS, CPP [facility] Division of [facility] [place] [address] [place], [place] [address]  Phone: [phone]; Fax: [phone]

## 2022-04-28 NOTE — Patient Instructions (Signed)
Summary of today's discussion  1.Please pick up a new upper arm blood pressure cuff  2.Please increase losartan to '100mg'$  daily  3. Continue amlodipine '5mg'$  daily, carvedilol '25mg'$  twice a daily and spironolactone '25mg'$  daily  4. Start riding the bike for 10 min each day  5.Continue checking blood pressure daily   Your blood pressure goal is <130/80  To check your pressure at home you will need to:  1. Sit up in a chair, with feet flat on the floor and back supported. Do not cross your ankles or legs. 2. Rest your left arm so that the cuff is about heart level. If the cuff goes on your upper arm,  then just relax the arm on the table, arm of the chair or your lap. If you have a wrist cuff, we  suggest relaxing your wrist against your chest (think of it as Pledging the Flag with the  wrong arm).  3. Place the cuff snugly around your arm, about 1 inch above the crook of your elbow. The  cords should be inside the groove of your elbow.  4. Sit quietly, with the cuff in place, for about 5 minutes. After that 5 minutes press the power  button to start a reading. 5. Do not talk or move while the reading is taking place.  6. Record your readings on a sheet of paper. Although most cuffs have a memory, it is often  easier to see a pattern developing when the numbers are all in front of you.  7. You can repeat the reading after 1-3 minutes if it is recommended  Make sure your bladder is empty and you have not had caffeine or tobacco within the last 30 min  Always bring your blood pressure log with you to your appointments. If you have not brought your monitor in to be double checked for accuracy, please bring it to your next appointment.  You can find a list of validated (accurate) blood pressure cuffs at [employer]   Important lifestyle changes to control high blood pressure  Intervention  Effect on the BP  Lose extra pounds and watch your waistline Weight loss is one of the most  effective lifestyle changes for controlling blood pressure. If you're overweight or obese, losing even a small amount of weight can help reduce blood pressure. Blood pressure might go down by about 1 millimeter of mercury (mm Hg) with each kilogram (about 2.2 pounds) of weight lost.  Exercise regularly As a general goal, aim for at least 30 minutes of moderate physical activity every day. Regular physical activity can lower high blood pressure by about 5 to 8 mm Hg.  Eat a healthy diet Eating a diet rich in whole grains, fruits, vegetables, and low-fat dairy products and low in saturated fat and cholesterol. A healthy diet can lower high blood pressure by up to 11 mm Hg.  Reduce salt (sodium) in your diet Even a small reduction of sodium in the diet can improve heart health and reduce high blood pressure by about 5 to 6 mm Hg.  Limit alcohol One drink equals 12 ounces of beer, 5 ounces of wine, or 1.5 ounces of 80-proof liquor.  Limiting alcohol to less than one drink a day for women or two drinks a day for men can help lower blood pressure by about 4 mm Hg.   Please call me at [phone] with any questions.

## 2022-05-12 ENCOUNTER — Ambulatory Visit: Payer: PPO | Attending: Interventional Cardiology

## 2022-05-12 DIAGNOSIS — I10 Essential (primary) hypertension: Secondary | ICD-10-CM

## 2022-05-12 LAB — BASIC METABOLIC PANEL
BUN/Creatinine Ratio: 13 (ref 12–28)
BUN: 18 mg/dL (ref 8–27)
CO2: 23 mmol/L (ref 20–29)
Calcium: 10.3 mg/dL (ref 8.7–10.3)
Chloride: 105 mmol/L (ref 96–106)
Creatinine, Ser: 1.34 mg/dL — ABNORMAL HIGH (ref 0.57–1.00)
Glucose: 104 mg/dL — ABNORMAL HIGH (ref 70–99)
Potassium: 4.4 mmol/L (ref 3.5–5.2)
Sodium: 141 mmol/L (ref 134–144)
eGFR: 42 mL/min/{1.73_m2} — ABNORMAL LOW (ref >59)

## 2022-05-15 ENCOUNTER — Telehealth: Payer: Self-pay | Admitting: Pharmacist

## 2022-05-15 MED ORDER — BLOOD PRESSURE MONITOR MISC
1.0000 | Freq: Every day | 0 refills | Status: AC
Start: 2022-05-15 — End: ?

## 2022-05-15 NOTE — Telephone Encounter (Signed)
Spoke with patient. Advised labs are stable, continue current medications. Encouraged exercise. She asked about insurance paying for BP cuff. I advised she call medicare to find out the process but I could also send Rx to pharmacy.

## 2022-05-21 NOTE — Progress Notes (Unsigned)
Cardiology Office Note:    Date:  05/23/2022   ID:  Ashley Sloan, DOB 08-Nov-1950, MRN [id number]  PCP:  Donney Dice, DO  Cardiologist:  Sinclair Grooms, MD   Referring MD: Donney Dice, DO   Chief Complaint  Patient presents with   Cardiac Valve Problem    Significant mitral regurgitation    History of Present Illness:    Ashley Sloan is a 71 y.o. female with a hx of DM II, primary hypertension, hyperlipidemia, chest pain, and severe MR.   Back early to specifically consider MR noted as severe on August 2023 study.  I saw her earlier this year because of elevated blood pressure and chest pain.  An echocardiogram was performed as part of the work-up.  The echo was ordered because of the appearance of left anterior hemiblock and LVH on EKG when seen in July.  Surprisingly the echocardiogram which was done in August demonstrated severe abnormalities of the mitral valve.  The mitral valve had no specific auscultatory abnormalities on either of 2 exams.  I had her return today after reviewing the echo which definitely shows central mitral regurgitation of significance, to discuss the findings and to have another opportunity to perform clinical exam.  Again in the interval since last being seen in August she has not had any heart failure symptoms.  She continues to work as a Secretary/administrator.  The echocardiogram was performed February 06, 2022.  Past Medical History:  Diagnosis Date   Arthritis    hands   Cancer ([place]) 1991   Breast-left   Diabetes mellitus    Hyperlipidemia    Hypertension    Left breast mass     Past Surgical History:  Procedure Laterality Date   BREAST EXCISIONAL BIOPSY Left    benign   BREAST LUMPECTOMY Left 1991   BREAST SURGERY  1991   Lumpectomy   CESAREAN SECTION  1968   CYSTOSCOPY WITH URETEROSCOPY AND STENT PLACEMENT Right 10/11/2020   Procedure: CYSTOSCOPY WITH URETEROSCOPY AND STENT PLACEMENT/ RETROGRADE PYELOGRAM/ BIOPSY;  Surgeon: Raynelle Bring,  MD;  Location: [facility];  Service: Urology;  Laterality: Right;   MASS EXCISION Left 11/19/2017   Procedure: EXCISION LEFT BREAST  MASS ERAS PATHWAY;  Surgeon: Excell Seltzer, MD;  Location: [facility];  Service: General;  Laterality: Left;   ROBOT ASSITED LAPAROSCOPIC NEPHROURETERECTOMY Right 12/06/2020   Procedure: XI ROBOT ASSITED LAPAROSCOPIC NEPHROURETERECTOMY WITH POST OPERTIVE INSTILLATION OF INTRAVESICAL GEMCITABINE;  Surgeon: Raynelle Bring, MD;  Location: [facility];  Service: Urology;  Laterality: Right;    Current Medications: Current Meds  Medication Sig   acetaminophen (TYLENOL) 325 MG tablet Take 2 tablets (650 mg total) by mouth every 6 (six) hours as needed.   amLODipine (NORVASC) 5 MG tablet Take 1 tablet (5 mg total) by mouth daily.   atorvastatin (LIPITOR) 40 MG tablet Take 1 tablet (40 mg total) by mouth daily.   Blood Pressure Monitor MISC 1 each by Does not apply route daily.   carvedilol (COREG) 25 MG tablet TAKE 1 TABLET(25 MG) BY MOUTH TWICE DAILY WITH A MEAL   clobetasol ointment (TEMOVATE) 0.05 % Apply twice daily to irritated areas over the groin.   diclofenac Sodium (VOLTAREN) 1 % GEL Apply 2 g topically 4 (four) times daily.   ibuprofen (ADVIL) 600 MG tablet Take 1 tablet (600 mg total) by mouth every 6 (six) hours as needed.   losartan (COZAAR) 100 MG tablet Take 1 tablet (100 mg  total) by mouth daily.   metFORMIN (GLUCOPHAGE) 1000 MG tablet TAKE 1 TABLET(1000 MG) BY MOUTH TWICE DAILY WITH A MEAL   spironolactone (ALDACTONE) 25 MG tablet Take 1 tablet (25 mg total) by mouth daily.     Allergies:   Patient has no known allergies.   Social History   Socioeconomic History   Marital status: Widowed    Spouse name: Not on file   Number of children: 1   Years of education: 12   Highest education level: High school graduate  Occupational History   Not on file  Tobacco Use   Smoking status: Former    Packs/day: 0.50    Years: 25.00    Total  pack years: 12.50    Types: Cigarettes    Start date: 07/03/1964    Quit date: 12/18/1982    Years since quitting: 39.4   Smokeless tobacco: Never  Vaping Use   Vaping Use: Never used  Substance and Sexual Activity   Alcohol use: No    Alcohol/week: 0.0 standard drinks of alcohol   Drug use: No   Sexual activity: Yes    Birth control/protection: Post-menopausal  Other Topics Concern   Not on file  Social History Narrative   Patient lives alone in [place].    Patient has one daughter, she is very close with her.    Patient is a widow ~3 years.   Patient is still working as a Chartered certified accountant.    Patient enjoys reading and watching TV.   Social Determinants of Health   Financial Resource Strain: Low Risk  (01/21/2020)   Overall Financial Resource Strain (CARDIA)    Difficulty of Paying Living Expenses: Not very hard  Food Insecurity: No Food Insecurity (01/21/2020)   Hunger Vital Sign    Worried About Running Out of Food in the Last Year: Never true    Ran Out of Food in the Last Year: Never true  Transportation Needs: No Transportation Needs (01/21/2020)   PRAPARE - Hydrologist (Medical): No    Lack of Transportation (Non-Medical): No  Physical Activity: Inactive (01/21/2020)   Exercise Vital Sign    Days of Exercise per Week: 0 days    Minutes of Exercise per Session: 0 min  Stress: No Stress Concern Present (01/21/2020)   [place]    Feeling of Stress : Only a little  Social Connections: Moderately Integrated (01/21/2020)   Social Connection and Isolation Panel [NHANES]    Frequency of Communication with Friends and Family: More than three times a week    Frequency of Social Gatherings with Friends and Family: More than three times a week    Attends Religious Services: More than 4 times per year    Active Member of [employer] or Organizations: Yes    Attends Archivist  Meetings: More than 4 times per year    Marital Status: Widowed     Family History: The patient's family history includes COPD in her mother; Diabetes in her daughter and mother; Heart disease in her sister; Hypertension in her brother, father, and mother.  ROS:   Please see the history of present illness.    Daughter accompanies her today.  No interval symptoms.  She feels well.  She has not taken her antihypertensive medication today.  All other systems reviewed and are negative.  EKGs/Labs/Other Studies Reviewed:    The following studies were reviewed today:  2 D  Doppler Echocardiogram 02/06/2022: IMPRESSIONS   1. There is severe mitral valve regurgitation. The leaflets and thickened  and calcified. There is moderate to severe annular calcium. This leaflets  are restricted in systole/diastole (IIIA pathology). This could be related  to rheumatic valve disease or  degenerative calcific disease. TEE is recommended for better  characterization. The mitral valve is degenerative. Severe mitral valve  regurgitation. Mild mitral stenosis. The mean mitral valve gradient is 7.0  mmHg with average heart rate of 73 bpm.  Moderate to severe mitral annular calcification.   2. Left ventricular ejection fraction, by estimation, is 60 to 65%. The  left ventricle has normal function. The left ventricle has no regional  wall motion abnormalities. Left ventricular diastolic function could not  be evaluated.   3. Right ventricular systolic function is normal. The right ventricular  size is normal. There is severely elevated pulmonary artery systolic  pressure. The estimated right ventricular systolic pressure is 94.7 mmHg.   4. Left atrial size was mild to moderately dilated.   5. Right atrial size was mildly dilated.   6. Tricuspid valve regurgitation is mild to moderate.   7. The aortic valve is tricuspid. Aortic valve regurgitation is not  visualized. No aortic stenosis is present.   8. The  inferior vena cava is normal in size with <50% respiratory  variability, suggesting right atrial pressure of 8 mmHg.   EKG:  EKG performed January 02, 2022, demonstrated left anterior hemiblock, incomplete right bundle branch block, and voltage criteria suggestive of LVH.  Recent Labs: 01/02/2022: ALT 20; B Natriuretic Peptide 191.6; Hemoglobin 13.6; Platelets 282 05/12/2022: BUN 18; Creatinine, Ser 1.34; Potassium 4.4; Sodium 141  Recent Lipid Panel    Component Value Date/Time   CHOL 117 02/02/2020 1617   TRIG 64 02/02/2020 1617   HDL 54 02/02/2020 1617   CHOLHDL 2.2 02/02/2020 1617   CHOLHDL 2.5 03/17/2016 1405   VLDL 23 03/17/2016 1405   LDLCALC 49 02/02/2020 1617   LDLDIRECT 106 03/10/2015 1557    Physical Exam:    VS:  BP (!) 154/98   Pulse 72   Ht 5' (1.524 m)   Wt 158 lb 3.2 oz (71.8 kg)   SpO2 100%   BMI 30.90 kg/m     Wt Readings from Last 3 Encounters:  05/23/22 158 lb 3.2 oz (71.8 kg)  02/15/22 156 lb (70.8 kg)  01/20/22 157 lb 12.8 oz (71.6 kg)     GEN: Overweight. No acute distress HEENT: Normal NECK: No JVD. LYMPHATICS: No lymphadenopathy CARDIAC: No murmur. RRR no gallop, or edema. VASCULAR:  Normal Pulses. No bruits. RESPIRATORY:  Clear to auscultation without rales, wheezing or rhonchi  ABDOMEN: Soft, non-tender, non-distended, No pulsatile mass, MUSCULOSKELETAL: No deformity  SKIN: Warm and dry NEUROLOGIC:  Alert and oriented x 3 PSYCHIATRIC:  Normal affect   ASSESSMENT:    1. Rheumatic mitral valve disease   2. HTN (hypertension), benign   3. Pulmonary hypertension, unspecified ([place])   4. Other hyperlipidemia   5. Left anterior hemiblock   6. Type 2 diabetes mellitus with other circulatory complication, unspecified whether long term insulin use (HCC)    PLAN:    In order of problems listed above:  Supposedly severe mitral regurgitation on echo.  I personally reviewed the images and there is a significant amount of regurgitation.  There is a  paradox however, on exam I am not hearing a systolic murmur.  When the echo was done, there was significant elevation  in blood pressure which is now being better controlled. He has not had her medication yet today.  Hence the elevated blood pressure which upon repeat was 155/90 mmHg.  She has not had any of her for therapies.  If she continues to have issues with blood pressure elevation preventing [place] from achieving the target of 130/80 mmHg, hydrochlorothiazide should be added. Significant pulmonary hypertension by echo.  She does not snore.  No history of PE.  The elevated pulmonary pressures suggest that the mitral regurgitation is a problem.  We will plan to repeat the 2D Doppler echocardiogram in about 4-5 months before her follow-up visit with Dr. Johney Frame.  Hopefully MR and pulmonary pressures would be better at that time.  If not, she should have transesophageal echocardiography performed. Continue Lipitor 40 mg/day EKG was not repeated today. Consider adding SGLT2 therapy.   Rheumatic valvular disease without symptoms.  Significant mitral regurgitation.  The index echo was done when blood pressure was extremely high.  We will repeat echo prior to her next visit.  Echo will be done under better blood pressure control.   Medication Adjustments/Labs and Tests Ordered: Current medicines are reviewed at length with the patient today.  Concerns regarding medicines are outlined above.  Orders Placed This Encounter  Procedures   ECHOCARDIOGRAM COMPLETE   No orders of the defined types were placed in this encounter.   Patient Instructions  Medication Instructions:  Your physician recommends that you continue on your current medications as directed. Please refer to the Current Medication list given to you today.  *If you need a refill on your cardiac medications before your next appointment, please call your pharmacy*  Testing/Procedures: Your physician has requested that you have an  echocardiogram April 2024 (prior to appointment with Dr. Johney Frame). Echocardiography is a painless test that uses sound waves to create images of your heart. It provides your doctor with information about the size and shape of your heart and how well your heart's chambers and valves are working. This procedure takes approximately one hour. There are no restrictions for this procedure. Please do NOT wear cologne, perfume, aftershave, or lotions (deodorant is allowed). Please arrive 15 minutes prior to your appointment time.  Follow-Up: At [facility], you and your health needs are our priority.  As part of our continuing mission to provide you with exceptional heart care, we have created designated Provider Care Teams.  These Care Teams include your primary Cardiologist (physician) and Advanced Practice Providers (APPs -  Physician Assistants and Nurse Practitioners) who all work together to provide you with the care you need, when you need it.  Your next appointment:   6 month(s)  The format for your next appointment:   In Person  Provider:   Freada Bergeron, MD  Important Information About Sugar         Signed, Sinclair Grooms, MD  05/23/2022 1:19 PM    [place]

## 2022-05-23 ENCOUNTER — Ambulatory Visit: Payer: PPO | Attending: Interventional Cardiology | Admitting: Interventional Cardiology

## 2022-05-23 ENCOUNTER — Encounter: Payer: Self-pay | Admitting: Interventional Cardiology

## 2022-05-23 VITALS — BP 150/90 | HR 72 | Ht 60.0 in | Wt 158.2 lb

## 2022-05-23 DIAGNOSIS — E1159 Type 2 diabetes mellitus with other circulatory complications: Secondary | ICD-10-CM

## 2022-05-23 DIAGNOSIS — I10 Essential (primary) hypertension: Secondary | ICD-10-CM | POA: Diagnosis not present

## 2022-05-23 DIAGNOSIS — E7849 Other hyperlipidemia: Secondary | ICD-10-CM

## 2022-05-23 DIAGNOSIS — I272 Pulmonary hypertension, unspecified: Secondary | ICD-10-CM

## 2022-05-23 DIAGNOSIS — I059 Rheumatic mitral valve disease, unspecified: Secondary | ICD-10-CM | POA: Diagnosis not present

## 2022-05-23 DIAGNOSIS — I444 Left anterior fascicular block: Secondary | ICD-10-CM

## 2022-05-23 NOTE — Patient Instructions (Addendum)
Medication Instructions:  Your physician recommends that you continue on your current medications as directed. Please refer to the Current Medication list given to you today.  *If you need a refill on your cardiac medications before your next appointment, please call your pharmacy*  Testing/Procedures: Your physician has requested that you have an echocardiogram April 2024 (prior to appointment with Dr. Johney Frame). Echocardiography is a painless test that uses sound waves to create images of your heart. It provides your doctor with information about the size and shape of your heart and how well your heart's chambers and valves are working. This procedure takes approximately one hour. There are no restrictions for this procedure. Please do NOT wear cologne, perfume, aftershave, or lotions (deodorant is allowed). Please arrive 15 minutes prior to your appointment time.  Follow-Up: At [facility], you and your health needs are our priority.  As part of our continuing mission to provide you with exceptional heart care, we have created designated Provider Care Teams.  These Care Teams include your primary Cardiologist (physician) and Advanced Practice Providers (APPs -  Physician Assistants and Nurse Practitioners) who all work together to provide you with the care you need, when you need it.  Your next appointment:   6 month(s)  The format for your next appointment:   In Person  Provider:   Freada Bergeron, MD  Important Information About Sugar

## 2022-06-22 ENCOUNTER — Ambulatory Visit: Payer: PPO

## 2022-07-06 ENCOUNTER — Other Ambulatory Visit: Payer: Self-pay

## 2022-07-06 DIAGNOSIS — I10 Essential (primary) hypertension: Secondary | ICD-10-CM

## 2022-07-06 MED ORDER — CARVEDILOL 25 MG PO TABS: ORAL_TABLET | ORAL | 3 refills | Status: DC

## 2022-07-11 ENCOUNTER — Ambulatory Visit (INDEPENDENT_AMBULATORY_CARE_PROVIDER_SITE_OTHER): Payer: PPO | Admitting: Student

## 2022-07-11 VITALS — BP 136/80 | HR 104 | Ht 60.0 in | Wt 160.4 lb

## 2022-07-11 DIAGNOSIS — I10 Essential (primary) hypertension: Secondary | ICD-10-CM

## 2022-07-11 DIAGNOSIS — E785 Hyperlipidemia, unspecified: Secondary | ICD-10-CM

## 2022-07-11 DIAGNOSIS — M791 Myalgia, unspecified site: Secondary | ICD-10-CM

## 2022-07-11 DIAGNOSIS — R252 Cramp and spasm: Secondary | ICD-10-CM

## 2022-07-11 LAB — POCT INFLUENZA A/B
Influenza A, POC: NEGATIVE
Influenza B, POC: NEGATIVE

## 2022-07-11 MED ORDER — ATORVASTATIN CALCIUM 40 MG PO TABS: 40.0000 mg | ORAL_TABLET | Freq: Every day | ORAL | 1 refills | Status: DC

## 2022-07-11 MED ORDER — CARVEDILOL 25 MG PO TABS: ORAL_TABLET | ORAL | 3 refills | Status: DC

## 2022-07-11 NOTE — Patient Instructions (Addendum)
It was great to see you today! Thank you for choosing [facility] for your primary care. Ashley Sloan was seen for follow up.  Today we addressed: Continue with regular tylenol for pain  We will check you for flu  I have also ordered some labs to check for electrolyte problems   If you haven't already, sign up for My Chart to have easy access to your labs results, and communication with your primary care physician.  I recommend that you always bring your medications to each appointment as this makes it easy to ensure you are on the correct medications and helps [place] not miss refills when you need them. Call the clinic at [phone] if your symptoms worsen or you have any concerns.  You should return to our clinic Return if symptoms worsen or fail to improve. Please arrive 15 minutes before your appointment to ensure smooth check in process.  We appreciate your efforts in making this happen.  Thank you for allowing me to participate in your care, Erskine Emery, MD 07/11/2022, 3:26 PM PGY-2, [place]

## 2022-07-11 NOTE — Progress Notes (Signed)
SUBJECTIVE:   CHIEF COMPLAINT / HPI:   Back Pain  Neck pain  Shoulder pain: Pain throughout the back and neck with radiation to the right leg laterally. 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 Aching pain all over. Nasal congestion but no cough, no fever or chills. Pain radiates down the right leg laterally, normal gait without numbness or tingling. She reports no bowel or bladder incontinence. No saddle paraesthesias.  No radiation of pain down the arms.  Hand Cramps: Cramps in the fingers of both hands started one week ago. No changes to medications or foods.  Patient reports that the cramping has remained the same and can be irritating.  She is due for an echo in April 2024 for MVR, sees cardiology.   PERTINENT  PMH / PSH: Arthritis, HLD, HTN, h/o breast cancer   Past Medical History:  Diagnosis Date   Arthritis    hands   Cancer ([place]) 1991   Breast-left   Diabetes mellitus    Hyperlipidemia    Hypertension    Left breast mass     OBJECTIVE:  BP 136/80   Pulse (!) 104   Ht 5' (1.524 m)   Wt 160 lb 6.4 oz (72.8 kg)   SpO2 100%   BMI 31.33 kg/m  Physical Exam Vitals reviewed.  Constitutional:      Appearance: Normal appearance.  HENT:     Head: Normocephalic.     Nose: Nose normal.     Mouth/Throat:     Mouth: Mucous membranes are moist.  Eyes:     Conjunctiva/sclera: Conjunctivae normal.  Cardiovascular:     Rate and Rhythm: Normal rate and regular rhythm.  Pulmonary:     Effort: Pulmonary effort is normal.     Breath sounds: Normal breath sounds.  Musculoskeletal:        General: No swelling or tenderness. Normal range of motion.     Cervical back: Normal range of motion.     Comments: Back exam unremarkable with forward flexion and extension, gait appropriate/slight hesitation with gait, strength of upper and lower extremities intact, normal sensation, Nontender over spinous processes  Full range of motion with normal strength of the  shoulders bilaterally.  Tenderness to palpation over the cervical paraspinal muscles  Skin:    Capillary Refill: Capillary refill takes less than 2 seconds.  Neurological:     General: No focal deficit present.     Mental Status: She is alert.  Psychiatric:        Mood and Affect: Mood normal.        Behavior: Behavior normal.      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  Also, possibly related to arthritis.  Patient has no significant red flag symptoms and is hemodynamically stable in office.  No systemic changes at present.  Continue with Tylenol in addition to home exercises that were provided in office.  Orders: -     POCT Influenza A/B  HTN (hypertension), benign -     Carvedilol; TAKE 1 TABLET(25 MG) BY MOUTH TWICE DAILY WITH A MEAL  Dispense: 180 tablet; Refill: 3  Hyperlipidemia, unspecified hyperlipidemia type -     Atorvastatin Calcium; Take 1 tablet (40 mg total) by mouth daily.  Dispense: 90 tablet; Refill: 1  Cramping of hands Assessment & Plan:  Differential includes arthritis, but we will rule out electrolyte abnormality with BMP and mag.  Continue with hydration and stretching with fingers as often as possible.  Orders: -     Basic metabolic panel -     Magnesium  Elevated heart rate as patient has been off of her Coreg, sent a refill for carvedilol.  Return if symptoms worsen or fail to improve. Erskine Emery, MD 07/11/2022, 4:05 PM PGY-2, [place]

## 2022-07-11 NOTE — Assessment & Plan Note (Signed)
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  Also, possibly related to arthritis.  Patient has no significant red flag symptoms and is hemodynamically stable in office.  No systemic changes at present.  Continue with Tylenol in addition to home exercises that were provided in office.

## 2022-07-11 NOTE — Assessment & Plan Note (Signed)
Differential includes arthritis, but we will rule out electrolyte abnormality with BMP and mag.  Continue with hydration and stretching with fingers as often as possible.

## 2022-07-12 LAB — BASIC METABOLIC PANEL
BUN/Creatinine Ratio: 10 — ABNORMAL LOW (ref 12–28)
BUN: 18 mg/dL (ref 8–27)
CO2: 21 mmol/L (ref 20–29)
Calcium: 10.3 mg/dL (ref 8.7–10.3)
Chloride: 102 mmol/L (ref 96–106)
Creatinine, Ser: 1.74 mg/dL — ABNORMAL HIGH (ref 0.57–1.00)
Glucose: 91 mg/dL (ref 70–99)
Potassium: 4.7 mmol/L (ref 3.5–5.2)
Sodium: 138 mmol/L (ref 134–144)
eGFR: 31 mL/min/{1.73_m2} — ABNORMAL LOW (ref >59)

## 2022-07-12 LAB — MAGNESIUM: Magnesium: 1.7 mg/dL (ref 1.6–2.3)

## 2022-07-24 ENCOUNTER — Encounter: Payer: Self-pay | Admitting: Family Medicine

## 2022-07-24 ENCOUNTER — Ambulatory Visit (INDEPENDENT_AMBULATORY_CARE_PROVIDER_SITE_OTHER): Payer: PPO | Admitting: Family Medicine

## 2022-07-24 VITALS — BP 132/61 | HR 61 | Ht 60.0 in | Wt 162.6 lb

## 2022-07-24 DIAGNOSIS — E11618 Type 2 diabetes mellitus with other diabetic arthropathy: Secondary | ICD-10-CM

## 2022-07-24 DIAGNOSIS — I10 Essential (primary) hypertension: Secondary | ICD-10-CM

## 2022-07-24 DIAGNOSIS — R7989 Other specified abnormal findings of blood chemistry: Secondary | ICD-10-CM

## 2022-07-24 LAB — POCT GLYCOSYLATED HEMOGLOBIN (HGB A1C): HbA1c, POC (controlled diabetic range): 6.4 % (ref 0.0–7.0)

## 2022-07-24 NOTE — Assessment & Plan Note (Signed)
BP remains elevated in the office. BP being managed currently by cardiology office. Creatinine was elevated on last BMP, will re-check today. - BMP today - urine microalbumin - Patient to keep BP log and call cardiology office if elevated

## 2022-07-24 NOTE — Progress Notes (Signed)
    SUBJECTIVE:   CHIEF COMPLAINT / HPI:   T2DM - Checking BG at home: no - Medications: Metformin '1000mg'$  BID, atorvastatin '40mg'$  daily - Compliance: good - eye exam: reports last exam in 2021 - foot exam: being done today - microalbumin:  completing today - denies symptoms of hypoglycemia, polyuria, polydipsia, numbness extremities, foot ulcers/trauma  HTN: - Medications: amlodipine '5mg'$  daily, coreg '25mg'$  BID, losartan '100mg'$  daily, spironolactone  - Compliance: good - Checking BP at home: has fluctuations, average of around 150s  - Denies any SOB, CP, vision changes, LE edema, medication SEs, or symptoms of hypotension   PERTINENT  PMH / PSH:   OBJECTIVE:   BP 132/61   Pulse 61   Ht 5' (1.524 m)   Wt 162 lb 9.6 oz (73.8 kg)   SpO2 100%   BMI 31.76 kg/m    General: NAD, well-appearing, well-nourished Respiratory: No respiratory distress, breathing comfortably, able to speak in full sentences Skin: warm and dry, no rashes noted on exposed skin Psych: Appropriate affect and mood  Diabetic Foot Exam - Simple   Simple Foot Form Diabetic Foot exam was performed with the following findings: Yes 07/24/2022  9:20 AM  Visual Inspection No deformities, no ulcerations, no other skin breakdown bilaterally: Yes Sensation Testing Intact to touch and monofilament testing bilaterally: Yes Pulse Check Posterior Tibialis and Dorsalis pulse intact bilaterally: Yes Comments Hair present on toes    ASSESSMENT/PLAN:   Diabetes mellitus, type 2 (HCC) A1c 6.4 - Urine microalbumin today - Diabetic foot exam done today - Discussed patient getting eye exam done - Continue Metformin '1000mg'$  BID - Pending results of urine, may need to consider SGLT2  HTN (hypertension), benign BP remains elevated in the office. BP being managed currently by cardiology office. Creatinine was elevated on last BMP, will re-check today. - BMP today - urine microalbumin - Patient to keep BP log and call  cardiology office if elevated     Rise Patience, [place]

## 2022-07-24 NOTE — Assessment & Plan Note (Addendum)
A1c 6.4 - Urine microalbumin today - Diabetic foot exam done today - Discussed patient getting eye exam done - Continue Metformin '1000mg'$  BID - Pending results of urine, may need to consider SGLT2

## 2022-07-24 NOTE — Patient Instructions (Addendum)
A1c today was 6.4, which is great!.  Make sure to get your diabetic eye exam with an ophthalmologist this year to make sure there is no eye damage from your diabetes.  Recommend getting this every single year.  We are getting urine and blood work just to check on how your kidney function is, this will help [place] determine if we need to adjust any of your medications.  Want to keep an eye on your blood pressure at home, I recommend checking it 1-2 times throughout the day and keeping a log of the numbers.  If they are persistently > 140/90, I want you to call your cardiologist office

## 2022-07-25 LAB — BASIC METABOLIC PANEL
BUN/Creatinine Ratio: 14 (ref 12–28)
BUN: 22 mg/dL (ref 8–27)
CO2: 20 mmol/L (ref 20–29)
Calcium: 10 mg/dL (ref 8.7–10.3)
Chloride: 103 mmol/L (ref 96–106)
Creatinine, Ser: 1.53 mg/dL — ABNORMAL HIGH (ref 0.57–1.00)
Glucose: 117 mg/dL — ABNORMAL HIGH (ref 70–99)
Potassium: 5 mmol/L (ref 3.5–5.2)
Sodium: 138 mmol/L (ref 134–144)
eGFR: 36 mL/min/{1.73_m2} — ABNORMAL LOW (ref >59)

## 2022-07-25 LAB — MICROALBUMIN / CREATININE URINE RATIO
Creatinine, Urine: 97.5 mg/dL
Microalb/Creat Ratio: 16 mg/g creat (ref 0–29)
Microalbumin, Urine: 15.5 ug/mL

## 2022-07-27 ENCOUNTER — Telehealth: Payer: Self-pay | Admitting: Family Medicine

## 2022-07-27 NOTE — Telephone Encounter (Signed)
Called patient about results, kidney function is overall stable and UACr was within normal range. Discussed with patient that we could consider starting and SGLT2, patient would like to defer this for now.  If she begins to have proteinuria, patient is aware that this medication would then be highly recommended for her to start.    Braya Habermehl, DO

## 2022-08-02 ENCOUNTER — Other Ambulatory Visit: Payer: Self-pay | Admitting: Family Medicine

## 2022-09-19 IMAGING — CT CT HEAD W/O CM
4 series · 16 of 47 positions shown, 18 images · non-contrast
Comparison: None.

CLINICAL DATA: 70-year-old female status post fall with pain.



[Series 3: head bone · axial · 0.41mm/px · z∈[-31,-3]mm · 3 of 73 slices shown]
[im 8/73  bone]
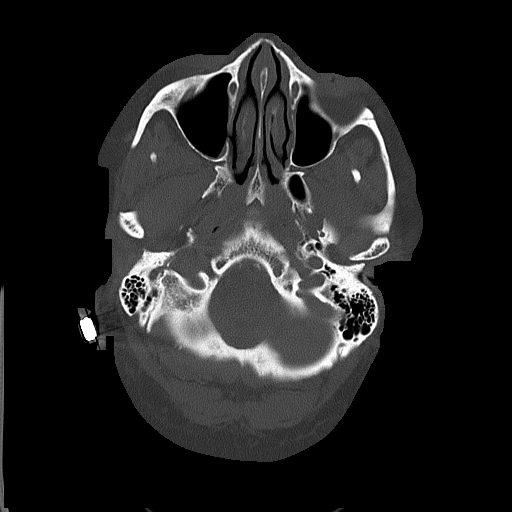
[im 15/73  bone]
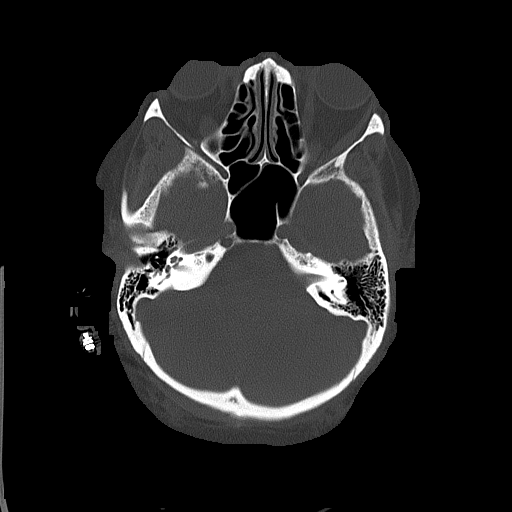
[im 22/73  bone]
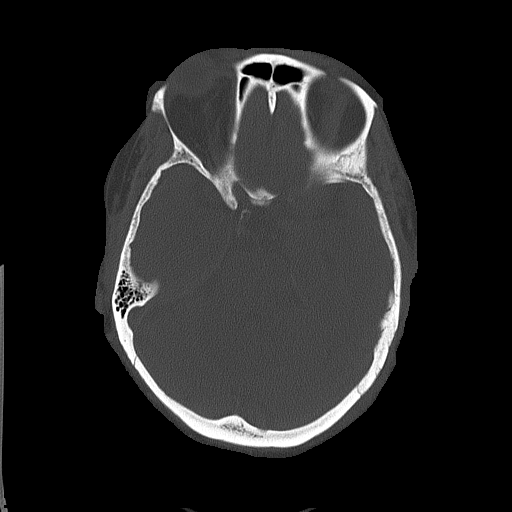

[Series 4: head without · axial · non-contrast · 0.41mm/px · z∈[-30,+80]mm · 7 of 30 slices shown, 9 images]
[im 4/30  brain]
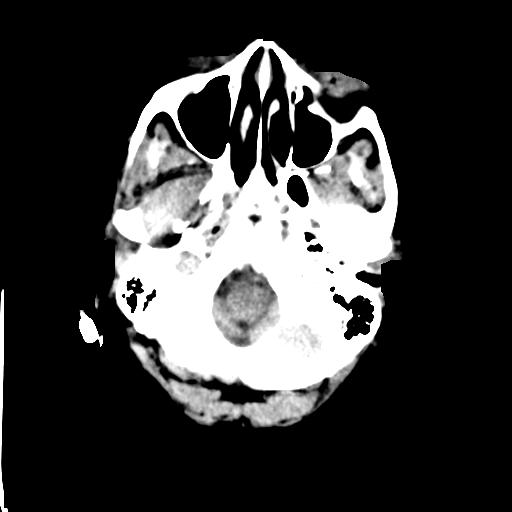
[im 4/30  bone]
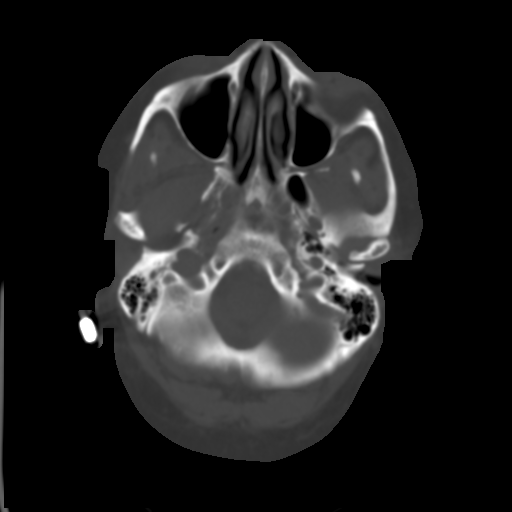
[im 8/30  brain]
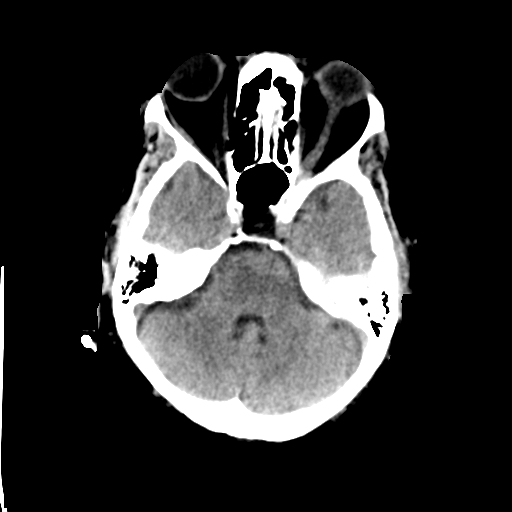
[im 11/30  brain]
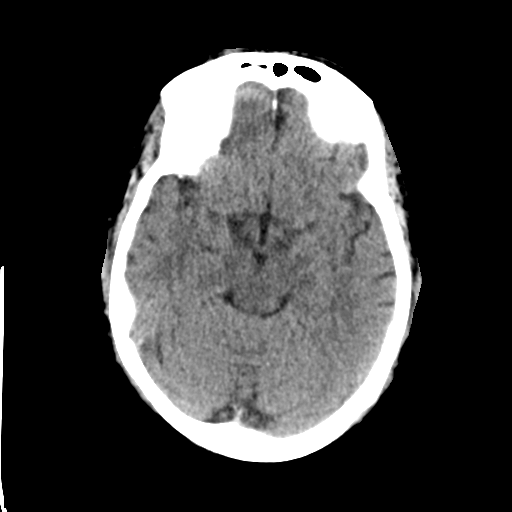
[im 15/30  brain]
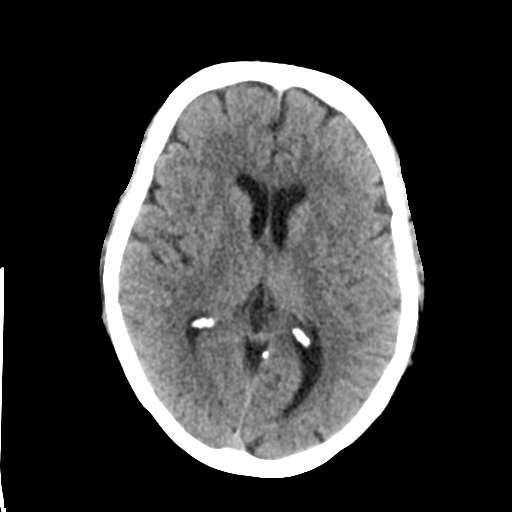
[im 19/30  brain]
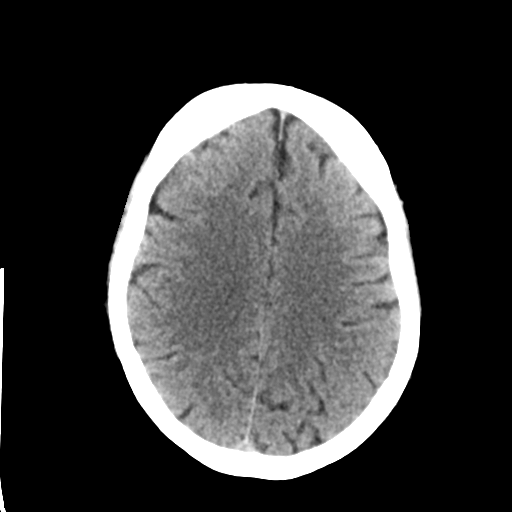
[im 19/30  bone]
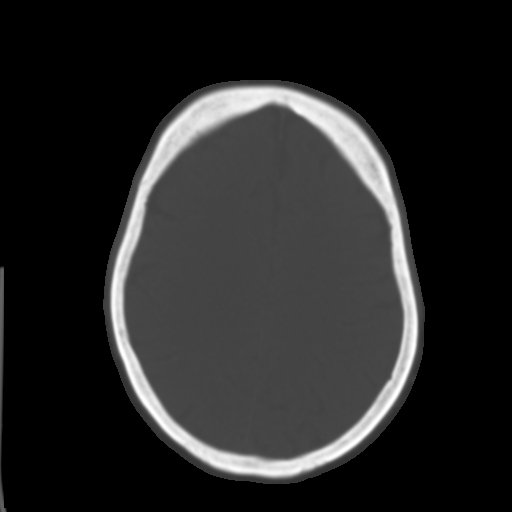
[im 22/30  brain]
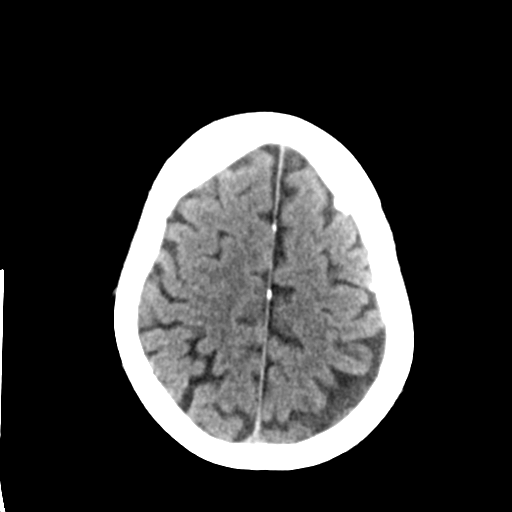
[im 26/30  brain]
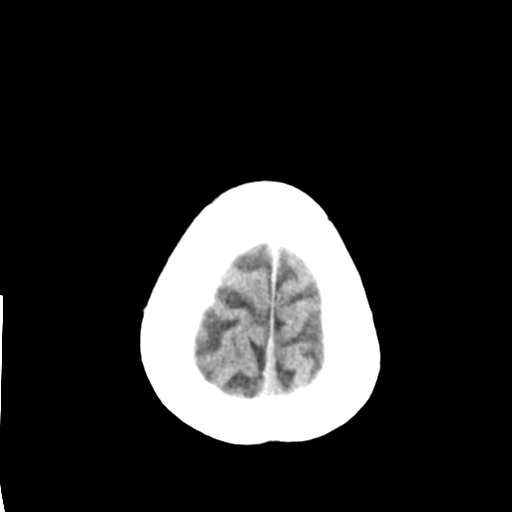

[Series 5: head without cor · coronal · non-contrast · 0.31mm/px · 3 of 63 slices shown]
[im 21/63  brain]
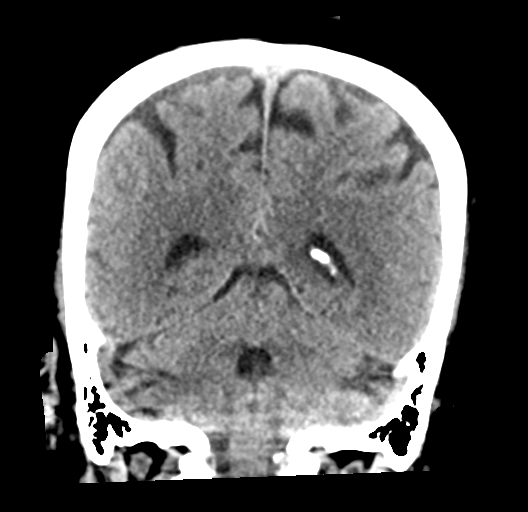
[im 28/63  brain]
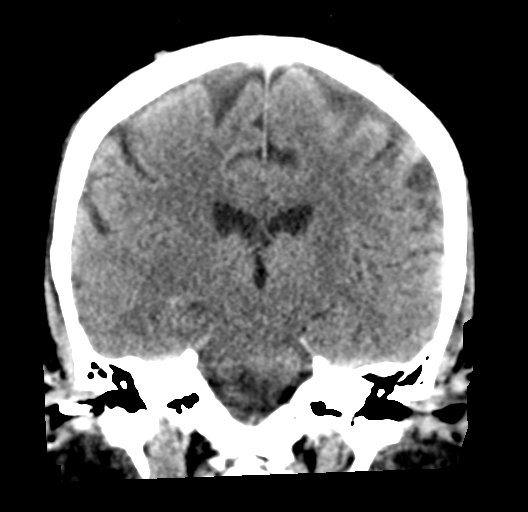
[im 35/63  brain]
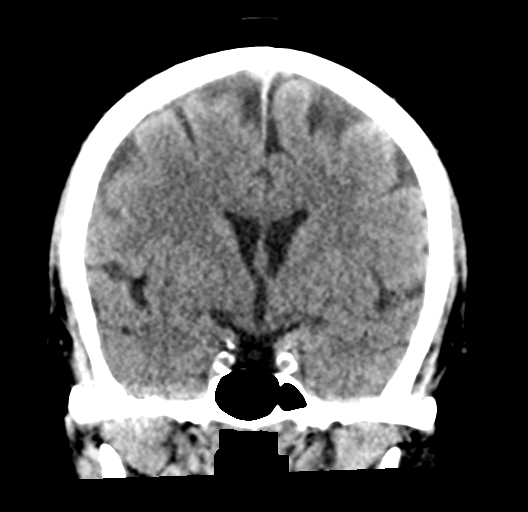

[Series 6: head without sag · sagittal · non-contrast · 0.31mm/px · 3 of 50 slices shown]
[im 17/50  brain]
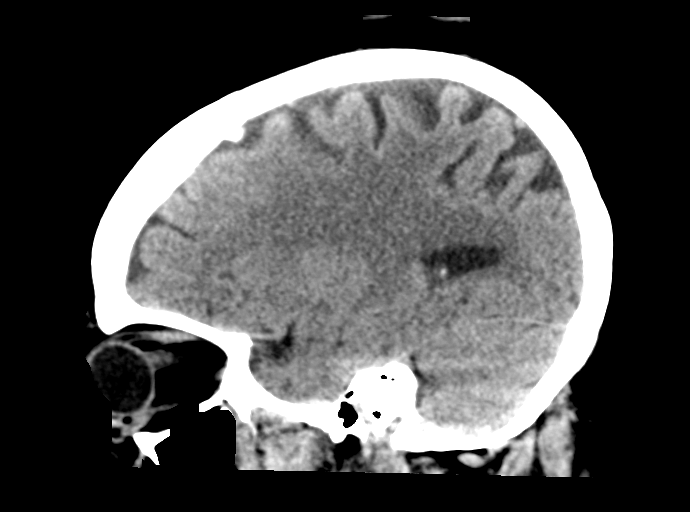
[im 25/50  brain]
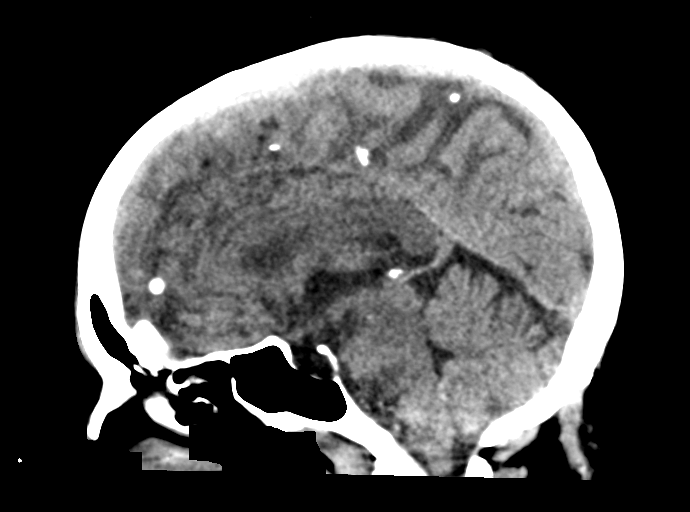
[im 33/50  brain]
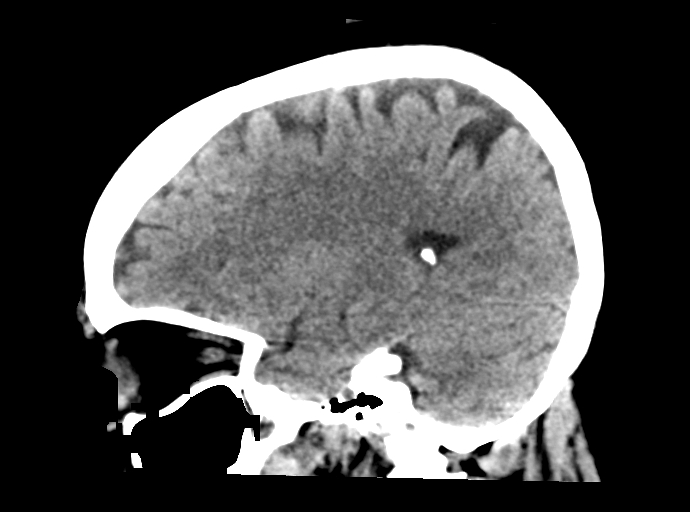

[16 of 47 positions shown; findings below may reference images not displayed]

FINDINGS: Brain: Cerebral volume is within normal limits for age. No midline
shift, ventriculomegaly, mass effect, evidence of mass lesion,
intracranial hemorrhage or evidence of cortically based acute
infarction. Gray-white matter differentiation is within normal
limits throughout the brain.

Vascular: Calcified atherosclerosis at the skull base. No suspicious
intracranial vascular hyperdensity.

Skull: No fracture identified.

Sinuses/Orbits: Visualized paranasal sinuses and mastoids are well
aerated. Right-sided hearing aid

Other: No orbit or scalp soft tissue injury identified.
IMPRESSION: Normal for age non contrast CT appearance of the brain. No acute
traumatic injury identified.

## 2022-09-19 IMAGING — DX DG ELBOW COMPLETE 3+V*R*
4 series · 4 of 4 positions shown · non-contrast
Comparison: None.

CLINICAL DATA: 70-year-old female status post fall with pain.

EXAM:
RIGHT ELBOW - COMPLETE 3+ VIEW

[x elbow ap right]
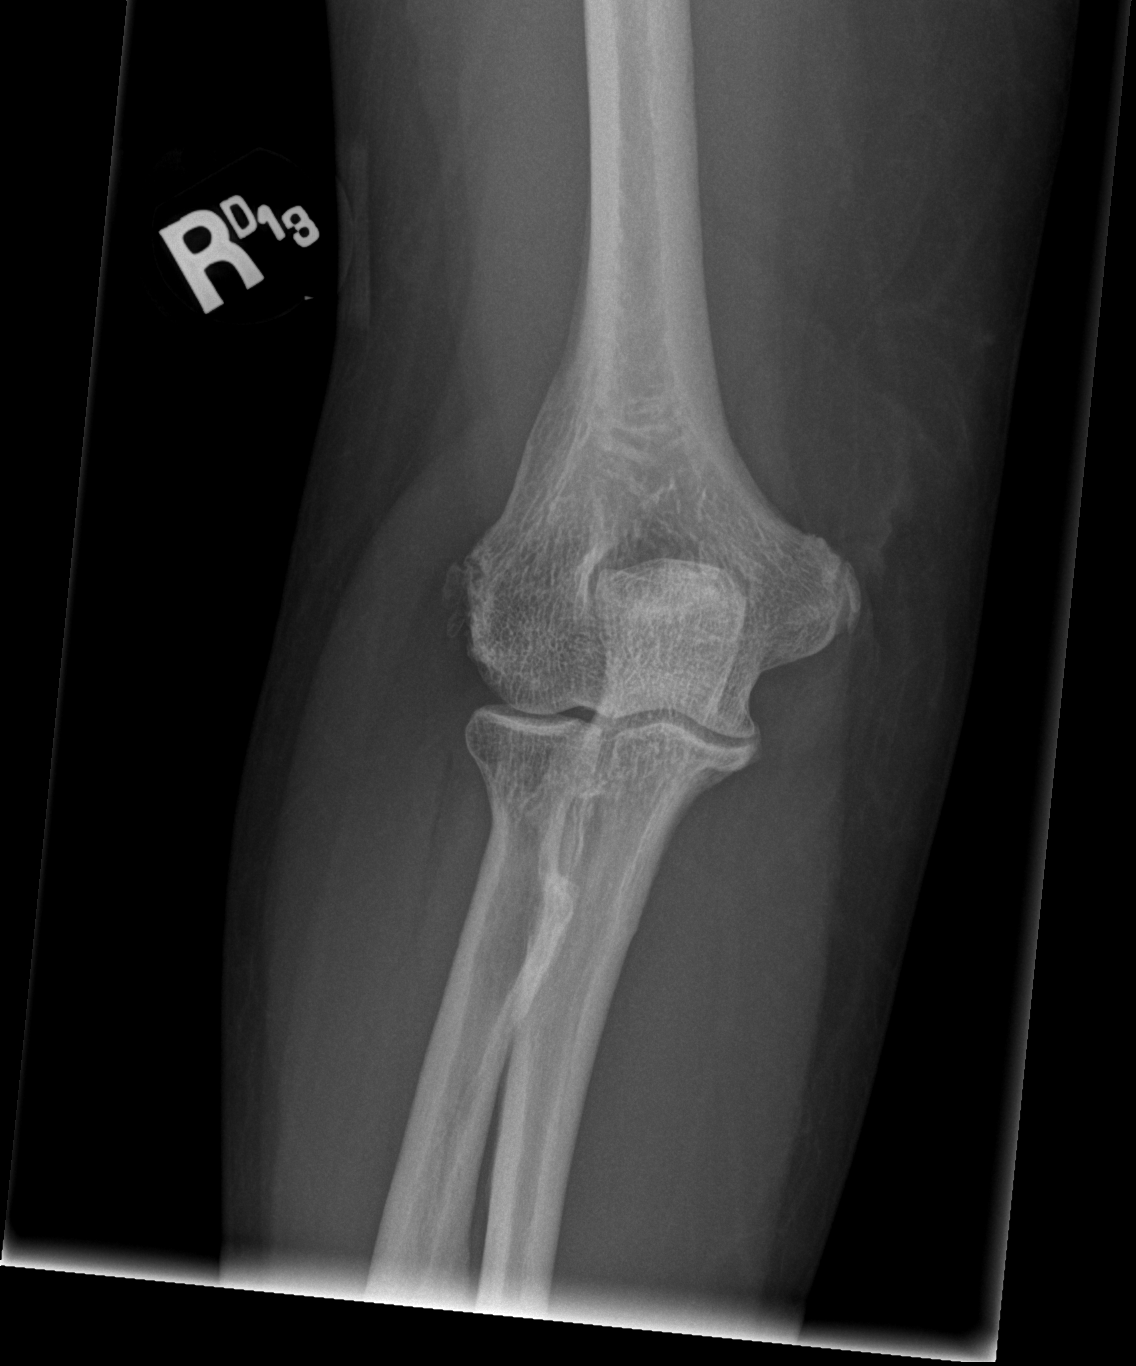

[x elbow obl right (1 of 2)]
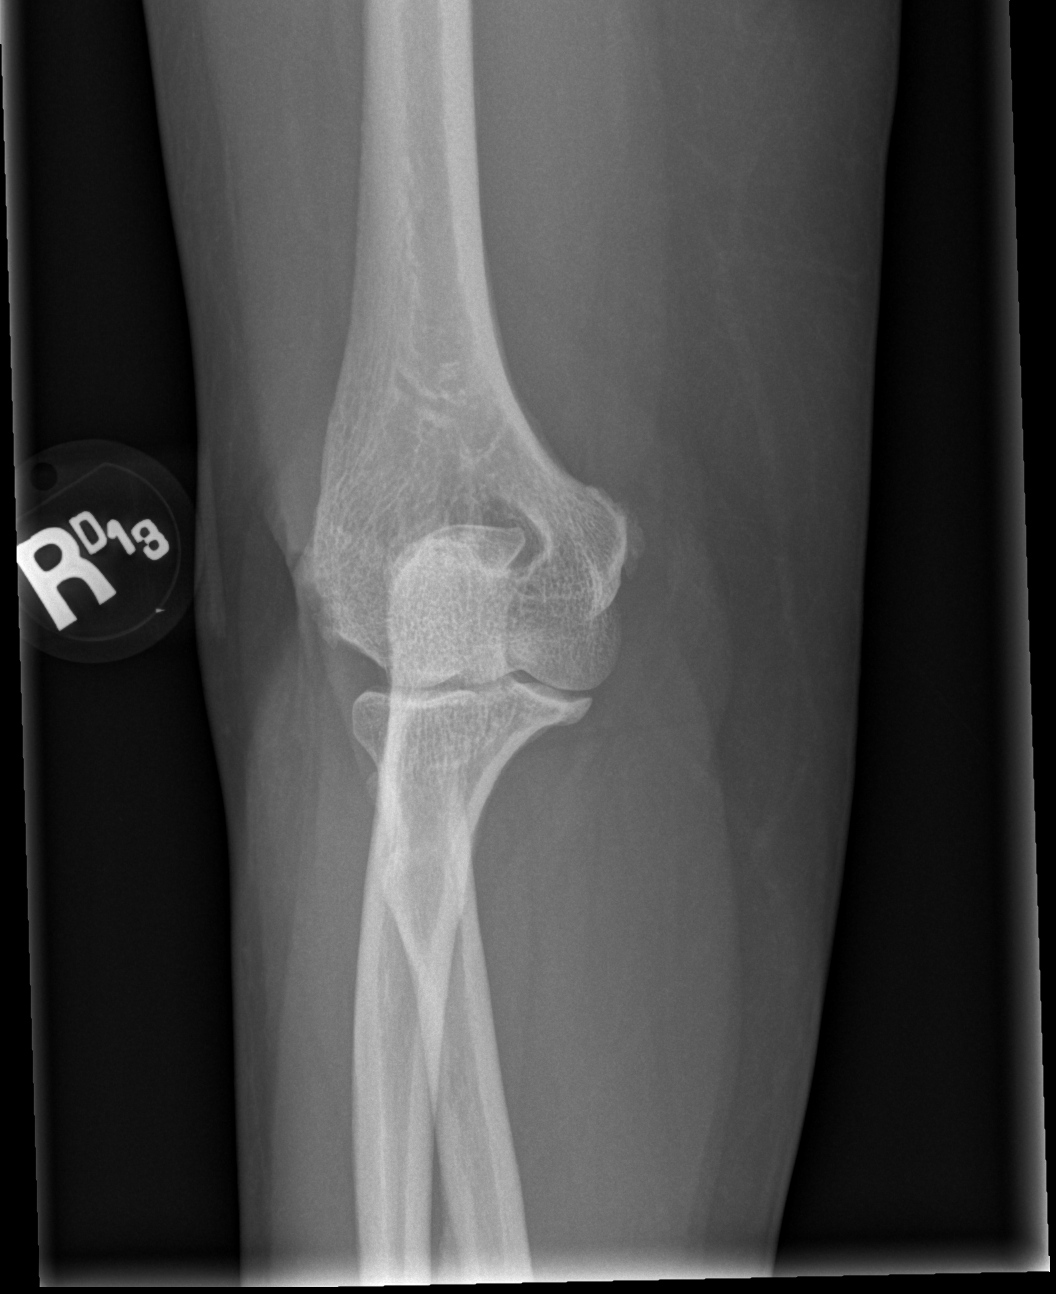

[x elbow obl right (2 of 2)]
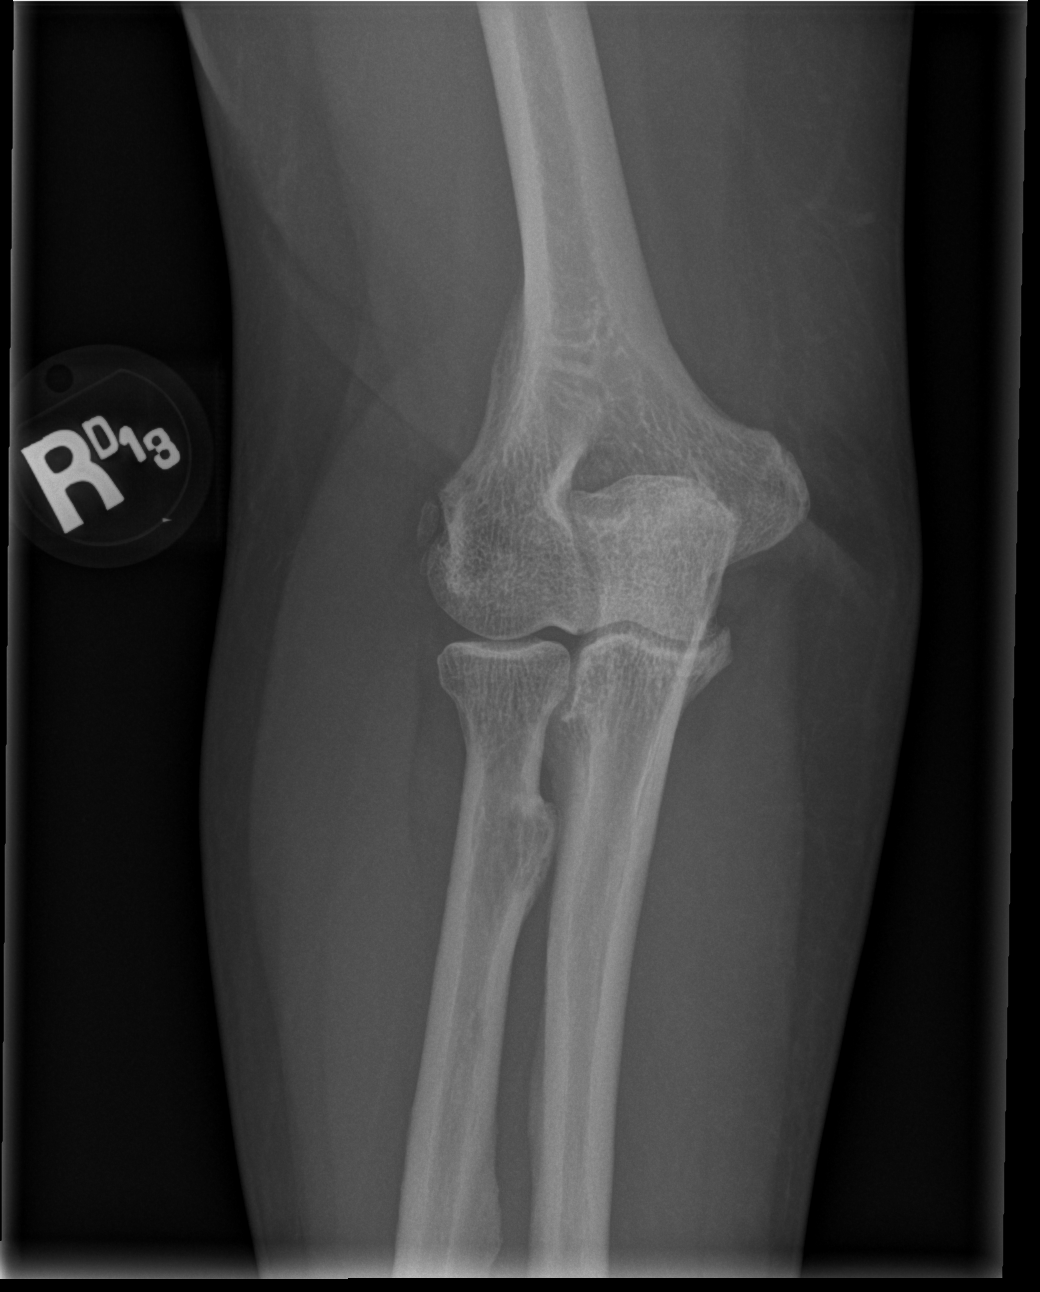

[x elbow lat right]
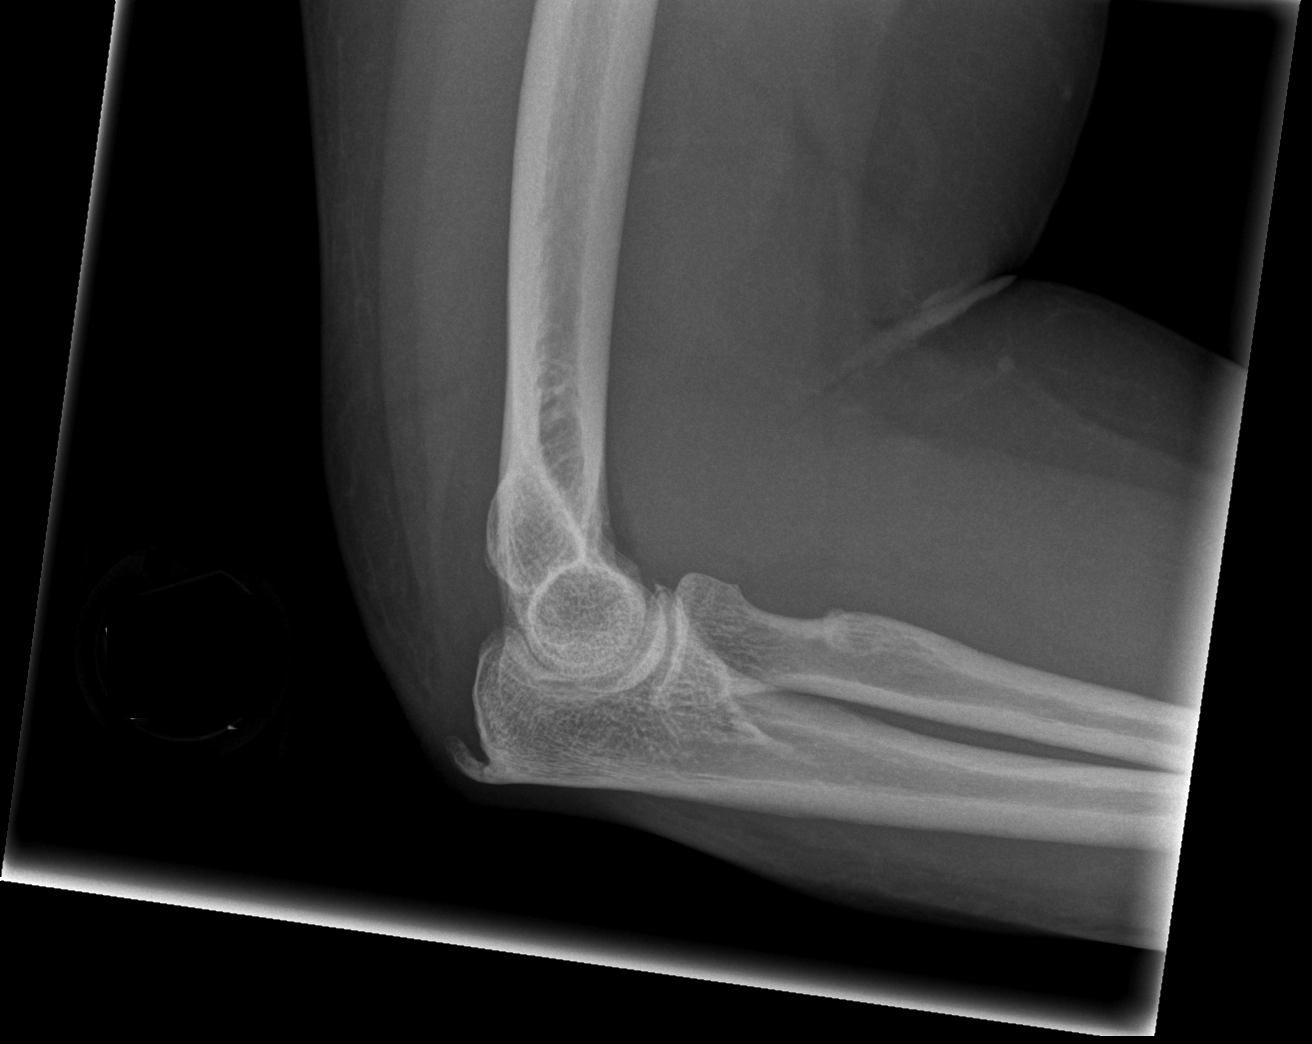

[4 of 4 positions shown; findings below may reference images not displayed]

FINDINGS: Bone mineralization is within normal limits for age. Normal
alignment. Degenerative spurring including at the olecranon. No
evidence of joint effusion. Radial head appears intact. Small
chronic lateral supracondylar fragments. No acute fracture or
dislocation identified.
IMPRESSION: No acute fracture or dislocation identified about the right elbow.

## 2022-11-10 ENCOUNTER — Other Ambulatory Visit: Payer: Self-pay | Admitting: Urology

## 2022-11-10 ENCOUNTER — Telehealth: Payer: Self-pay | Admitting: Cardiology

## 2022-11-10 NOTE — Telephone Encounter (Signed)
   Pre-operative Risk Assessment    Patient Name: Ashley Sloan  DOB: [birth year] MRN: [id number]      Request for Surgical Clearance    Procedure:   Cystoscopy transurethral resection of bladder tumor   Date of Surgery:  Clearance 11/30/22                                 Surgeon:  Dr. Everardo All Group or Practice Name:  [facility] Phone number:  [phone] Fax number:  [phone]   Type of Clearance Requested:   - Medical    Type of Anesthesia:  General    Additional requests/questions:      SignedCamilla, [place]   11/10/2022, 12:07 PM

## 2022-11-10 NOTE — Telephone Encounter (Signed)
   Name: Ashley Sloan  DOB: 1951-01-13  MRN: [id number]  Primary Cardiologist: Lesleigh Noe, MD (Inactive)  Chart reviewed as part of pre-operative protocol coverage. Because of Meredeth Mellema Steller's past medical history and time since last visit, she will require a follow-up telephone visit in order to better assess preoperative cardiovascular risk.  Pre-op covering staff: - Please schedule appointment and call patient to inform them. If patient already had an upcoming appointment within acceptable timeframe, please add "pre-op clearance" to the appointment notes so provider is aware. - Please contact requesting surgeon's office via preferred method (i.e, phone, fax) to inform them of need for appointment prior to surgery.  No medications indicated as needing held.   Sharlene Dory, PA-C  11/10/2022, 12:24 PM

## 2022-11-10 NOTE — Telephone Encounter (Signed)
Pt has appt 11/21/22 with Dr. Shari Prows. Pre op can be addressed at appt. I will update all parties involved.

## 2022-11-14 ENCOUNTER — Telehealth: Payer: Self-pay | Admitting: *Deleted

## 2022-11-14 ENCOUNTER — Ambulatory Visit (HOSPITAL_COMMUNITY): Payer: PPO | Attending: Internal Medicine

## 2022-11-14 DIAGNOSIS — I059 Rheumatic mitral valve disease, unspecified: Secondary | ICD-10-CM

## 2022-11-14 DIAGNOSIS — I340 Nonrheumatic mitral (valve) insufficiency: Secondary | ICD-10-CM

## 2022-11-14 DIAGNOSIS — I341 Nonrheumatic mitral (valve) prolapse: Secondary | ICD-10-CM | POA: Diagnosis not present

## 2022-11-14 LAB — ECHOCARDIOGRAM COMPLETE
Area-P 1/2: 2.73 cm2
MV M vel: 5.72 m/s
MV Peak grad: 130.9 mmHg
MV VTI: 0.8 cm2
S' Lateral: 2.3 cm

## 2022-11-14 NOTE — Telephone Encounter (Signed)
-----   Message from Meriam Sprague, MD sent at 11/14/2022  3:48 PM EDT ----- Her echo shows normal pumping function. 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 Will continue with annual echoes for monitoring.

## 2022-11-14 NOTE — Telephone Encounter (Signed)
The patient has been notified of the result and verbalized understanding.  All questions (if any) were answered.  Pt is aware that we will order for her to get a repeat echo in one year.  She is aware that we will send a message to our echo scheduler to call her back and arrange this appt for that time.  Pt verbalized understanding and agrees with this plan.

## 2022-11-16 NOTE — Patient Instructions (Signed)
DUE TO COVID-19 ONLY TWO VISITORS  (aged 75 and older)  ARE ALLOWED TO COME WITH YOU AND STAY IN THE WAITING ROOM ONLY DURING PRE OP AND PROCEDURE.   **NO VISITORS ARE ALLOWED IN THE SHORT STAY AREA OR RECOVERY ROOM!!**  IF YOU WILL BE ADMITTED INTO THE HOSPITAL YOU ARE ALLOWED ONLY FOUR SUPPORT PEOPLE DURING VISITATION HOURS ONLY (7 AM -8PM)   The support person(s) must pass our screening, gel in and out, and wear a mask at all times, including in the patient's room. Patients must also wear a mask when staff or their support person are in the room. Visitors GUEST BADGE MUST BE WORN VISIBLY  One adult visitor may remain with you overnight and MUST be in the room by 8 P.M.     Your procedure is scheduled on: 11/30/22   Report to [facility] Main Entrance    Report to admitting at : 6:45 AM   Call this number if you have problems the morning of surgery [phone]   Do not eat food :After Midnight.   Oral Hygiene is also important to reduce your risk of infection.                                    Remember - BRUSH YOUR TEETH THE MORNING OF SURGERY WITH YOUR REGULAR TOOTHPASTE  DENTURES WILL BE REMOVED PRIOR TO SURGERY PLEASE DO NOT APPLY "Poly grip" OR ADHESIVES!!!   Do NOT smoke after Midnight   Take these medicines the morning of surgery with A SIP OF WATER: carvedilol,amlodipine. How to Manage Your Diabetes Before and After Surgery  Why is it important to control my blood sugar before and after surgery? Improving blood sugar levels before and after surgery helps healing and can limit problems. A way of improving blood sugar control is eating a healthy diet by:  Eating less sugar and carbohydrates  Increasing activity/exercise  Talking with your doctor about reaching your blood sugar goals High blood sugars (greater than 180 mg/dL) can raise your risk of infections and slow your recovery, so you will need to focus on controlling your diabetes during the weeks before  surgery. Make sure that the doctor who takes care of your diabetes knows about your planned surgery including the date and location.  How do I manage my blood sugar before surgery? Check your blood sugar at least 4 times a day, starting 2 days before surgery, to make sure that the level is not too high or low. Check your blood sugar the morning of your surgery when you wake up and every 2 hours until you get to the Short Stay unit. If your blood sugar is less than 70 mg/dL, you will need to treat for low blood sugar: Do not take insulin. Treat a low blood sugar (less than 70 mg/dL) with  cup of clear juice (cranberry or apple), 4 glucose tablets, OR glucose gel. Recheck blood sugar in 15 minutes after treatment (to make sure it is greater than 70 mg/dL). If your blood sugar is not greater than 70 mg/dL on recheck, call [phone] for further instructions. Report your blood sugar to the short stay nurse when you get to Short Stay.  If you are admitted to the hospital after surgery: Your blood sugar will be checked by the staff and you will probably be given insulin after surgery (instead of oral diabetes medicines) to make sure  you have good blood sugar levels. The goal for blood sugar control after surgery is 80-180 mg/dL.   WHAT DO I DO ABOUT MY DIABETES MEDICATION?  THE NIGHT BEFORE SURGERY, take metformin as usual      THE MORNING OF SURGERY, DO NOT TAKE ANY ORAL DIABETIC MEDICATIONS DAY OF YOUR SURGERY                              You may not have any metal on your body including hair pins, jewelry, and body piercing             Do not wear make-up, lotions, powders, perfumes/cologne, or deodorant  Do not wear nail polish including gel and S&S, artificial/acrylic nails, or any other type of covering on natural nails including finger and toenails. If you have artificial nails, gel coating, etc. that needs to be removed by a nail salon please have this removed prior to surgery or  surgery may need to be canceled/ delayed if the surgeon/ anesthesia feels like they are unable to be safely monitored.   Do not shave  48 hours prior to surgery.    Do not bring valuables to the hospital. [facility] IS NOT             RESPONSIBLE   FOR VALUABLES.   Contacts, glasses, or bridgework may not be worn into surgery.   Bring small overnight bag day of surgery.   DO NOT BRING YOUR HOME MEDICATIONS TO THE HOSPITAL. PHARMACY WILL DISPENSE MEDICATIONS LISTED ON YOUR MEDICATION LIST TO YOU DURING YOUR ADMISSION IN THE HOSPITAL!    Patients discharged on the day of surgery will not be allowed to drive home.  Someone NEEDS to stay with you for the first 24 hours after anesthesia.   Special Instructions: Bring a copy of your healthcare power of attorney and living will documents         the day of surgery if you haven't scanned them before.              Please read over the following fact sheets you were given: IF YOU HAVE QUESTIONS ABOUT YOUR PRE-OP INSTRUCTIONS PLEASE CALL [phone]    [facility] - Preparing for Surgery Before surgery, you can play an important role.  Because skin is not sterile, your skin needs to be as free of germs as possible.  You can reduce the number of germs on your skin by washing with CHG (chlorahexidine gluconate) soap before surgery.  CHG is an antiseptic cleaner which kills germs and bonds with the skin to continue killing germs even after washing. Please DO NOT use if you have an allergy to CHG or antibacterial soaps.  If your skin becomes reddened/irritated stop using the CHG and inform your nurse when you arrive at Short Stay. Do not shave (including legs and underarms) for at least 48 hours prior to the first CHG shower.  You may shave your face/neck. Please follow these instructions carefully:  1.  Shower with CHG Soap the night before surgery and the  morning of Surgery.  2.  If you choose to wash your hair, wash your hair first as usual with  your  normal  shampoo.  3.  After you shampoo, rinse your hair and body thoroughly to remove the  shampoo.  4.  Use CHG as you would any other liquid soap.  You can apply chg directly  to the skin and wash                       Gently with a scrungie or clean washcloth.  5.  Apply the CHG Soap to your body ONLY FROM THE NECK DOWN.   Do not use on face/ open                           Wound or open sores. Avoid contact with eyes, ears mouth and genitals (private parts).                       Wash face,  Genitals (private parts) with your normal soap.             6.  Wash thoroughly, paying special attention to the area where your surgery  will be performed.  7.  Thoroughly rinse your body with warm water from the neck down.  8.  DO NOT shower/wash with your normal soap after using and rinsing off  the CHG Soap.                9.  Pat yourself dry with a clean towel.            10.  Wear clean pajamas.            11.  Place clean sheets on your bed the night of your first shower and do not  sleep with pets. Day of Surgery : Do not apply any lotions/deodorants the morning of surgery.  Please wear clean clothes to the hospital/surgery center.  FAILURE TO FOLLOW THESE INSTRUCTIONS MAY RESULT IN THE CANCELLATION OF YOUR SURGERY PATIENT SIGNATURE_________________________________  NURSE SIGNATURE__________________________________  ________________________________________________________________________

## 2022-11-19 NOTE — Progress Notes (Unsigned)
Cardiology Office Note:   Date:  11/21/2022  ID:  Ashley Sloan, DOB Sep 28, 1950, MRN [id number]  History of Present Illness:   Ashley Sloan is a 72 y.o. female with history of DMII, HTN, HLD, mild to moderate MR, and mild MS who was previously followed by Dr. Katrinka Blazing who now returns to clinic for follow-up.  Patient noted to have significant MR on TTE in 01/2022 in the setting of poorly controlled HTN. Repeat TTE 11/2022 showed mild to moderate MR after improvement of the SBP.   Today, the patient overall feels well. No chest pain, SOB, orthopnea, or PND. Has trace LE edema which is chronic. Blood pressure is fluctuating but goes as high as 170s prior to taking her medications in the morning. No palpitations, lightheadedness, dizziness or syncope. Compliant with medications.   Planned for cystoscopy with resection of bladder tumor. She is able to complete >4METs without exertional symptoms. No known history of CAD or HF.   Past Medical History:  Diagnosis Date   Arthritis    hands   Cancer (HCC) 1991   Breast-left   Diabetes mellitus    Hyperlipidemia    Hypertension    Left breast mass      ROS: As per HPI  Studies Reviewed:    EKG:  NSR, PAC, RBBB, LVH HR 65-personally reviewed  Cardiac Studies & Procedures       ECHOCARDIOGRAM  ECHOCARDIOGRAM COMPLETE 11/14/2022  Narrative ECHOCARDIOGRAM REPORT    Patient Name:   Ashley Sloan Date of Exam: 11/14/2022 Medical Rec #:  [id number]      Height:       60.0 in Accession #:    [id number]     Weight:       162.6 lb Date of Birth:  1950-12-23      BSA:          1.709 m Patient Age:    72 years       BP:           132/61 mmHg Patient Gender: F              HR:           60 bpm. Exam Location:  [address]  Procedure: 2D Echo, 3D Echo, Cardiac Doppler and Color Doppler  Indications:    I05.9 Rheumatic Heart Disease (Mitral Valve Insufficiency)  History:        Patient has prior history of Echocardiogram examinations,  most recent 02/06/2022. Mitral Valve Disease, Signs/Symptoms:Edema; Risk Factors:Former Smoker, Hypertension, Diabetes, Dyslipidemia and Family History of Coronary Artery Disease. Left Breast Cancer (1991, status post Lumpectomy with Chemotherapy and Radiation), Renal Carcinoma Status post Nephrectomy with Chemotherapy and Radiation (2022).  Sonographer:    Farrel Conners RDCS Referring Phys: Verdis Prime, W  IMPRESSIONS   1. Left ventricular ejection fraction, by estimation, is 65 to 70%. Left ventricular ejection fraction by 3D volume is 66 %. The left ventricle has normal function. The left ventricle has no regional wall motion abnormalities. Left ventricular diastolic parameters are indeterminate. 2. Right ventricular systolic function is normal. The right ventricular size is normal. There is mildly elevated pulmonary artery systolic pressure. The estimated right ventricular systolic pressure is 37.6 mmHg. 3. Left atrial size was mildly dilated. 4. Right atrial size was mildly dilated. 5. 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 Mild to moderate mitral valve regurgitation, improved compared to prior exam with normotension. Mild mitral stenosis. The mean  mitral valve gradient is 6.0 mmHg with average heart rate of 65 bpm. Moderate to severe mitral annular calcification. 6. Tricuspid valve regurgitation is moderate. 7. The aortic valve is tricuspid. Aortic valve regurgitation is not visualized. No aortic stenosis is present. 8. The inferior vena cava is normal in size with greater than 50% respiratory variability, suggesting right atrial pressure of 3 mmHg.  FINDINGS Left Ventricle: Left ventricular ejection fraction, by estimation, is 65 to 70%. Left ventricular ejection fraction by 3D volume is 66 %. The left ventricle has normal function. The left ventricle has no regional wall motion abnormalities. The left ventricular internal cavity size was  normal in size. There is no left ventricular hypertrophy. Left ventricular diastolic parameters are indeterminate.  Right Ventricle: The right ventricular size is normal. No increase in right ventricular wall thickness. Right ventricular systolic function is normal. There is mildly elevated pulmonary artery systolic pressure. The tricuspid regurgitant velocity is 2.94 m/s, and with an assumed right atrial pressure of 3 mmHg, the estimated right ventricular systolic pressure is 37.6 mmHg.  Left Atrium: Left atrial size was mildly dilated.  Right Atrium: Right atrial size was mildly dilated.  Pericardium: There is no evidence of pericardial effusion.  Mitral Valve: The mitral valve is abnormal. There is moderate thickening of the mitral valve leaflet(s). There is mild calcification of the mitral valve leaflet(s). Moderate to severe mitral annular calcification. Mild to moderate mitral valve regurgitation. Mild mitral valve stenosis. MV peak gradient, 19.9 mmHg. The mean mitral valve gradient is 6.0 mmHg with average heart rate of 65 bpm.  Tricuspid Valve: The tricuspid valve is normal in structure. Tricuspid valve regurgitation is moderate . No evidence of tricuspid stenosis.  Aortic Valve: The aortic valve is tricuspid. Aortic valve regurgitation is not visualized. No aortic stenosis is present.  Pulmonic Valve: The pulmonic valve was normal in structure. Pulmonic valve regurgitation is trivial. No evidence of pulmonic stenosis.  Aorta: The aortic root is normal in size and structure.  Venous: The inferior vena cava is normal in size with greater than 50% respiratory variability, suggesting right atrial pressure of 3 mmHg.  IAS/Shunts: No atrial level shunt detected by color flow Doppler.   LEFT VENTRICLE PLAX 2D LVIDd:         3.90 cm         Diastology LVIDs:         2.30 cm         LV e' medial:    4.08 cm/s LV PW:         0.80 cm         LV E/e' medial:  43.4 LV IVS:        0.90 cm          LV e' lateral:   8.59 cm/s LVOT diam:     1.60 cm         LV E/e' lateral: 20.6 LV SV:         54 LV SV Index:   32 LVOT Area:     2.01 cm        3D Volume EF LV 3D EF:    Left ventricul ar ejection fraction by 3D volume is 66 %.  3D Volume EF: 3D EF:        66 % LV EDV:       87 ml LV ESV:       29 ml LV SV:        58 ml  RIGHT VENTRICLE RV Basal diam:  3.60 cm RV S prime:     12.00 cm/s TAPSE (M-mode): 1.8 cm RVSP:           37.6 mmHg  LEFT ATRIUM             Index        RIGHT ATRIUM           Index LA diam:        3.40 cm 1.99 cm/m   RA Pressure: 3.00 mmHg LA Vol (A2C):   35.5 ml 20.77 ml/m  RA Area:     12.20 cm LA Vol (A4C):   64.6 ml 37.79 ml/m  RA Volume:   29.40 ml  17.20 ml/m LA Biplane Vol: 52.6 ml 30.77 ml/m AORTIC VALVE LVOT Vmax:   107.50 cm/s LVOT Vmean:  73.900 cm/s LVOT VTI:    0.270 m  AORTA Ao Root diam: 2.50 cm Ao Asc diam:  3.10 cm  MITRAL VALVE                TRICUSPID VALVE MV Area (PHT): cm          TR Peak grad:   34.6 mmHg MV Area VTI:   0.80 cm     TR Vmax:        294.00 cm/s MV Peak grad:  19.9 mmHg    Estimated RAP:  3.00 mmHg MV Mean grad:  6.0 mmHg     RVSP:           37.6 mmHg MV Vmax:       2.23 m/s MV Vmean:      115.0 cm/s   SHUNTS MV Decel Time: 278 msec     Systemic VTI:  0.27 m MR Peak grad: 130.9 mmHg    Systemic Diam: 1.60 cm MR Mean grad: 87.8 mmHg MR Vmax:      572.00 cm/s MR Vmean:     448.5 cm/s MV E velocity: 177.00 cm/s MV A velocity: 138.00 cm/s MV E/A ratio:  1.28  Weston Brass MD Electronically signed by Weston Brass MD Signature Date/Time: 11/14/2022/1:22:46 PM    Final              Risk Assessment/Calculations:              Physical Exam:   VS:  BP 134/68   Pulse 63   Ht 5' (1.524 m)   Wt 163 lb 9.6 oz (74.2 kg)   SpO2 99%   BMI 31.95 kg/m    Wt Readings from Last 3 Encounters:  11/21/22 163 lb 9.6 oz (74.2 kg)  07/24/22 162 lb 9.6 oz (73.8 kg)  07/11/22 160 lb  6.4 oz (72.8 kg)     GEN: Well nourished, well developed in no acute distress NECK: No JVD; No carotid bruits CARDIAC: RRR, 2/6 systolic murmur RESPIRATORY:  Clear to auscultation without rales, wheezing or rhonchi  ABDOMEN: Soft, non-tender, non-distended EXTREMITIES:  No edema; No deformity   ASSESSMENT AND PLAN:    #Pre-Op Prior to Cystoscopy with Resection of Bladder Tumor: -Patient is active and able to complete >4METs without issue -Recent TTE with interval improvement in MR and pulmonary pressures with BP control -No further testing prior to OR -Revised cardiac risk index 0.4%  #HTN: -Running high in the morning prior to taking medications -Increase amlodipine to 5mg  BID -Continue coreg 25mg  BID -Continue losartan 100mg  daily -Continue spironolactone 25mg  daily  #Mild to Moderate MR: #Mild MS: #Rheumatic valve disease: -  Continue BP control as above -Serial echoes every 1-2 years  #Pulmonary HTN: -Improved on current TTE with improvement in BP -Continue BP control and management of valve disease as above  #DMII: -Continue metformin 1000mg  BID -A1C 6.4  #HLD: -Continue lipitor 40mg  daily -Lipids monitored by PCP  #Frequent PACs on ECG: -Denies palpitations -Continue coreg 25mg  BID        Signed, Meriam Sprague, MD

## 2022-11-21 ENCOUNTER — Other Ambulatory Visit: Payer: Self-pay

## 2022-11-21 ENCOUNTER — Encounter (HOSPITAL_COMMUNITY): Payer: Self-pay

## 2022-11-21 ENCOUNTER — Encounter: Payer: Self-pay | Admitting: Cardiology

## 2022-11-21 ENCOUNTER — Encounter
Admission: RE | Admit: 2022-11-21 | Discharge: 2022-11-21 | Disposition: A | Discharge status: Home / Self Care | Payer: PPO | Source: Ambulatory Visit | Attending: Cardiology | Admitting: Cardiology

## 2022-11-21 ENCOUNTER — Ambulatory Visit: Payer: PPO | Admitting: Cardiology

## 2022-11-21 VITALS — BP 134/68 | HR 63 | Ht 60.0 in | Wt 163.6 lb

## 2022-11-21 VITALS — BP 173/74 | HR 71 | Temp 98.5°F | Ht 60.0 in | Wt 160.0 lb

## 2022-11-21 DIAGNOSIS — D494 Neoplasm of unspecified behavior of bladder: Secondary | ICD-10-CM | POA: Insufficient documentation

## 2022-11-21 DIAGNOSIS — Z01818 Encounter for other preprocedural examination: Secondary | ICD-10-CM | POA: Insufficient documentation

## 2022-11-21 DIAGNOSIS — Z87891 Personal history of nicotine dependence: Secondary | ICD-10-CM | POA: Diagnosis not present

## 2022-11-21 DIAGNOSIS — I272 Pulmonary hypertension, unspecified: Secondary | ICD-10-CM

## 2022-11-21 DIAGNOSIS — E785 Hyperlipidemia, unspecified: Secondary | ICD-10-CM | POA: Diagnosis not present

## 2022-11-21 DIAGNOSIS — E119 Type 2 diabetes mellitus without complications: Secondary | ICD-10-CM | POA: Insufficient documentation

## 2022-11-21 DIAGNOSIS — E1159 Type 2 diabetes mellitus with other circulatory complications: Secondary | ICD-10-CM | POA: Diagnosis not present

## 2022-11-21 DIAGNOSIS — Z7984 Long term (current) use of oral hypoglycemic drugs: Secondary | ICD-10-CM

## 2022-11-21 DIAGNOSIS — I340 Nonrheumatic mitral (valve) insufficiency: Secondary | ICD-10-CM

## 2022-11-21 DIAGNOSIS — I10 Essential (primary) hypertension: Secondary | ICD-10-CM

## 2022-11-21 DIAGNOSIS — I081 Rheumatic disorders of both mitral and tricuspid valves: Secondary | ICD-10-CM | POA: Diagnosis not present

## 2022-11-21 DIAGNOSIS — R609 Edema, unspecified: Secondary | ICD-10-CM | POA: Insufficient documentation

## 2022-11-21 DIAGNOSIS — Z01810 Encounter for preprocedural cardiovascular examination: Secondary | ICD-10-CM

## 2022-11-21 DIAGNOSIS — I059 Rheumatic mitral valve disease, unspecified: Secondary | ICD-10-CM

## 2022-11-21 LAB — CBC
HCT: 36.1 % (ref 36.0–46.0)
Hemoglobin: 11.8 g/dL — ABNORMAL LOW (ref 12.0–15.0)
MCH: 29.6 pg (ref 26.0–34.0)
MCHC: 32.7 g/dL (ref 30.0–36.0)
MCV: 90.5 fL (ref 80.0–100.0)
Platelets: 221 10*3/uL (ref 150–400)
RBC: 3.99 MIL/uL (ref 3.87–5.11)
RDW: 12.8 % (ref 11.5–15.5)
WBC: 8.8 10*3/uL (ref 4.0–10.5)
nRBC: 0 % (ref 0.0–0.2)

## 2022-11-21 LAB — HEMOGLOBIN A1C
Hgb A1c MFr Bld: 6.9 % — ABNORMAL HIGH (ref 4.8–5.6)
Mean Plasma Glucose: 151.33 mg/dL

## 2022-11-21 LAB — BASIC METABOLIC PANEL
Anion gap: 9 (ref 5–15)
BUN: 23 mg/dL (ref 8–23)
CO2: 22 mmol/L (ref 22–32)
Calcium: 9.2 mg/dL (ref 8.9–10.3)
Chloride: 107 mmol/L (ref 98–111)
Creatinine, Ser: 1.75 mg/dL — ABNORMAL HIGH (ref 0.44–1.00)
GFR, Estimated: 30 mL/min — ABNORMAL LOW (ref >60)
Glucose, Bld: 215 mg/dL — ABNORMAL HIGH (ref 70–99)
Potassium: 3.8 mmol/L (ref 3.5–5.1)
Sodium: 138 mmol/L (ref 135–145)

## 2022-11-21 LAB — GLUCOSE, CAPILLARY: Glucose-Capillary: 222 mg/dL — ABNORMAL HIGH (ref 70–99)

## 2022-11-21 MED ORDER — AMLODIPINE BESYLATE 5 MG PO TABS: 5.0000 mg | ORAL_TABLET | Freq: Two times a day (BID) | ORAL | 2 refills | Status: DC

## 2022-11-21 MED ORDER — SPIRONOLACTONE 25 MG PO TABS
25.0000 mg | ORAL_TABLET | Freq: Every day | ORAL | 3 refills | Status: DC
Start: 2022-11-21 — End: 2024-01-09

## 2022-11-21 NOTE — Progress Notes (Signed)
Lab. results Creatinine: 1.75

## 2022-11-21 NOTE — Telephone Encounter (Signed)
Hayde, Condren - 11/10/2022 12:07 PM Meriam Sprague, MD  Sent: Tue Nov 21, 2022  3:58 PM  To: [facility]; Loa Socks, LPN         Message  Patient seen and examined. No further testing needed prior to her procedure     Will route this note back to the pre-op pool for any final notes and to route to the requesting Surgeon's office.

## 2022-11-21 NOTE — Patient Instructions (Signed)
Medication Instructions:   INCREASE YOUR AMLODIPINE TO 5 MG BY MOUTH TWICE DAILY  *If you need a refill on your cardiac medications before your next appointment, please call your pharmacy*   Follow-Up: At [facility], you and your health needs are our priority.  As part of our continuing mission to provide you with exceptional heart care, we have created designated Provider Care Teams.  These Care Teams include your primary Cardiologist (physician) and Advanced Practice Providers (APPs -  Physician Assistants and Nurse Practitioners) who all work together to provide you with the care you need, when you need it.  We recommend signing up for the patient portal called "MyChart".  Sign up information is provided on this After Visit Summary.  MyChart is used to connect with patients for Virtual Visits (Telemedicine).  Patients are able to view lab/test results, encounter notes, upcoming appointments, etc.  Non-urgent messages can be sent to your provider as well.   To learn more about what you can do with MyChart, go to [email or web address].    Your next appointment:   6 month(s)  Provider:   Jari Favre, PA-C, Ronie Spies, PA-C, Robin Searing, NP, Jacolyn Reedy, PA-C, Eligha Bridegroom, NP, or Tereso Newcomer, PA-C

## 2022-11-21 NOTE — Progress Notes (Addendum)
For Short Stay: COVID SWAB appointment date:  Bowel Prep reminder:   For Anesthesia: PCP - DO: Reece Leader Cardiologist - Dr. Laurance Flatten. LOV: 11/21/22.: Clearance.  Chest x-ray - 01/02/22 EKG - 11/21/22 Stress Test -  ECHO - 11/14/22 Cardiac Cath -  Pacemaker/ICD device last checked: Pacemaker orders received: Device Rep notified:  Spinal Cord Stimulator: N/A  Sleep Study - N/A CPAP -   Fasting Blood Sugar - N/A Checks Blood Sugar ___0__ times a day Date and result of last Hgb A1c- 6.4: 07/24/22  Last dose of GLP1 agonist- N/A GLP1 instructions:   Last dose of SGLT-2 inhibitors- N/A SGLT-2 instructions:   Blood Thinner Instructions: N/A Aspirin Instructions: Last Dose:  Activity level: Can go up a flight of stairs and activities of daily living without stopping and without chest pain and/or shortness of breath   Able to exercise without chest pain and/or shortness of breath  Anesthesia review: HTN,DIA,Mitral valve abnormalities.  Patient denies shortness of breath, fever, cough and chest pain at PAT appointment   Patient verbalized understanding of instructions that were given to them at the PAT appointment. Patient was also instructed that they will need to review over the PAT instructions again at home before surgery.

## 2022-11-22 NOTE — Telephone Encounter (Signed)
Pre-op clearance note faxed to Dr. Vevelyn Royals office with [facility], via epic fax function.

## 2022-11-23 NOTE — Progress Notes (Signed)
Anesthesia Chart Review   Case: [id number] Date/Time: 11/30/22 0845   Procedures:      TRANSURETHRAL RESECTION OF BLADDER TUMOR (TURBT) - 60 MINUTES NEEDED FOR CASE     CYSTOSCOPY   Anesthesia type: General   Pre-op diagnosis: BLADDER TUMORS   Location: [facility]   Surgeons: Heloise Purpura, MD       DISCUSSION: 72 year old former smoker with history of HTN, DM, mild to moderate MR, left breast cancer, bladder tumors scheduled for above procedure 11/30/2022 with Dr. Heloise Purpura.  Patient seen by cardiology 11/21/2022.  Patient with mild to moderate MR, possible rheumatic valve disease versus degenerative calcific changes, she is to have serial echoes every 1 to 2 years.  Per office visit note,  "-Patient is active and able to complete >4METs without issue -Recent TTE with interval improvement in MR and pulmonary pressures with BP control -No further testing prior to OR -Revised cardiac risk index 0.4%"  Anticipate pt can proceed with planned procedure barring acute status change.   VS: BP (!) 173/74   Pulse 71   Temp 36.9 C (Oral)   Ht 5' (1.524 m)   Wt 72.6 kg   SpO2 99%   BMI 31.25 kg/m   PROVIDERS: Reece Leader, DO is PCP  Cardiologist - Dr. Laurance Flatten  LABS: Labs reviewed: Acceptable for surgery. (all labs ordered are listed, but only abnormal results are displayed)  Labs Reviewed  HEMOGLOBIN A1C - Abnormal; Notable for the following components:      Result Value   Hgb A1c MFr Bld 6.9 (*)    All other components within normal limits  BASIC METABOLIC PANEL - Abnormal; Notable for the following components:   Glucose, Bld 215 (*)    Creatinine, Ser 1.75 (*)    GFR, Estimated 30 (*)    All other components within normal limits  CBC - Abnormal; Notable for the following components:   Hemoglobin 11.8 (*)    All other components within normal limits  GLUCOSE, CAPILLARY - Abnormal; Notable for the following components:   Glucose-Capillary 222  (*)    All other components within normal limits     IMAGES:   EKG:   CV: Echo 11/14/2022 1. Left ventricular ejection fraction, by estimation, is 65 to 70%. Left  ventricular ejection fraction by 3D volume is 66 %. The left ventricle has  normal function. The left ventricle has no regional wall motion  abnormalities. Left ventricular diastolic   parameters are indeterminate.   2. Right ventricular systolic function is normal. The right ventricular  size is normal. There is mildly elevated pulmonary artery systolic  pressure. The estimated right ventricular systolic pressure is 37.6 mmHg.   3. Left atrial size was mildly dilated.   4. Right atrial size was mildly dilated.   5. The mitral valve is abnormal, and has been previously described as  rheumatic. Mild to moderate mitral valve regurgitation, improved compared  to prior exam with normotension. Mild mitral stenosis. The mean mitral  valve gradient is 6.0 mmHg with  average heart rate of 65 bpm. Moderate to severe mitral annular  calcification.   6. Tricuspid valve regurgitation is moderate.   7. The aortic valve is tricuspid. Aortic valve regurgitation is not  visualized. No aortic stenosis is present.   8. The inferior vena cava is normal in size with greater than 50%  respiratory variability, suggesting right atrial pressure of 3 mmHg.  Past Medical History:  Diagnosis Date  Arthritis    hands   Cancer (HCC) 1991   Breast-left   Diabetes mellitus    Hyperlipidemia    Hypertension    Left breast mass     Past Surgical History:  Procedure Laterality Date   BREAST EXCISIONAL BIOPSY Left    benign   BREAST LUMPECTOMY Left 1991   BREAST SURGERY  1991   Lumpectomy   CESAREAN SECTION  1968   CYSTOSCOPY WITH URETEROSCOPY AND STENT PLACEMENT Right 10/11/2020   Procedure: CYSTOSCOPY WITH URETEROSCOPY AND STENT PLACEMENT/ RETROGRADE PYELOGRAM/ BIOPSY;  Surgeon: Heloise Purpura, MD;  Location: [facility];  Service:  Urology;  Laterality: Right;   MASS EXCISION Left 11/19/2017   Procedure: EXCISION LEFT BREAST  MASS ERAS PATHWAY;  Surgeon: Glenna Fellows, MD;  Location: [facility];  Service: General;  Laterality: Left;   ROBOT ASSITED LAPAROSCOPIC NEPHROURETERECTOMY Right 12/06/2020   Procedure: XI ROBOT ASSITED LAPAROSCOPIC NEPHROURETERECTOMY WITH POST OPERTIVE INSTILLATION OF INTRAVESICAL GEMCITABINE;  Surgeon: Heloise Purpura, MD;  Location: [facility];  Service: Urology;  Laterality: Right;    MEDICATIONS:  acetaminophen (TYLENOL) 325 MG tablet   acetaminophen (TYLENOL) 650 MG CR tablet   amLODipine (NORVASC) 5 MG tablet   atorvastatin (LIPITOR) 40 MG tablet   Blood Pressure Monitor MISC   carvedilol (COREG) 25 MG tablet   clobetasol ointment (TEMOVATE) 0.05 %   diclofenac Sodium (VOLTAREN) 1 % GEL   losartan (COZAAR) 100 MG tablet   metFORMIN (GLUCOPHAGE) 1000 MG tablet   spironolactone (ALDACTONE) 25 MG tablet   No current facility-administered medications for this encounter.   Ashley Cipro Ward, PA-C [facility] [phone]

## 2022-11-23 NOTE — Anesthesia Preprocedure Evaluation (Addendum)
Anesthesia Evaluation  Patient identified by MRN, date of birth, ID band Patient awake    Reviewed: Allergy & Precautions, NPO status , Patient's Chart, lab work & pertinent test results  Airway Mallampati: II  TM Distance: >3 FB Neck ROM: Full    Dental no notable dental hx.    Pulmonary former smoker   Pulmonary exam normal breath sounds clear to auscultation       Cardiovascular hypertension, pulmonary hypertensionNormal cardiovascular exam+ Valvular Problems/Murmurs (Mild MS, Mod TR) MR  Rhythm:Regular Rate:Normal  Echo 11/14/2022 1. Left ventricular ejection fraction, by estimation, is 65 to 70%. Left ventricular ejection fraction by 3D volume is 66 %. The left ventricle has normal function. The left ventricle has no regional wall motion abnormalities. Left ventricular diastolic parameters are indeterminate.   2. Right ventricular systolic function is normal. The right ventricular size is normal. There is mildly elevated pulmonary artery systolic pressure. The estimated right ventricular systolic pressure is 37.6 mmHg.   3. Left atrial size was mildly dilated.   4. Right atrial size was mildly dilated.   5. 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 Mild to moderate mitral valve regurgitation, improved compared to prior exam with normotension. Mild mitral stenosis. The mean mitral valve gradient is 6.0 mmHg with average heart rate of 65 bpm. Moderate to severe mitral annular calcification.   6. Tricuspid valve regurgitation is moderate.   7. The aortic valve is tricuspid. Aortic valve regurgitation is not visualized. No aortic stenosis is present.   8. The inferior vena cava is normal in size with greater than 50% respiratory variability, suggesting right atrial pressure of 3 mmHg.     Neuro/Psych negative neurological ROS     GI/Hepatic negative GI ROS, Neg liver ROS,,,  Endo/Other  diabetes     Renal/GU Renal disease     Musculoskeletal  (+) Arthritis ,    Abdominal   Peds  Hematology negative hematology ROS (+)   Anesthesia Other Findings   Reproductive/Obstetrics                             Anesthesia Physical Anesthesia Plan  ASA: 4  Anesthesia Plan: General   Post-op Pain Management: Tylenol PO (pre-op)*   Induction: Intravenous  PONV Risk Score and Plan: 4 or greater and Ondansetron, Dexamethasone and Treatment may vary due to age or medical condition  Airway Management Planned: LMA  Additional Equipment:   Intra-op Plan:   Post-operative Plan: Extubation in OR  Informed Consent: I have reviewed the patients History and Physical, chart, labs and discussed the procedure including the risks, benefits and alternatives for the proposed anesthesia with the patient or authorized representative who has indicated his/her understanding and acceptance.     Dental advisory given  Plan Discussed with: CRNA  Anesthesia Plan Comments: (See PAT note 11/21/2022)       Anesthesia Quick Evaluation

## 2022-11-29 NOTE — H&P (Signed)
@media  print    Office Visit Report 11/03/2022    Ashley Sloan. Ashley Sloan         MRN: [id number]  PRIMARY CARE:  Ashley Crow, MD    PRIMARY CARE FAX:  [phone]    REFERRING:  Ashley Sloan  DOB: 1951-01-24, 72 year old Female  PROVIDER:  Heloise Sloan, M.D.  SSN:   LOCATION:  [facility] [phone]    CC/HPI: 1. 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 She denies any recent hematuria or new voiding complaints. She was apparently diagnosed with mitral valve regurgitation after presenting with chest pain to Dr. Garnette Sloan. Echocardiogram incidentally detected mitral regurgitation despite no systolic murmur. Her ejection fraction is preserved at 65 to 70%. This appears to be an incidental diagnosis and may represent a sequela of rheumatic heart disease. She is scheduled to have a follow-up echocardiogram in the near future.     ALLERGIES: No Allergies    MEDICATIONS: Fluconazole 150 mg tablet 1 tablet PO once  Amlodipine Besylate 5 mg tablet  Atorvastatin Calcium 40 mg tablet  Carvedilol 25 mg tablet  Metformin Er Gastric 1,000 mg tablet, er gastric retention 24 hr  Metronidazole 500 mg tablet     GU PSH: Cysto Uretero Biopsy Fulgura - 2022 Cystoscopy - 08/19/2021, 04/08/2021 Cystoscopy Insert Stent - 2022 Inject For cystogram - 12/14/2020 Lap Nephro Ureterectomy - 12/06/2020 Locm 300-399Mg /Ml Iodine,1Ml - 08/12/2021 Remove Fallopian Tube     NON-GU PSH: Breast Surgery Procedure, Left         GU PMH: Renal pelvis cancer, right - 10/19/2022, - 05/02/2022, - 08/19/2021, - 08/12/2021, - 04/08/2021, - 01/05/2021, - 12/14/2020, - 2022, - 2022      PMH Notes:   1) 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 She underwent a right RAL nephroureterectomy on 12/06/20.    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  <DIV'  Tobacco Use Assessment Completed:  Used Tobacco in last 30 days?   Has never drank.  Drinks 3 caffeinated drinks per day.    REVIEW OF SYSTEMS:     GU Review Female:  Patient denies frequent urination, hard to postpone urination, burning /pain with urination, get up at night to urinate, leakage of urine, stream starts and stops, trouble starting your stream, have to strain to urinate, and currently pregnant.    Gastrointestinal (Lower):  Patient denies diarrhea and constipation.    Gastrointestinal (Upper):  Patient denies vomiting and nausea.    Constitutional:  Patient denies fever, night sweats, weight loss, and fatigue.    Skin:  Patient denies skin rash/ lesion and itching.    Eyes:  Patient denies blurred vision and double vision.    Ears/ Nose/  Throat:  Patient denies sore throat and sinus problems.    Hematologic/Lymphatic:  Patient denies swollen glands and easy bruising.    Cardiovascular:  Patient denies leg swelling and chest pains.    Respiratory:  Patient denies cough and shortness of breath.    Endocrine:  Patient denies excessive thirst.    Musculoskeletal:  Patient denies back pain and joint pain.    Neurological:  Patient denies headaches and dizziness.    Psychologic:  Patient denies depression and anxiety.     VITAL SIGNS: None     MULTI-SYSTEM PHYSICAL EXAMINATION:      Constitutional: Well-nourished. No physical deformities. Normally developed. Good grooming.            Complexity of Data:   X-Ray Review: C.T. Chest/ Abd/Pelvis: Reviewed Films.     Notes:  CT CHEST, ABDOMEN AND PELVIS WITH CONTRAST 10/19/2022   --------------------------------------------------------------------------------   Ashley Sloan  MRN: [id number] Ordering Provider: Heloise Sloan, JR  DOB: December 25, 1950 Date Collected: 10/19/2022 15:15:00  SSN: -**- Date Completed: 10/21/2022 13:13:54   --------------------------------------------------------------------------------   CLINICAL DATA: 72 year old female with history of urothelial  carcinoma of the right renal pelvis status post right  nephroureterectomy on 12/06/2020. Follow-up study. * Tracking Code:  BO *   EXAM:  CT CHEST, ABDOMEN, AND PELVIS WITH CONTRAST   TECHNIQUE:  Multidetector CT imaging of the chest, abdomen and pelvis was  performed following the standard protocol during bolus  administration of intravenous contrast.   RADIATION DOSE REDUCTION: This exam was performed according to the  departmental dose-optimization program which includes automated  exposure control, adjustment of the mA and/or kV according to  patient size and/or use of iterative reconstruction technique.   CONTRAST: 100 mL of Omnipaque 300.   COMPARISON: CT of the chest, abdomen and pelvis 08/12/2021. CTA  chest, abdomen and pelvis 01/02/2022.   FINDINGS:  CT CHEST FINDINGS   Cardiovascular: Heart size is normal. There is no significant  pericardial fluid, thickening or pericardial calcification. There is  aortic atherosclerosis, as well as atherosclerosis of the great  vessels of the mediastinum and the coronary arteries, including  calcified atherosclerotic plaque in the right coronary artery.  Severe calcifications of the mitral annulus.   Mediastinum/Nodes: No  pathologically enlarged mediastinal or hilar  lymph nodes. Small hiatal hernia. No axillary lymphadenopathy.  Surgical clips in the left axilla from prior lymph node dissection.   Lungs/Pleura: 3 mm left lower lobe pulmonary nodule (axial image 52  of series 3), stable on prior examinations dating back to at least  09/04/2020, considered definitively benign (no imaging follow-up  recommended). No other new suspicious appearing pulmonary nodules or  masses are noted. No acute consolidative airspace disease. No  pleural effusions.   Musculoskeletal: There are no aggressive appearing lytic or blastic  lesions noted in the visualized portions of the skeleton.   CT ABDOMEN PELVIS FINDINGS   Hepatobiliary: No suspicious cystic or solid hepatic lesions. No  intra or extrahepatic biliary ductal dilatation. Gallbladder is  normal in appearance.   Pancreas: No pancreatic mass. No pancreatic ductal dilatation. No  pancreatic or peripancreatic fluid collections or inflammatory  changes.   Spleen: Unremarkable.   Adrenals/Urinary Tract: Status post right radical nephro  ureterectomy. No unexpected soft tissue mass in the right  retroperitoneum to suggest locally recurrent disease. 3.4 x 2.3 cm  low-attenuation lesion in the lower pole of the left kidney is  similar to the prior study, compatible with a simple cyst (no  imaging follow-up recommended). No aggressive appearing left renal  lesions. No hydroureteronephrosis. Postcontrast delayed images  demonstrate no definite filling defect within the left renal  collecting system, along the course of the left ureter, or within  the lumen of the urinary bladder to suggest the presence of  urothelial neoplasm at this time. Urinary bladder is normal in  appearance. Bilateral adrenal glands are normal in appearance.   Stomach/Bowel: The appearance of the intra-abdominal portion of the  stomach is normal. There is no pathologic dilatation of small  bowel  or colon. Numerous colonic diverticuli are noted. No surrounding  inflammatory changes to indicate an acute diverticulitis at this  time. Normal appendix.   Vascular/Lymphatic: Atherosclerotic calcifications in the abdominal  aorta and pelvic vasculature, without evidence of aneurysm or  dissection in the abdominal or pelvic vasculature. No  lymphadenopathy noted in the abdomen or pelvis.   Reproductive: Uterus and ovaries are unremarkable in appearance.   Other: No significant volume of ascites. No pneumoperitoneum.   Musculoskeletal: There are no aggressive appearing lytic or blastic  lesions noted in the visualized portions of the skeleton.   IMPRESSION:  1. Stable examination with no findings to suggest locally recurrent  disease or definite metastatic disease in the chest, abdomen or  pelvis.  2. Colonic diverticulosis without evidence of acute diverticulitis  at this time.  3. Aortic atherosclerosis, in addition to right coronary artery  disease. Assessment for potential risk factor modification, dietary  therapy or pharmacologic therapy may be warranted, if clinically  indicated.  4. There are calcifications of the mitral annulus. Echocardiographic  correlation for evaluation of potential valvular dysfunction may be  warranted if clinically indicated.  5. Additional incidental findings, as above.    Electronically Signed  By: Trudie Reed M.D.  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 All questions were answered. Informed consent was obtained. Antibiotic prophylaxis was given. Sterile technique and intraurethral analgesia were used.  Meatus:  Normal  size. Normal location. Normal condition.  Urethra:  No hypermobility. No leakage.  Ureteral Orifices:  Absent right orifice.  Bladder:  Systematic examination of bladder was performed. The right ureteral orifice was absent. The left ureteral orifice was in its normal position and effluxing clear urine. There was a small amount of stone material within the bladder that was easily friable. A very small less than 1 cm papillary tumor was noted to the left side of the dome of the bladder. There was another less than 1 cm area of potential papillary tumor on the left lateral wall as well. A bladder washing was obtained for cytology.      Chaperone: SM The lower urinary tract was carefully examined. The procedure was well-tolerated and without complications. Antibiotic instructions were given. Instructions were given to call the office immediately for bloody urine, difficulty urinating, urinary retention, painful or frequent urination, fever, chills, nausea, vomiting or other illness. The patient stated that she understood these instructions and would comply with them.    Urinalysis  Dipstick Dipstick Cont'd  Color: Yellow Bilirubin: Neg mg/dL  Appearance: Clear Ketones: Neg mg/dL  Specific Gravity: 1.610 Blood: Neg ery/uL  pH: 6.0 Protein: Neg mg/dL  Glucose: Neg mg/dL Urobilinogen: 0.2 mg/dL  Nitrites: Neg   Leukocyte Esterase: Neg leu/uL    ASSESSMENT:     ICD-10 Details  1 GU:  Renal pelvis cancer, right - C65.1   2  Bladder tumor/neoplasm - D41.4    PLAN:   Orders  Labs Urine Cytology   Lab Notes: Washing  Schedule  Return Visit/Planned Activity: Other See Visit Notes  Note: Will call to schedule surgery.  Document  Letter(s):  Created for Patient: Clinical Summary   Notes:  1. 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   2. Bladder tumors: She does appear to have 2 small papillary recurrences within the bladder. I recommend that she  proceed with cystoscopy and transurethral resection of her bladder tumors. Considering the fact that she was recently diagnosed with mitral regurgitation and is scheduled to have an upcoming echocardiogram, we will ask for cardiac clearance/risk assessment and hopefully can proceed whether later this month or next month.   CC: Dr. Garnette Sloan  Dr. Einar Sloan   * Signed by Ashley Sloan, M.D. on 11/03/22 at 5:14 PM (EDT*    APPENDED NOTES:    Cr 1.75   * Signed by Ashley Sloan, M.D. on 11/21/22 at 3:08 PM (EDT)*

## 2022-11-30 ENCOUNTER — Ambulatory Visit (HOSPITAL_BASED_OUTPATIENT_CLINIC_OR_DEPARTMENT_OTHER): Payer: PPO | Admitting: Certified Registered"

## 2022-11-30 ENCOUNTER — Ambulatory Visit (HOSPITAL_COMMUNITY): Payer: PPO | Admitting: Physician Assistant

## 2022-11-30 ENCOUNTER — Ambulatory Visit (HOSPITAL_COMMUNITY)
Admission: RE | Admit: 2022-11-30 | Discharge: 2022-11-30 | Disposition: A | Discharge status: Home / Self Care | Payer: PPO | Source: Ambulatory Visit | Attending: Urology | Admitting: Urology

## 2022-11-30 ENCOUNTER — Encounter (HOSPITAL_COMMUNITY)
Admission: RE | Disposition: A | Discharge status: Home / Self Care | Payer: Self-pay | Source: Ambulatory Visit | Attending: Urology

## 2022-11-30 ENCOUNTER — Encounter (HOSPITAL_COMMUNITY): Payer: Self-pay | Admitting: Urology

## 2022-11-30 DIAGNOSIS — C675 Malignant neoplasm of bladder neck: Secondary | ICD-10-CM | POA: Insufficient documentation

## 2022-11-30 DIAGNOSIS — Z8551 Personal history of malignant neoplasm of bladder: Secondary | ICD-10-CM | POA: Diagnosis not present

## 2022-11-30 DIAGNOSIS — I10 Essential (primary) hypertension: Secondary | ICD-10-CM

## 2022-11-30 DIAGNOSIS — M1990 Unspecified osteoarthritis, unspecified site: Secondary | ICD-10-CM | POA: Insufficient documentation

## 2022-11-30 DIAGNOSIS — E119 Type 2 diabetes mellitus without complications: Secondary | ICD-10-CM | POA: Diagnosis not present

## 2022-11-30 DIAGNOSIS — I700 Atherosclerosis of aorta: Secondary | ICD-10-CM | POA: Insufficient documentation

## 2022-11-30 DIAGNOSIS — I081 Rheumatic disorders of both mitral and tricuspid valves: Secondary | ICD-10-CM | POA: Diagnosis not present

## 2022-11-30 DIAGNOSIS — Z87891 Personal history of nicotine dependence: Secondary | ICD-10-CM | POA: Insufficient documentation

## 2022-11-30 DIAGNOSIS — C671 Malignant neoplasm of dome of bladder: Secondary | ICD-10-CM

## 2022-11-30 DIAGNOSIS — D494 Neoplasm of unspecified behavior of bladder: Secondary | ICD-10-CM | POA: Diagnosis present

## 2022-11-30 HISTORY — PX: CYSTOSCOPY: SHX5120

## 2022-11-30 HISTORY — PX: TRANSURETHRAL RESECTION OF BLADDER TUMOR: SHX2575

## 2022-11-30 LAB — GLUCOSE, CAPILLARY
Glucose-Capillary: 139 mg/dL — ABNORMAL HIGH (ref 70–99)
Glucose-Capillary: 129 mg/dL — ABNORMAL HIGH (ref 70–99)

## 2022-11-30 SURGERY — TURBT (TRANSURETHRAL RESECTION OF BLADDER TUMOR)
Anesthesia: General

## 2022-11-30 MED ORDER — FENTANYL CITRATE PF 50 MCG/ML IJ SOSY: 25.0000 ug | PREFILLED_SYRINGE | INTRAMUSCULAR | Status: DC | PRN

## 2022-11-30 MED ORDER — PROPOFOL 10 MG/ML IV BOLUS
INTRAVENOUS | Status: DC | PRN
  Administered 2022-11-30: 180 mg via INTRAVENOUS

## 2022-11-30 MED ORDER — ACETAMINOPHEN 500 MG PO TABS
1000.0000 mg | ORAL_TABLET | Freq: Once | ORAL | Status: AC
  Administered 2022-11-30: 1000 mg via ORAL
  Filled 2022-11-30: qty 2

## 2022-11-30 MED ORDER — PROPOFOL 10 MG/ML IV BOLUS
INTRAVENOUS | Status: AC
  Filled 2022-11-30: qty 20

## 2022-11-30 MED ORDER — EPHEDRINE SULFATE-NACL 50-0.9 MG/10ML-% IV SOSY
PREFILLED_SYRINGE | INTRAVENOUS | Status: DC | PRN
  Administered 2022-11-30: 10 mg via INTRAVENOUS
  Administered 2022-11-30: 5 mg via INTRAVENOUS

## 2022-11-30 MED ORDER — CHLORHEXIDINE GLUCONATE 0.12 % MT SOLN
15.0000 mL | Freq: Once | OROMUCOSAL | Status: AC
  Administered 2022-11-30: 15 mL via OROMUCOSAL

## 2022-11-30 MED ORDER — PHENYLEPHRINE 80 MCG/ML (10ML) SYRINGE FOR IV PUSH (FOR BLOOD PRESSURE SUPPORT)
PREFILLED_SYRINGE | INTRAVENOUS | Status: AC
  Filled 2022-11-30: qty 10

## 2022-11-30 MED ORDER — PHENAZOPYRIDINE HCL 200 MG PO TABS: 200.0000 mg | ORAL_TABLET | Freq: Three times a day (TID) | ORAL | 0 refills | Status: AC | PRN

## 2022-11-30 MED ORDER — LACTATED RINGERS IV SOLN: INTRAVENOUS | Status: DC

## 2022-11-30 MED ORDER — CEFAZOLIN SODIUM-DEXTROSE 2-4 GM/100ML-% IV SOLN
2.0000 g | INTRAVENOUS | Status: AC
  Administered 2022-11-30: 2 g via INTRAVENOUS
  Filled 2022-11-30: qty 100

## 2022-11-30 MED ORDER — LIDOCAINE HCL (CARDIAC) PF 100 MG/5ML IV SOSY
PREFILLED_SYRINGE | INTRAVENOUS | Status: DC | PRN
  Administered 2022-11-30: 60 mg via INTRAVENOUS

## 2022-11-30 MED ORDER — SODIUM CHLORIDE 0.9 % IR SOLN
Status: DC | PRN
  Administered 2022-11-30: 3000 mL via INTRAVESICAL

## 2022-11-30 MED ORDER — 0.9 % SODIUM CHLORIDE (POUR BTL) OPTIME
TOPICAL | Status: DC | PRN
  Administered 2022-11-30: 1000 mL

## 2022-11-30 MED ORDER — ONDANSETRON HCL 4 MG/2ML IJ SOLN
INTRAMUSCULAR | Status: DC | PRN
  Administered 2022-11-30: 4 mg via INTRAVENOUS

## 2022-11-30 MED ORDER — FENTANYL CITRATE (PF) 100 MCG/2ML IJ SOLN
INTRAMUSCULAR | Status: AC
  Filled 2022-11-30: qty 2

## 2022-11-30 MED ORDER — DEXAMETHASONE SODIUM PHOSPHATE 10 MG/ML IJ SOLN
INTRAMUSCULAR | Status: DC | PRN
  Administered 2022-11-30: 4 mg via INTRAVENOUS

## 2022-11-30 MED ORDER — ORAL CARE MOUTH RINSE: 15.0000 mL | Freq: Once | OROMUCOSAL | Status: AC

## 2022-11-30 MED ORDER — FENTANYL CITRATE (PF) 250 MCG/5ML IJ SOLN
INTRAMUSCULAR | Status: DC | PRN
  Administered 2022-11-30 (×2): 25 ug via INTRAVENOUS

## 2022-11-30 MED ORDER — PROMETHAZINE HCL 25 MG/ML IJ SOLN: 6.2500 mg | INTRAMUSCULAR | Status: DC | PRN

## 2022-11-30 MED ORDER — EPHEDRINE 5 MG/ML INJ
INTRAVENOUS | Status: AC
  Filled 2022-11-30: qty 5

## 2022-11-30 MED ORDER — LIDOCAINE HCL (PF) 2 % IJ SOLN
INTRAMUSCULAR | Status: AC
  Filled 2022-11-30: qty 5

## 2022-11-30 MED ORDER — ONDANSETRON HCL 4 MG/2ML IJ SOLN
INTRAMUSCULAR | Status: AC
  Filled 2022-11-30: qty 2

## 2022-11-30 MED ORDER — PHENYLEPHRINE 80 MCG/ML (10ML) SYRINGE FOR IV PUSH (FOR BLOOD PRESSURE SUPPORT)
PREFILLED_SYRINGE | INTRAVENOUS | Status: DC | PRN
  Administered 2022-11-30: 80 ug via INTRAVENOUS

## 2022-11-30 MED ORDER — DEXAMETHASONE SODIUM PHOSPHATE 10 MG/ML IJ SOLN
INTRAMUSCULAR | Status: AC
  Filled 2022-11-30: qty 1

## 2022-11-30 SURGICAL SUPPLY — 21 items
BAG DRN RND TRDRP ANRFLXCHMBR (UROLOGICAL SUPPLIES)
BAG URINE DRAIN 2000ML AR STRL (UROLOGICAL SUPPLIES) IMPLANT
BAG URO CATCHER STRL LF (MISCELLANEOUS) ×2 IMPLANT
CATH URETL OPEN END 6FR 70 (CATHETERS) IMPLANT
CLOTH BEACON ORANGE TIMEOUT ST (SAFETY) ×2 IMPLANT
DRAPE FOOT SWITCH (DRAPES) ×2 IMPLANT
ELECT REM PT RETURN 15FT ADLT (MISCELLANEOUS) ×2 IMPLANT
GLOVE SURG LX STRL 7.5 STRW (GLOVE) ×2 IMPLANT
GOWN STRL REUS W/ TWL XL LVL3 (GOWN DISPOSABLE) ×2 IMPLANT
GOWN STRL REUS W/TWL XL LVL3 (GOWN DISPOSABLE) ×1
GUIDEWIRE STR DUAL SENSOR (WIRE) ×2 IMPLANT
GUIDEWIRE ZIPWRE .038 STRAIGHT (WIRE) IMPLANT
KIT TURNOVER KIT A (KITS) IMPLANT
LOOP CUT BIPOLAR 24F LRG (ELECTROSURGICAL) IMPLANT
MANIFOLD NEPTUNE II (INSTRUMENTS) ×2 IMPLANT
PACK CYSTO (CUSTOM PROCEDURE TRAY) ×2 IMPLANT
PENCIL SMOKE EVACUATOR (MISCELLANEOUS) IMPLANT
SYR TOOMEY IRRIG 70ML (MISCELLANEOUS)
SYRINGE TOOMEY IRRIG 70ML (MISCELLANEOUS) IMPLANT
TUBING CONNECTING 10 (TUBING) ×2 IMPLANT
TUBING UROLOGY SET (TUBING) ×2 IMPLANT

## 2022-11-30 NOTE — Op Note (Signed)
Preoperative diagnosis: History of urothelial carcinoma, bladder tumor  Postoperative diagnosis: History of urothelial carcinoma, bladder tumor  Procedures: 1.  Cystoscopy 2.  Transurethral resection of bladder tumor (1 cm)  Surgeon: Moody Bruins MD  Anesthesia: General  EBL: Minimal  Complications: None  Specimens: 1.  Bladder tumor at bladder dome 2.  Possible bladder tumor at right bladder neck  Disposition of specimens: Pathology  Indication: Ashley Sloan is a 72 year old female with history of high-grade urothelial carcinoma status post right nephroureterectomy.  She was undergoing surveillance cystoscopy and was noted to have a possible bladder tumor near the bladder dome and some abnormal appearing mucosa on the left lateral bladder wall during office cystoscopy.  After reviewing these findings and noting that her bladder cytology was atypical, it was recommended that she proceed with the above procedures.  The potential risks, complications, and expected recovery process was discussed in detail.  Informed consent was obtained.  Description of procedure: The patient was taken the operating room and a general anesthetic was administered.  She was given preoperative antibiotics, placed in the dorsolithotomy position, and prepped and draped in usual sterile fashion.  Next, a preoperative timeout was performed.  Cystourethroscopy was then performed with a 30 degree lens which allowed inspection of the entire bladder mucosa.  The small tumor was again located just to the left side of the bladder dome and appeared to be approximately 1 cm.  On further inspection of the left lateral bladder wall, there were no apparent abnormalities.  However, there was a frondular area of the right bladder neck that was mildly suspicious.  The left ureteral orifice was effluxing clear urine.  The right ureteral orifice was absent consistent with her surgical history.  No other abnormalities were  identified.  The cystoscope was then removed and replaced with the 26 French resectoscope.  Using bipolar loop cutting resection, the tumor near the dome was resected in its entirety and sent as a specimen.  A separate specimen was then sent after resection of the right bladder neck area as well.  Hemostasis was achieved with bipolar cautery.  The bladder was drained and reinspected.  No other abnormalities were noted and hemostasis.  Excellent.  The procedure was ended.  The patient tolerated procedure well without complications.  She was able to be awakened and transferred to recovery in satisfactory condition.

## 2022-11-30 NOTE — Transfer of Care (Signed)
Immediate Anesthesia Transfer of Care Note  Patient: Ashley Sloan  Procedure(s) Performed: TRANSURETHRAL RESECTION OF BLADDER TUMOR (TURBT) CYSTOSCOPY  Patient Location: PACU  Anesthesia Type:General  Level of Consciousness: drowsy and patient cooperative  Airway & Oxygen Therapy: Patient Spontanous Breathing and Patient connected to face mask oxygen  Post-op Assessment: Report given to RN and Post -op Vital signs reviewed and stable  Post vital signs: Reviewed and stable  Last Vitals:  Vitals Value Taken Time  BP 107/54 11/30/22 0915  Temp 35.9 C 11/30/22 0915  Pulse 53 11/30/22 0924  Resp 13 11/30/22 0924  SpO2 100 % 11/30/22 0924  Vitals shown include unvalidated device data.  Last Pain:  Vitals:   11/30/22 0915  TempSrc:   PainSc: Asleep         Complications: No notable events documented.

## 2022-11-30 NOTE — Discharge Instructions (Signed)
You may see some blood in the urine and may have some burning with urination for 48-72 hours. You also may notice that you have to urinate more frequently or urgently after your procedure which is normal.  You should call should you develop an inability urinate, fever > 101, persistent nausea and vomiting that prevents you from eating or drinking to stay hydrated.    

## 2022-11-30 NOTE — Anesthesia Procedure Notes (Signed)
Procedure Name: LMA Insertion Date/Time: 11/30/2022 8:46 AM  Performed by: Oletha Cruel, CRNAPre-anesthesia Checklist: Patient identified, Emergency Drugs available, Suction available, Patient being monitored and Timeout performed Patient Re-evaluated:Patient Re-evaluated prior to induction Oxygen Delivery Method: Circle system utilized Preoxygenation: Pre-oxygenation with 100% oxygen Induction Type: IV induction Ventilation: Mask ventilation without difficulty LMA: LMA inserted LMA Size: 4.0 Number of attempts: 1 Placement Confirmation: positive ETCO2 and breath sounds checked- equal and bilateral Tube secured with: Tape

## 2022-11-30 NOTE — Anesthesia Postprocedure Evaluation (Signed)
Anesthesia Post Note  Patient: Ashley Sloan  Procedure(s) Performed: TRANSURETHRAL RESECTION OF BLADDER TUMOR (TURBT) CYSTOSCOPY     Patient location during evaluation: PACU Anesthesia Type: General Level of consciousness: sedated and patient cooperative Pain management: pain level controlled Vital Signs Assessment: post-procedure vital signs reviewed and stable Respiratory status: spontaneous breathing Cardiovascular status: stable Anesthetic complications: no   No notable events documented.  Last Vitals:  Vitals:   11/30/22 0951 11/30/22 1009  BP: 125/61 (!) 145/63  Pulse: (!) 53 (!) 55  Resp: 12   Temp: (!) 36.4 C (!) 36.3 C  SpO2: 100% 100%    Last Pain:  Vitals:   11/30/22 0951  TempSrc:   PainSc: 0-No pain                 Lewie Loron

## 2022-11-30 NOTE — Interval H&P Note (Signed)
History and Physical Interval Note:  11/30/2022 7:01 AM  Ashley Sloan  has presented today for surgery, with the diagnosis of BLADDER TUMORS.  The various methods of treatment have been discussed with the patient and family. After consideration of risks, benefits and other options for treatment, the patient has consented to  Procedure(s) with comments: TRANSURETHRAL RESECTION OF BLADDER TUMOR (TURBT) (N/A) - 60 MINUTES NEEDED FOR CASE CYSTOSCOPY (N/A) as a surgical intervention.  The patient's history has been reviewed, patient examined, no change in status, stable for surgery.  I have reviewed the patient's chart and labs.  Questions were answered to the patient's satisfaction.     [employer]

## 2022-12-01 ENCOUNTER — Encounter (HOSPITAL_COMMUNITY): Payer: Self-pay | Admitting: Urology

## 2022-12-01 LAB — SURGICAL PATHOLOGY

## 2022-12-12 ENCOUNTER — Ambulatory Visit (INDEPENDENT_AMBULATORY_CARE_PROVIDER_SITE_OTHER): Payer: PPO | Admitting: *Deleted

## 2022-12-12 DIAGNOSIS — Z0000 Encounter for general adult medical examination without abnormal findings: Secondary | ICD-10-CM

## 2022-12-12 NOTE — Progress Notes (Cosign Needed)
Subjective:   Ashley Sloan is a 72 y.o. female who presents for Medicare Annual (Subsequent) preventive examination.  I connected with  Mayer Masker on 12/12/22 by a telephone enabled telemedicine application and verified that I am speaking with the correct person using two identifiers.   I discussed the limitations of evaluation and management by telemedicine. The patient expressed understanding and agreed to proceed.  Patient location: home  Provider location: telephone/home   Review of Systems     Cardiac Risk Factors include: advanced age (>48men, >19 women);sedentary lifestyle;hypertension;diabetes mellitus;family history of premature cardiovascular disease     Objective:    Today's Vitals   12/12/22 1602  PainSc: 5    There is no height or weight on file to calculate BMI.     12/12/2022    4:04 PM 11/21/2022    2:09 PM 07/24/2022    9:06 AM 07/11/2022    3:02 PM 01/02/2022    7:17 AM 11/30/2021   10:16 AM 11/14/2021   10:05 AM  Advanced Directives  Does Patient Have a Medical Advance Directive? No No No No No No No  Would patient like information on creating a medical advance directive? No - Patient declined     No - Patient declined No - Patient declined    Current Medications (verified) Outpatient Encounter Medications as of 12/12/2022  Medication Sig   acetaminophen (TYLENOL) 325 MG tablet Take 2 tablets (650 mg total) by mouth every 6 (six) hours as needed.   acetaminophen (TYLENOL) 650 MG CR tablet Take 1,300 mg by mouth every 8 (eight) hours as needed for pain.   amLODipine (NORVASC) 5 MG tablet Take 1 tablet (5 mg total) by mouth in the morning and at bedtime.   atorvastatin (LIPITOR) 40 MG tablet Take 1 tablet (40 mg total) by mouth daily.   Blood Pressure Monitor MISC 1 each by Does not apply route daily.   carvedilol (COREG) 25 MG tablet TAKE 1 TABLET(25 MG) BY MOUTH TWICE DAILY WITH A MEAL   clobetasol ointment (TEMOVATE) 0.05 % Apply twice daily to  irritated areas over the groin.   diclofenac Sodium (VOLTAREN) 1 % GEL Apply 2 g topically 4 (four) times daily.   losartan (COZAAR) 100 MG tablet Take 1 tablet (100 mg total) by mouth daily.   metFORMIN (GLUCOPHAGE) 1000 MG tablet TAKE 1 TABLET(1000 MG) BY MOUTH TWICE DAILY WITH A MEAL   phenazopyridine (PYRIDIUM) 200 MG tablet Take 1 tablet (200 mg total) by mouth 3 (three) times daily as needed for pain.   spironolactone (ALDACTONE) 25 MG tablet Take 1 tablet (25 mg total) by mouth daily.   No facility-administered encounter medications on file as of 12/12/2022.    Allergies (verified) Patient has no known allergies.   History: Past Medical History:  Diagnosis Date   Arthritis    hands   Cancer (HCC) 1991   Breast-left   Diabetes mellitus    Hyperlipidemia    Hypertension    Left breast mass    Past Surgical History:  Procedure Laterality Date   BREAST EXCISIONAL BIOPSY Left    benign   BREAST LUMPECTOMY Left 1991   BREAST SURGERY  1991   Lumpectomy   CESAREAN SECTION  1968   CYSTOSCOPY N/A 11/30/2022   Procedure: CYSTOSCOPY;  Surgeon: Heloise Purpura, MD;  Location: [facility];  Service: Urology;  Laterality: N/A;   CYSTOSCOPY WITH URETEROSCOPY AND STENT PLACEMENT Right 10/11/2020   Procedure: CYSTOSCOPY WITH URETEROSCOPY AND STENT  PLACEMENT/ RETROGRADE PYELOGRAM/ BIOPSY;  Surgeon: Heloise Purpura, MD;  Location: [facility];  Service: Urology;  Laterality: Right;   MASS EXCISION Left 11/19/2017   Procedure: EXCISION LEFT BREAST  MASS ERAS PATHWAY;  Surgeon: Glenna Fellows, MD;  Location: [facility];  Service: General;  Laterality: Left;   ROBOT ASSITED LAPAROSCOPIC NEPHROURETERECTOMY Right 12/06/2020   Procedure: XI ROBOT ASSITED LAPAROSCOPIC NEPHROURETERECTOMY WITH POST OPERTIVE INSTILLATION OF INTRAVESICAL GEMCITABINE;  Surgeon: Heloise Purpura, MD;  Location: [facility];  Service: Urology;  Laterality: Right;   TRANSURETHRAL RESECTION OF BLADDER TUMOR N/A 11/30/2022    Procedure: TRANSURETHRAL RESECTION OF BLADDER TUMOR (TURBT);  Surgeon: Heloise Purpura, MD;  Location: [facility];  Service: Urology;  Laterality: N/A;  60 MINUTES NEEDED FOR CASE   Family History  Problem Relation Age of Onset   Diabetes Mother    Hypertension Mother    COPD Mother    Hypertension Father    Heart disease Sister    Hypertension Brother    Diabetes Daughter    Social History   Socioeconomic History   Marital status: Widowed    Spouse name: Not on file   Number of children: 1   Years of education: 6   Highest education level: High school graduate  Occupational History   Not on file  Tobacco Use   Smoking status: Former    Packs/day: 0.50    Years: 25.00    Additional pack years: 0.00    Total pack years: 12.50    Types: Cigarettes    Start date: 07/03/1964    Quit date: 12/18/1982    Years since quitting: 40.0   Smokeless tobacco: Never  Vaping Use   Vaping Use: Never used  Substance and Sexual Activity   Alcohol use: No    Alcohol/week: 0.0 standard drinks of alcohol   Drug use: No   Sexual activity: Yes    Birth control/protection: Post-menopausal  Other Topics Concern   Not on file  Social History Narrative   Patient lives alone in [place].    Patient has one daughter, she is very close with her.    Patient is a widow ~3 years.   Patient is still working as a Programmer, applications.    Patient enjoys reading and watching TV.   Social Determinants of Health   Financial Resource Strain: Low Risk  (12/12/2022)   Overall Financial Resource Strain (CARDIA)    Difficulty of Paying Living Expenses: Not hard at all  Food Insecurity: No Food Insecurity (12/12/2022)   Hunger Vital Sign    Worried About Running Out of Food in the Last Year: Never true    Ran Out of Food in the Last Year: Never true  Transportation Needs: No Transportation Needs (12/12/2022)   PRAPARE - Administrator, Civil Service (Medical): No    Lack of Transportation (Non-Medical):  No  Physical Activity: Inactive (12/12/2022)   Exercise Vital Sign    Days of Exercise per Week: 0 days    Minutes of Exercise per Session: 0 min  Stress: No Stress Concern Present (12/12/2022)   [employer] of Occupational Health - Occupational Stress Questionnaire    Feeling of Stress : Not at all  Social Connections: Moderately Isolated (12/12/2022)   Social Connection and Isolation Panel [NHANES]    Frequency of Communication with Friends and Family: More than three times a week    Frequency of Social Gatherings with Friends and Family: Once a week    Attends  Religious Services: More than 4 times per year    Active Member of Clubs or Organizations: No    Attends Banker Meetings: Never    Marital Status: Widowed    Tobacco Counseling Counseling given: Not Answered   Clinical Intake:  Pre-visit preparation completed: Yes  Pain : 0-10 Pain Score: 5  Pain Type: Chronic pain Pain Location:  (diverticulitis) Pain Onset: Yesterday Pain Frequency: Intermittent     Nutritional Risks: None Diabetes: Yes CBG done?: No Did pt. bring in CBG monitor from home?: No  How often do you need to have someone help you when you read instructions, pamphlets, or other written materials from your doctor or pharmacy?: 1 - Never  Diabetic?  Yes  Nutrition Risk Assessment:  Has the patient had any N/V/D within the last 2 months?  No  Does the patient have any non-healing wounds?  No  Has the patient had any unintentional weight loss or weight gain?  No   Diabetes:  Is the patient diabetic?  No  If diabetic, was a CBG obtained today?  No  Did the patient bring in their glucometer from home?  No  How often do you monitor your CBG's? Does check.   Financial Strains and Diabetes Management:  Are you having any financial strains with the device, your supplies or your medication? No .  Does the patient want to be seen by Chronic Care Management for management of their  diabetes?  No  Would the patient like to be referred to a Nutritionist or for Diabetic Management?  No   Diabetic Exams:  Diabetic Eye Exam:  Overdue for diabetic eye exam. Pt has been advised about the importance in completing this exam. A referral has been placed today. Message sent to referral coordinator for scheduling purposes. Advised pt to expect a call from office referred to regarding appt.  Diabetic Foot Exam:  Pt has been advised about the importance in completing this exam. Pt is scheduled   Interpreter Needed?: No  Information entered by :: Remi Haggard LPN   Activities of Daily Living    12/12/2022    4:08 PM 11/21/2022    2:11 PM  In your present state of health, do you have any difficulty performing the following activities:  Hearing? 1   Vision? 0   Difficulty concentrating or making decisions? 0   Walking or climbing stairs? 1   Dressing or bathing? 0   Doing errands, shopping? 0 0  Preparing Food and eating ? N   Using the Toilet? N   In the past six months, have you accidently leaked urine? Y   Do you have problems with loss of bowel control? N   Managing your Medications? N   Managing your Finances? N   Housekeeping or managing your Housekeeping? N     Patient Care Team: Reece Leader, DO as PCP - General (Family Medicine) Lyn Records, MD (Inactive) as PCP - Cardiology (Cardiology)  Indicate any recent Medical Services you may have received from other than [facility] providers in the past year (date may be approximate).     Assessment:   This is a routine wellness examination for Attie.  Hearing/Vision screen Hearing Screening - Comments:: Bilateral hearing Vision Screening - Comments:: Not up to date groat  Dietary issues and exercise activities discussed: Current Exercise Habits: The patient does not participate in regular exercise at present   Goals Addressed  This Visit's Progress    Patient Stated       No goals        Depression Screen    12/12/2022    4:10 PM 07/24/2022    9:05 AM 07/11/2022    3:02 PM 11/30/2021   10:15 AM 11/14/2021   10:12 AM 07/27/2021    3:13 PM 07/05/2021    3:38 PM  PHQ 2/9 Scores  PHQ - 2 Score 0 0 0 0 0 0 0  PHQ- 9 Score 0 0 0 0 0 0 0    Fall Risk    12/12/2022    4:03 PM 07/24/2022    9:05 AM 11/30/2021   10:15 AM 11/14/2021   10:05 AM 07/05/2021    3:38 PM  Fall Risk   Falls in the past year? 0 0 0 0 0  Number falls in past yr: 0 0 0 0 0  Injury with Fall? 0 0 0 0 0  Follow up Falls evaluation completed;Education provided;Falls prevention discussed        FALL RISK PREVENTION PERTAINING TO THE HOME:  Any stairs in or around the home? No  If so, are there any without handrails? No  Home free of loose throw rugs in walkways, pet beds, electrical cords, etc? Yes  Adequate lighting in your home to reduce risk of falls? Yes   ASSISTIVE DEVICES UTILIZED TO PREVENT FALLS:  Life alert? No  Use of a cane, walker or w/c? No  Grab bars in the bathroom? No  Shower chair or bench in shower? No  Elevated toilet seat or a handicapped toilet? No   TIMED UP AND GO:  Was the test performed? No .    Cognitive Function:        12/12/2022    4:06 PM 01/21/2020   11:12 AM  6CIT Screen  What Year? 0 points 0 points  What month? 0 points 0 points  What time? 0 points 0 points  Count back from 20 0 points 0 points  Months in reverse 0 points 0 points  Repeat phrase 0 points 0 points  Total Score 0 points 0 points    Immunizations Immunization History  Administered Date(s) Administered   Fluad Quad(high Dose 65+) 05/03/2020, 03/21/2021   Influenza,inj,Quad PF,6+ Mos 03/08/2018, 03/04/2019   Moderna Sars-Covid-2 Vaccination 07/09/2019, 08/06/2019, 05/19/2020   Pfizer Covid-19 Vaccine Bivalent Booster 5y-11y 04/27/2021   Pneumococcal Conjugate-13 03/04/2019   Pneumococcal Polysaccharide-23 03/03/2014, 10/05/2017   Tdap 03/20/2018    TDAP status: Up to date  Flu  Vaccine status: Up to date  Pneumococcal vaccine status: Up to date  Covid-19 vaccine status: Information provided on how to obtain vaccines.   Qualifies for Shingles Vaccine? Yes   Zostavax completed No   Shingrix Completed?: No.    Education has been provided regarding the importance of this vaccine. Patient has been advised to call insurance company to determine out of pocket expense if they have not yet received this vaccine. Advised may also receive vaccine at local pharmacy or Health Dept. Verbalized acceptance and understanding.  Screening Tests Health Maintenance  Topic Date Due   OPHTHALMOLOGY EXAM  04/30/2021   COVID-19 Vaccine (5 - 2023-24 season) 12/28/2022 (Originally 03/03/2022)   Zoster Vaccines- Shingrix (1 of 2) 03/14/2023 (Originally 08/31/1969)   INFLUENZA VACCINE  02/01/2023   HEMOGLOBIN A1C  05/24/2023   MAMMOGRAM  05/25/2023   Diabetic kidney evaluation - Urine ACR  07/25/2023   FOOT EXAM  07/25/2023  Diabetic kidney evaluation - eGFR measurement  11/21/2023   Medicare Annual Wellness (AWV)  12/12/2023   Colonoscopy  02/09/2025   DTaP/Tdap/Td (2 - Td or Tdap) 03/20/2028   Pneumonia Vaccine 57+ Years old  Completed   DEXA SCAN  Completed   Hepatitis C Screening  Completed   HPV VACCINES  Aged Out    Health Maintenance  Health Maintenance Due  Topic Date Due   OPHTHALMOLOGY EXAM  04/30/2021    Colorectal cancer screening: Type of screening: Colonoscopy. Completed 2023. Repeat every 3 years  Mammogram status: Completed  . Repeat every year  Bone Density status: Completed 2019. Results reflect: Bone density results: NORMAL. Repeat every 3-5 years.  Lung Cancer Screening: (Low Dose CT Chest recommended if Age 4-80 years, 30 pack-year currently smoking OR have quit w/in 15years.) does not qualify.   Lung Cancer Screening Referral:   Additional Screening:  Hepatitis C Screening: does not qualify; Completed 2020  Vision Screening: Recommended annual  ophthalmology exams for early detection of glaucoma and other disorders of the eye. Is the patient up to date with their annual eye exam?  No  Who is the provider or what is the name of the office in which the patient attends annual eye exams? [facility] If pt is not established with a provider, would they like to be referred to a provider to establish care? No .   Dental Screening: Recommended annual dental exams for proper oral hygiene  Community Resource Referral / Chronic Care Management: CRR required this visit?  No   CCM required this visit?  No      Plan:     I have personally reviewed and noted the following in the patient's chart:   Medical and social history Use of alcohol, tobacco or illicit drugs  Current medications and supplements including opioid prescriptions. Patient is not currently taking opioid prescriptions. Functional ability and status Nutritional status Physical activity Advanced directives List of other physicians Hospitalizations, surgeries, and ER visits in previous 12 months Vitals Screenings to include cognitive, depression, and falls Referrals and appointments  In addition, I have reviewed and discussed with patient certain preventive protocols, quality metrics, and best practice recommendations. A written personalized care plan for preventive services as well as general preventive health recommendations were provided to patient.     Remi Haggard, LPN   1/61/0960   Nurse Notes:

## 2022-12-21 ENCOUNTER — Other Ambulatory Visit: Payer: Self-pay | Admitting: Urology

## 2022-12-27 NOTE — Patient Instructions (Signed)
SURGICAL WAITING ROOM VISITATION  Patients having surgery or a procedure may have no more than 2 support people in the waiting area - these visitors may rotate.    Children under the age of 37 must have an adult with them who is not the patient.  Due to an increase in RSV and influenza rates and associated hospitalizations, children ages 23 and under may not visit patients in [facility] hospitals.  If the patient needs to stay at the hospital during part of their recovery, the visitor guidelines for inpatient rooms apply. Pre-op nurse will coordinate an appropriate time for 1 support person to accompany patient in pre-op.  This support person may not rotate.    Please refer to the [facility] website for the visitor guidelines for Inpatients (after your surgery is over and you are in a regular room).       Your procedure is scheduled on: 01/01/23   Report to [facility] Main Entrance    Report to admitting at  10:15 AM   Call this number if you have problems the morning of surgery [phone]   Do not eat food or liquids :After Midnight.     FOLLOW BOWEL PREP AND ANY ADDITIONAL PRE OP INSTRUCTIONS YOU RECEIVED FROM YOUR SURGEON'S OFFICE!!!     Oral Hygiene is also important to reduce your risk of infection.                                    Remember - BRUSH YOUR TEETH THE MORNING OF SURGERY WITH YOUR REGULAR TOOTHPASTE  DENTURES WILL BE REMOVED PRIOR TO SURGERY PLEASE DO NOT APPLY "Poly grip" OR ADHESIVES!!!   Do NOT smoke after Midnight   Take these medicines the morning of surgery with A SIP OF WATER:  Tyleno if needed, Amlodipine, Atorvastatin, Carvedilol  DO NOT TAKE ANY ORAL DIABETIC MEDICATIONS DAY OF YOUR SURGERY  Bring CPAP mask and tubing day of surgery.                              You may not have any metal on your body including hair pins, jewelry, and body piercing             Do not wear make-up, lotions, powders, perfumes/cologne, or  deodorant  Do not wear nail polish including gel and S&S, artificial/acrylic nails, or any other type of covering on natural nails including finger and toenails. If you have artificial nails, gel coating, etc. that needs to be removed by a nail salon please have this removed prior to surgery or surgery may need to be canceled/ delayed if the surgeon/ anesthesia feels like they are unable to be safely monitored.   Do not shave  48 hours prior to surgery.               Men may shave face and neck.   Do not bring valuables to the hospital.  IS NOT             RESPONSIBLE   FOR VALUABLES.   Contacts, glasses, dentures or bridgework may not be worn into surgery.   Bring small overnight bag day of surgery.   DO NOT BRING YOUR HOME MEDICATIONS TO THE HOSPITAL. PHARMACY WILL DISPENSE MEDICATIONS LISTED ON YOUR MEDICATION LIST TO YOU DURING YOUR ADMISSION IN THE HOSPITAL!    Patients  discharged on the day of surgery will not be allowed to drive home.  Someone NEEDS to stay with you for the first 24 hours after anesthesia.   Special Instructions: Bring a copy of your healthcare power of attorney and living will documents the day of surgery if you haven't scanned them before.              Please read over the following fact sheets you were given: IF YOU HAVE QUESTIONS ABOUT YOUR PRE-OP INSTRUCTIONS PLEASE CALL [phone]   If you received a COVID test during your pre-op visit  it is requested that you wear a mask when out in public, stay away from anyone that may not be feeling well and notify your surgeon if you develop symptoms. If you test positive for Covid or have been in contact with anyone that has tested positive in the last 10 days please notify you surgeon.    How to Manage Your Diabetes Before and After Surgery  Why is it important to control my blood sugar before and after surgery? Improving blood sugar levels before and after surgery helps healing and can limit  problems. A way of improving blood sugar control is eating a healthy diet by:  Eating less sugar and carbohydrates  Increasing activity/exercise  Talking with your doctor about reaching your blood sugar goals High blood sugars (greater than 180 mg/dL) can raise your risk of infections and slow your recovery, so you will need to focus on controlling your diabetes during the weeks before surgery. Make sure that the doctor who takes care of your diabetes knows about your planned surgery including the date and location.  How do I manage my blood sugar before surgery? Check your blood sugar at least 4 times a day, starting 2 days before surgery, to make sure that the level is not too high or low. Check your blood sugar the morning of your surgery when you wake up and every 2 hours until you get to the Short Stay unit. If your blood sugar is less than 70 mg/dL, you will need to treat for low blood sugar: Do not take insulin. Treat a low blood sugar (less than 70 mg/dL) with  cup of clear juice (cranberry or apple), 4 glucose tablets, OR glucose gel. Recheck blood sugar in 15 minutes after treatment (to make sure it is greater than 70 mg/dL). If your blood sugar is not greater than 70 mg/dL on recheck, call [phone] for further instructions. Report your blood sugar to the short stay nurse when you get to Short Stay.  If you are admitted to the hospital after surgery: Your blood sugar will be checked by the staff and you will probably be given insulin after surgery (instead of oral diabetes medicines) to make sure you have good blood sugar levels. The goal for blood sugar control after surgery is 80-180 mg/dL.   WHAT DO I DO ABOUT MY DIABETES MEDICATION?  Do not take oral diabetes medicines (pills) the morning of surgery.  THE NIGHT BEFORE SURGERY, take     units of       insulin.       THE MORNING OF SURGERY, take   units of         insulin.  DO NOT TAKE THE FOLLOWING 7 DAYS PRIOR TO  SURGERY: Ozempic, Wegovy, Rybelsus (Semaglutide), Byetta (exenatide), Bydureon (exenatide ER), Victoza, Saxenda (liraglutide), or Trulicity (dulaglutide) Mounjaro (Tirzepatide) Adlyxin (Lixisenatide), Polyethylene Glycol Loxenatide.  If your CBG is greater than  220 mg/dL, you may take  of your sliding scale  (correction) dose of insulin.    For patients with insulin pumps: Contact your diabetes doctor for specific instructions before surgery. Decrease basal rates by 20% at midnight the night before your surgery. Note that if your surgery is planned to be longer than 2 hours, your insulin pump will be removed and intravenous (IV) insulin will be started and managed by the nurses and the anesthesiologist. You will be able to restart your insulin pump once you are awake and able to manage it.  Make sure to bring insulin pump supplies to the hospital with you in case the  site needs to be changed.  Patient Signature:  Date:   Nurse Signature:  Date:   Reviewed and Endorsed by [facility] Patient Education Committee, August 2015[facility] - Preparing for Surgery Before surgery, you can play an important role.  Because skin is not sterile, your skin needs to be as free of germs as possible.  You can reduce the number of germs on your skin by washing with CHG (chlorahexidine gluconate) soap before surgery.  CHG is an antiseptic cleaner which kills germs and bonds with the skin to continue killing germs even after washing. Please DO NOT use if you have an allergy to CHG or antibacterial soaps.  If your skin becomes reddened/irritated stop using the CHG and inform your nurse when you arrive at Short Stay. Do not shave (including legs and underarms) for at least 48 hours prior to the first CHG shower.  You may shave your face/neck.  Please follow these instructions carefully:  1.  Shower with CHG Soap the night before surgery and the  morning of surgery.  2.  If you choose to wash your hair, wash your  hair first as usual with your normal  shampoo.  3.  After you shampoo, rinse your hair and body thoroughly to remove the shampoo.                             4.  Use CHG as you would any other liquid soap.  You can apply chg directly to the skin and wash.  Gently with a scrungie or clean washcloth.  5.  Apply the CHG Soap to your body ONLY FROM THE NECK DOWN.   Do   not use on face/ open                           Wound or open sores. Avoid contact with eyes, ears mouth and   genitals (private parts).                       Wash face,  Genitals (private parts) with your normal soap.             6.  Wash thoroughly, paying special attention to the area where your    surgery  will be performed.  7.  Thoroughly rinse your body with warm water from the neck down.  8.  DO NOT shower/wash with your normal soap after using and rinsing off the CHG Soap.                9.  Pat yourself dry with a clean towel.            10.  Wear clean pajamas.  11.  Place clean sheets on your bed the night of your first shower and do not  sleep with pets. Day of Surgery : Do not apply any lotions/deodorants the morning of surgery.  Please wear clean clothes to the hospital/surgery center.  FAILURE TO FOLLOW THESE INSTRUCTIONS MAY RESULT IN THE CANCELLATION OF YOUR SURGERY  PATIENT SIGNATURE_________________________________  NURSE SIGNATURE__________________________________  ________________________________________________________________________

## 2022-12-27 NOTE — Progress Notes (Signed)
COVID Vaccine received:  []  No [x]  Yes Date of any COVID positive Test in last 90 days:  PCP - Wandra Feinstein DO Cardiologist - Verdis Prime MD  Chest x-ray - 01/02/22  EPIC EKG -  %/21/24  EPIC Stress Test -  ECHO - 11/14/22  EPIC Cardiac Cath -   Bowel Prep - []  No  []   Yes ______  Pacemaker / ICD device []  No []  Yes   Spinal Cord Stimulator:[]  No []  Yes       History of Sleep Apnea? []  No []  Yes   CPAP used?- []  No []  Yes    Does the patient monitor blood sugar?          []  No []  Yes  []  N/A  Patient has: []  NO Hx DM   []  Pre-DM                 []  DM1  [x]   DM2 Does patient have a [employer] or Dexacom? []  No []  Yes   Fasting Blood Sugar Ranges-  Checks Blood Sugar _____ times a day  GLP1 agonist / usual dose -  GLP1 instructions:  SGLT-2 inhibitors / usual dose -  SGLT-2 instructions:   Blood Thinner / Instructions: Aspirin Instructions:  Comments:   Activity level: Patient is able / unable to climb a flight of stairs without difficulty; []  No CP  []  No SOB, but would have ___   Patient can / can not perform ADLs without assistance.   Anesthesia review: DM2, HTN, R BBB,Mitral valve regurg.  Patient denies shortness of breath, fever, cough and chest pain at PAT appointment.  Patient verbalized understanding and agreement to the Pre-Surgical Instructions that were given to them at this PAT appointment. Patient was also educated of the need to review these PAT instructions again prior to his/her surgery.I reviewed the appropriate phone numbers to call if they have any and questions or concerns.

## 2022-12-28 NOTE — Progress Notes (Addendum)
COVID Vaccine received:  []  No [x]  Yes Date of any COVID positive Test in last 90 days:none   PCP - Wandra Feinstein DO Cardiologist - Laurance Flatten, MD  LOV 11-21-22   Chest x-ray - 01/02/22  EPIC EKG -  11/21/22  EPIC Stress Test -  ECHO - 11/14/22  EPIC Cardiac Cath -    Bowel Prep - [x]  No  []   Yes ______   Pacemaker / ICD device [x]  No []  Yes   Spinal Cord Stimulator:[x]  No []  Yes       History of Sleep Apnea? [x]  No []  Yes   CPAP used?- [x]  No []  Yes     Does the patient monitor blood sugar?          []  No []  Yes  []  N/A  Last A1c on 11-21-2022 was 6.9 Patient has: []  NO Hx DM   []  Pre-DM                 []  DM1  [x]   DM2 Does patient have a [employer] or [employer]? [x]  No []  Yes   Fasting Blood Sugar Ranges-  Checks Blood Sugar __0 times a day  Metformin 1000mg  bid;  hold DOS  Blood Thinner / Instructions:  none Aspirin Instructions:  none   Comments: patient had this same procedure last month   Activity level: Patient is able to climb a flight of stairs without difficulty; [x]  No CP  [x]  No SOB,    Patient can  perform ADLs without assistance.    Anesthesia review: DM2, HTN, RBBB, Rheumatic Mitral valve regurg. S/p right nephroureterectomy 12-06-2020 for renal cancer   Patient denies shortness of breath, fever, cough and chest pain at PAT appointment.   Patient verbalized understanding and agreement to the Pre-Surgical Instructions that were given to them at this PAT appointment. Patient was also educated of the need to review these PAT instructions again prior to her surgery.I reviewed the appropriate phone numbers to call if they have any and questions or concerns.

## 2022-12-28 NOTE — Patient Instructions (Addendum)
SURGICAL WAITING ROOM VISITATION Patients having surgery or a procedure may have no more than 2 support people in the waiting area - these visitors may rotate in the visitor waiting room.   Due to an increase in RSV and influenza rates and associated hospitalizations, children ages 90 and under may not visit patients in [facility] hospitals. If the patient needs to stay at the hospital during part of their recovery, the visitor guidelines for inpatient rooms apply.  PRE-OP VISITATION  Pre-op nurse will coordinate an appropriate time for 1 support person to accompany the patient in pre-op.  This support person may not rotate.  This visitor will be contacted when the time is appropriate for the visitor to come back in the pre-op area.  Please refer to the [facility] website for the visitor guidelines for Inpatients (after your surgery is over and you are in a regular room).  You are not required to quarantine at this time prior to your surgery. However, you must do this: Hand Hygiene often Do NOT share personal items Notify your provider if you are in close contact with someone who has COVID or you develop fever 100.4 or greater, new onset of sneezing, cough, sore throat, shortness of breath or body aches.  If you test positive for Covid or have been in contact with anyone that has tested positive in the last 10 days please notify you surgeon.    Your procedure is scheduled on:  Monday   January 01, 2023  Report to [facility] Main Entrance: [facility]sen entrance where the [employer] is available.   Report to admitting at:  10:15   AM  Call this number if you have any questions or problems the morning of surgery [phone]  DO NOT EAT OR DRINK ANYTHING AFTER MIDNIGHT THE NIGHT PRIOR TO YOUR SURGERY / PROCEDURE.   FOLLOW BOWEL PREP AND ANY ADDITIONAL PRE OP INSTRUCTIONS YOU RECEIVED FROM YOUR SURGEON'S OFFICE!!!   Oral Hygiene is also important to reduce your risk of infection.         Remember - BRUSH YOUR TEETH THE MORNING OF SURGERY WITH YOUR REGULAR TOOTHPASTE  Do NOT smoke after Midnight the night before surgery.  METFORMIN:  DAY BEFORE SURGERY:  Take your usual dose of Metfomin.   DAY OF SURGERY:  DO NOT TAKE METFORMIN  Take ONLY these medicines the morning of surgery with A SIP OF WATER: Amlodipine, Carvedilol (Coreg). You may take Tylenol if needed for pain.                     You may not have any metal on your body including hair pins, jewelry, and body piercing  Do not wear make-up, lotions, powders, perfumes or deodorant  Do not wear nail polish including gel and S&S, artificial / acrylic nails, or any other type of covering on natural nails including finger and toenails. If you have artificial nails, gel coating, etc., that needs to be removed by a nail salon, Please have this removed prior to surgery. Not doing so may mean that your surgery could be cancelled or delayed if the Surgeon or anesthesia staff feels like they are unable to monitor you safely.   Do not shave 48 hours prior to surgery to avoid nicks in your skin which may contribute to postoperative infections.   Contacts, Hearing Aids, dentures or bridgework may not be worn into surgery. DENTURES WILL BE REMOVED PRIOR TO SURGERY PLEASE DO NOT APPLY "Poly  grip" OR ADHESIVES!!!   Patients discharged on the day of surgery will not be allowed to drive home.  Someone NEEDS to stay with you for the first 24 hours after anesthesia.  Do not bring your home medications to the hospital. The Pharmacy will dispense medications listed on your medication list to you during your admission in the Hospital.  Please read over the following fact sheets you were given: IF YOU HAVE QUESTIONS ABOUT YOUR PRE-OP INSTRUCTIONS, PLEASE CALL [phone].   [place] - Preparing for Surgery Before surgery, you can play an important role.  Because skin is not sterile, your skin needs to be as free of germs as possible.   You can reduce the number of germs on your skin by washing with CHG (chlorahexidine gluconate) soap before surgery.  CHG is an antiseptic cleaner which kills germs and bonds with the skin to continue killing germs even after washing. Please DO NOT use if you have an allergy to CHG or antibacterial soaps.  If your skin becomes reddened/irritated stop using the CHG and inform your nurse when you arrive at Short Stay. Do not shave (including legs and underarms) for at least 48 hours prior to the first CHG shower.  You may shave your face/neck.  Please follow these instructions carefully:  1.  Shower with CHG Soap the night before surgery and the  morning of surgery.  2.  If you choose to wash your hair, wash your hair first as usual with your normal  shampoo.  3.  After you shampoo, rinse your hair and body thoroughly to remove the shampoo.                             4.  Use CHG as you would any other liquid soap.  You can apply chg directly to the skin and wash.  Gently with a scrungie or clean washcloth.  5.  Apply the CHG Soap to your body ONLY FROM THE NECK DOWN.   Do not use on face/ open                           Wound or open sores. Avoid contact with eyes, ears mouth and genitals (private parts).                       Wash face,  Genitals (private parts) with your normal soap.             6.  Wash thoroughly, paying special attention to the area where your  surgery  will be performed.  7.  Thoroughly rinse your body with warm water from the neck down.  8.  DO NOT shower/wash with your normal soap after using and rinsing off the CHG Soap.            9.  Pat yourself dry with a clean towel.            10.  Wear clean pajamas.            11.  Place clean sheets on your bed the night of your first shower and do not  sleep with pets.  ON THE DAY OF SURGERY : Do not apply any lotions/deodorants the morning of surgery.  Please wear clean clothes to the hospital/surgery center.    FAILURE TO  FOLLOW THESE INSTRUCTIONS MAY RESULT IN THE CANCELLATION OF YOUR  SURGERY  PATIENT SIGNATURE_________________________________  NURSE SIGNATURE__________________________________  ________________________________________________________________________

## 2022-12-29 ENCOUNTER — Other Ambulatory Visit: Payer: Self-pay

## 2022-12-29 ENCOUNTER — Encounter (HOSPITAL_COMMUNITY): Payer: Self-pay

## 2022-12-29 ENCOUNTER — Encounter (HOSPITAL_COMMUNITY)
Admission: RE | Admit: 2022-12-29 | Discharge: 2022-12-29 | Disposition: A | Discharge status: Home / Self Care | Payer: PPO | Source: Ambulatory Visit | Attending: Urology | Admitting: Urology

## 2022-12-29 DIAGNOSIS — E119 Type 2 diabetes mellitus without complications: Secondary | ICD-10-CM | POA: Diagnosis not present

## 2022-12-29 DIAGNOSIS — I10 Essential (primary) hypertension: Secondary | ICD-10-CM | POA: Diagnosis not present

## 2022-12-29 DIAGNOSIS — Z01818 Encounter for other preprocedural examination: Secondary | ICD-10-CM | POA: Diagnosis present

## 2022-12-29 HISTORY — DX: Chronic kidney disease, unspecified: N18.9

## 2022-12-29 LAB — CBC
HCT: 36 % (ref 36.0–46.0)
Hemoglobin: 11.7 g/dL — ABNORMAL LOW (ref 12.0–15.0)
MCH: 29.3 pg (ref 26.0–34.0)
MCHC: 32.5 g/dL (ref 30.0–36.0)
MCV: 90 fL (ref 80.0–100.0)
Platelets: 258 10*3/uL (ref 150–400)
RBC: 4 MIL/uL (ref 3.87–5.11)
RDW: 12.7 % (ref 11.5–15.5)
WBC: 6.3 10*3/uL (ref 4.0–10.5)
nRBC: 0 % (ref 0.0–0.2)

## 2022-12-29 LAB — BASIC METABOLIC PANEL
Anion gap: 9 (ref 5–15)
BUN: 21 mg/dL (ref 8–23)
CO2: 23 mmol/L (ref 22–32)
Calcium: 9.3 mg/dL (ref 8.9–10.3)
Chloride: 105 mmol/L (ref 98–111)
Creatinine, Ser: 1.51 mg/dL — ABNORMAL HIGH (ref 0.44–1.00)
GFR, Estimated: 36 mL/min — ABNORMAL LOW (ref >60)
Glucose, Bld: 126 mg/dL — ABNORMAL HIGH (ref 70–99)
Potassium: 4.5 mmol/L (ref 3.5–5.1)
Sodium: 137 mmol/L (ref 135–145)

## 2022-12-29 LAB — GLUCOSE, CAPILLARY: Glucose-Capillary: 118 mg/dL — ABNORMAL HIGH (ref 70–99)

## 2022-12-29 NOTE — H&P (Signed)
  Office Visit Report     12/20/2022   --------------------------------------------------------------------------------   Ashley Sloan  MRN: [id number]  DOB: 05/31/1951, 72 year old Female  SSN:    PRIMARY CARE:  Anjali Gupta, MD  PRIMARY CARE FAX:  [phone]  REFERRING:  Malory Spurr, Jr, M  PROVIDER:  Ovie Cornelio, M.D.  LOCATION:  [facility] 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 She had 1 tumor at the dome of the bladder and another at the right bladder neck. Her pathology in both areas indicated high-grade urothelial carcinoma with focal invasion of the lamina propria. Muscularis propria was not present at either site. She has recovered well and denies any hematuria. She follows up today to review her pathology.     ALLERGIES: No Allergies    MEDICATIONS: Fluconazole 150 mg tablet 1 tablet PO once  Amlodipine Besylate 5 mg tablet  Atorvastatin Calcium 40 mg tablet  Carvedilol 25 mg tablet  Metformin Er Gastric 1,000 mg tablet, er gastric retention 24 hr  Metronidazole 500 mg tablet     GU PSH: Cysto Uretero Biopsy Fulgura - 2022 Cystoscopy - 11/03/2022, 08/19/2021, 04/08/2021 Cystoscopy Insert Stent - 2022 Inject For cystogram - 2022 Lap Nephro Ureterectomy - 2022 Locm 300-399Mg/Ml Iodine,1Ml - 08/12/2021 Remove Fallopian Tube     NON-GU PSH: Breast Surgery Procedure, Left     GU PMH: Bladder tumor/neoplasm - 11/03/2022 Renal pelvis cancer, right - 11/03/2022, - 10/19/2022, - 05/02/2022, - 08/19/2021, - 08/12/2021, - 04/08/2021, - 01/05/2021, - 2022, - 2022, - 2022      PMH Notes:   1) 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 She underwent a right RAL nephroureterectomy on 12/06/20.   Diagnosis: pT1 Nx Mx, high grade urothelial carcinoma with negative surgical margins  Baseline renal function: Cr 0.72, eGFR > 60 ml/min   Apr 2022: Right ureteroscopy and biopsy - High grade urothelial carcinoma  June 2022: R RAL nephroureterectomy (post-op intravesical gemcitabine)  May 2024: Recurrent tumor at left dome and right bladder neck, TURBT - High grade T1   NON-GU PMH: Bacteriuria - 01/05/2021, - 2022 Arthritis Diabetes Type 2 Gout Hypercholesterolemia Hypertension Rheumatic mitral valve disease, unspecified    FAMILY HISTORY: 1 Daughter - Runs in Family   SOCIAL HISTORY: Marital Status: Widowed Ethnicity: Not Hispanic Or Latino; Race: Black or African American Current Smoking Status: Patient does not smoke anymore.   Tobacco Use Assessment Completed: Used Tobacco in last 30 days? Has never drank.  Drinks 3 caffeinated drinks per day.    REVIEW OF SYSTEMS:    GU Review Female:   Patient denies frequent urination, hard to postpone urination, burning /pain with urination, get up at night to urinate, leakage of urine, stream starts and stops, trouble starting your stream, have to strain to urinate, and currently pregnant.  Gastrointestinal (Upper):   Patient denies nausea and vomiting.  Gastrointestinal (Lower):   Patient denies diarrhea and constipation.  Constitutional:   Patient denies fever, night sweats, weight loss, and fatigue.  Skin:   Patient denies skin rash/ lesion and itching.  Eyes:   Patient denies blurred vision and double vision.  Ears/ Nose/ Throat:   Patient denies   sore throat and sinus problems.  Hematologic/Lymphatic:   Patient denies swollen glands and easy bruising.  Cardiovascular:   Patient denies leg swelling and chest pains.  Respiratory:   Patient denies cough and shortness of breath.  Endocrine:   Patient denies excessive thirst.   Musculoskeletal:   Patient denies back pain and joint pain.  Neurological:   Patient denies headaches and dizziness.  Psychologic:   Patient denies depression and anxiety.   VITAL SIGNS:      12/20/2022 12:35 PM  Weight 163 lb / 73.94 kg  Height 59 in / 149.86 cm  BP 156/79 mmHg  Pulse 56 /min  Temperature 97.1 F / 36.1 C  BMI 32.9 kg/m   MULTI-SYSTEM PHYSICAL EXAMINATION:    Constitutional: Well-nourished. No physical deformities. Normally developed. Good grooming.  Respiratory: No labored breathing, no use of accessory muscles.   Cardiovascular: Normal temperature, normal extremity pulses, no swelling, no varicosities.     Complexity of Data:  Records Review:   Pathology Reports, Previous Patient Records   PROCEDURES:          Urinalysis w/Scope Dipstick Dipstick Cont'd Micro  Color: Yellow Bilirubin: Neg mg/dL WBC/hpf: 0 - 5/hpf  Appearance: Slightly Cloudy Ketones: Neg mg/dL RBC/hpf: 3 - 10/hpf  Specific Gravity: 1.015 Blood: 2+ ery/uL Bacteria: Rare (0-9/hpf)  pH: 6.5 Protein: Neg mg/dL Cystals: NS (Not Seen)  Glucose: Neg mg/dL Urobilinogen: 0.2 mg/dL Casts: NS (Not Seen)    Nitrites: Neg Trichomonas: Not Present    Leukocyte Esterase: 1+ leu/uL Mucous: Not Present      Epithelial Cells: 0 - 5/hpf      Yeast: NS (Not Seen)      Sperm: Not Present    ASSESSMENT:      ICD-10 Details  1 GU:   Bladder Cancer overlapping sites - C67.8    PLAN:           Schedule Return Visit/Planned Activity: Other See Visit Notes             Note: Will call to schedule surgery.          Document Letter(s):  Created for Patient: Clinical Summary         Notes:   1. 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 This demonstrates a high-grade, T1 bladder cancer in both areas of resection but without muscularis present in the pathology specimen. I recommended that we proceed with a repeat TUR for restaging purposes  and explained the reasons behind this in order to accurately stage her tumor and proceed with appropriate therapy. We have briefly discussed therapeutic options based on the results of her subsequent procedure.   We have reviewed the potential risks and complications as well as the expected recovery process of this procedure. Informed consent has been obtained.   CC: Dr. Anjali Gupta    * Signed by Malek Skog, M.D. on 12/20/22 at 5:05 PM (EDT)*     

## 2022-12-29 NOTE — H&P (Signed)
Office Visit Report     12/20/2022   --------------------------------------------------------------------------------   Ashley Sloan  MRN: [id number]  DOB: 09/13/1950, 72 year old Female  SSN:    PRIMARY CARE:  Einar Crow, MD  PRIMARY CARE FAX:  [phone]  REFERRING:  Azucena Kuba  PROVIDER:  Heloise Purpura, M.D.  LOCATION:  [facility] 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 returns today after having undergone transurethral resection of her bladder tumor on 5/30. She had 1 tumor at the dome of the bladder and another at the right bladder neck. Her pathology in both areas indicated high-grade urothelial carcinoma with focal invasion of the lamina propria. Muscularis propria was not present at either site. She has recovered well and denies any hematuria. She follows up today to review her pathology.     ALLERGIES: No Allergies    MEDICATIONS: Fluconazole 150 mg tablet 1 tablet PO once  Amlodipine Besylate 5 mg tablet  Atorvastatin Calcium 40 mg tablet  Carvedilol 25 mg tablet  Metformin Er Gastric 1,000 mg tablet, er gastric retention 24 hr  Metronidazole 500 mg tablet     GU PSH: Cysto Uretero Biopsy Fulgura - 2022 Cystoscopy - 11/03/2022, 08/19/2021, 04/08/2021 Cystoscopy Insert Stent - 2022 Inject For cystogram - 2022 Lap Nephro Ureterectomy - 2022 Locm 300-399Mg /Ml Iodine,1Ml - 08/12/2021 Remove Fallopian Tube     NON-GU PSH: Breast Surgery Procedure, Left     GU PMH: Bladder tumor/neoplasm - 11/03/2022 Renal pelvis cancer, right - 11/03/2022, - 10/19/2022, - 05/02/2022, - 08/19/2021, - 08/12/2021, - 04/08/2021, - 01/05/2021, - 2022, - 2022, - 2022      PMH Notes:   1) 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 She underwent a right RAL nephroureterectomy on 12/06/20.   Diagnosis: pT1 Nx Mx, high grade urothelial carcinoma with negative surgical margins  Baseline renal function: Cr 0.72, eGFR > 60 ml/min   Apr 2022: Right ureteroscopy and biopsy - High grade urothelial carcinoma  June 2022: R RAL nephroureterectomy (post-op intravesical gemcitabine)  May 2024: Recurrent tumor at left dome and right bladder neck, TURBT - High grade T1   NON-GU PMH: Bacteriuria - 01/05/2021, - 2022 Arthritis Diabetes Type 2 Gout Hypercholesterolemia Hypertension Rheumatic mitral valve disease, unspecified    FAMILY HISTORY: 1 Daughter - Runs in Family   SOCIAL HISTORY: Marital Status: Widowed Ethnicity: Not Hispanic Or Latino; Race: Black or African American Current Smoking Status: Patient does not smoke anymore.   Tobacco Use Assessment Completed: Used Tobacco in last 30 days? Has never drank.  Drinks 3 caffeinated drinks per day.    REVIEW OF SYSTEMS:    GU Review Female:   Patient denies frequent urination, hard to postpone urination, burning /pain with urination, get up at night to urinate, leakage of urine, stream starts and stops, trouble starting your stream, have to strain to urinate, and currently pregnant.  Gastrointestinal (Upper):   Patient denies nausea and vomiting.  Gastrointestinal (Lower):   Patient denies diarrhea and constipation.  Constitutional:   Patient denies fever, night sweats, weight loss, and fatigue.  Skin:   Patient denies skin rash/ lesion and itching.  Eyes:   Patient denies blurred vision and double vision.  Ears/ Nose/ Throat:   Patient denies  sore throat and sinus problems.  Hematologic/Lymphatic:   Patient denies swollen glands and easy bruising.  Cardiovascular:   Patient denies leg swelling and chest pains.  Respiratory:   Patient denies cough and shortness of breath.  Endocrine:   Patient denies excessive thirst.   Musculoskeletal:   Patient denies back pain and joint pain.  Neurological:   Patient denies headaches and dizziness.  Psychologic:   Patient denies depression and anxiety.   VITAL SIGNS:      12/20/2022 12:35 PM  Weight 163 lb / 73.94 kg  Height 59 in / 149.86 cm  BP 156/79 mmHg  Pulse 56 /min  Temperature 97.1 F / 36.1 C  BMI 32.9 kg/m   MULTI-SYSTEM PHYSICAL EXAMINATION:    Constitutional: Well-nourished. No physical deformities. Normally developed. Good grooming.  Respiratory: No labored breathing, no use of accessory muscles.   Cardiovascular: Normal temperature, normal extremity pulses, no swelling, no varicosities.     Complexity of Data:  Records Review:   Pathology Reports, Previous Patient Records   PROCEDURES:          Urinalysis w/Scope Dipstick Dipstick Cont'd Micro  Color: Yellow Bilirubin: Neg mg/dL WBC/hpf: 0 - 5/hpf  Appearance: Slightly Cloudy Ketones: Neg mg/dL RBC/hpf: 3 - 09/WJX  Specific Gravity: 1.015 Blood: 2+ ery/uL Bacteria: Rare (0-9/hpf)  pH: 6.5 Protein: Neg mg/dL Cystals: NS (Not Seen)  Glucose: Neg mg/dL Urobilinogen: 0.2 mg/dL Casts: NS (Not Seen)    Nitrites: Neg Trichomonas: Not Present    Leukocyte Esterase: 1+ leu/uL Mucous: Not Present      Epithelial Cells: 0 - 5/hpf      Yeast: NS (Not Seen)      Sperm: Not Present    ASSESSMENT:      ICD-10 Details  1 GU:   Bladder Cancer overlapping sites - C67.8    PLAN:           Schedule Return Visit/Planned Activity: Other See Visit Notes             Note: Will call to schedule surgery.          Document Letter(s):  Created for Patient: Clinical Summary         Notes:   1. 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 This demonstrates a high-grade, T1 bladder cancer in both areas of resection but without muscularis present in the pathology specimen. I recommended that we proceed with a repeat TUR for restaging purposes  and explained the reasons behind this in order to accurately stage her tumor and proceed with appropriate therapy. We have briefly discussed therapeutic options based on the results of her subsequent procedure.   We have reviewed the potential risks and complications as well as the expected recovery process of this procedure. Informed consent has been obtained.   CC: Dr. Einar Crow    * Signed by Heloise Purpura, M.D. on 12/20/22 at 5:05 PM (EDT)*

## 2023-01-01 ENCOUNTER — Ambulatory Visit (HOSPITAL_COMMUNITY)
Admission: RE | Admit: 2023-01-01 | Discharge: 2023-01-01 | Disposition: A | Discharge status: Home / Self Care | Payer: PPO | Source: Ambulatory Visit | Attending: Urology | Admitting: Urology

## 2023-01-01 ENCOUNTER — Encounter (HOSPITAL_COMMUNITY)
Admission: RE | Disposition: A | Discharge status: Home / Self Care | Payer: Self-pay | Source: Ambulatory Visit | Attending: Urology

## 2023-01-01 ENCOUNTER — Encounter (HOSPITAL_COMMUNITY): Payer: Self-pay | Admitting: Urology

## 2023-01-01 ENCOUNTER — Ambulatory Visit (HOSPITAL_BASED_OUTPATIENT_CLINIC_OR_DEPARTMENT_OTHER): Payer: PPO | Admitting: Anesthesiology

## 2023-01-01 ENCOUNTER — Ambulatory Visit (HOSPITAL_COMMUNITY): Payer: PPO | Admitting: Anesthesiology

## 2023-01-01 DIAGNOSIS — Z7984 Long term (current) use of oral hypoglycemic drugs: Secondary | ICD-10-CM | POA: Diagnosis not present

## 2023-01-01 DIAGNOSIS — E785 Hyperlipidemia, unspecified: Secondary | ICD-10-CM | POA: Diagnosis not present

## 2023-01-01 DIAGNOSIS — Z6832 Body mass index (BMI) 32.0-32.9, adult: Secondary | ICD-10-CM | POA: Insufficient documentation

## 2023-01-01 DIAGNOSIS — C679 Malignant neoplasm of bladder, unspecified: Secondary | ICD-10-CM | POA: Diagnosis not present

## 2023-01-01 DIAGNOSIS — Z87891 Personal history of nicotine dependence: Secondary | ICD-10-CM | POA: Diagnosis not present

## 2023-01-01 DIAGNOSIS — E669 Obesity, unspecified: Secondary | ICD-10-CM | POA: Insufficient documentation

## 2023-01-01 DIAGNOSIS — E119 Type 2 diabetes mellitus without complications: Secondary | ICD-10-CM | POA: Insufficient documentation

## 2023-01-01 DIAGNOSIS — C678 Malignant neoplasm of overlapping sites of bladder: Secondary | ICD-10-CM | POA: Diagnosis not present

## 2023-01-01 DIAGNOSIS — N3000 Acute cystitis without hematuria: Secondary | ICD-10-CM | POA: Diagnosis not present

## 2023-01-01 DIAGNOSIS — C651 Malignant neoplasm of right renal pelvis: Secondary | ICD-10-CM | POA: Diagnosis not present

## 2023-01-01 DIAGNOSIS — I10 Essential (primary) hypertension: Secondary | ICD-10-CM | POA: Insufficient documentation

## 2023-01-01 DIAGNOSIS — C671 Malignant neoplasm of dome of bladder: Secondary | ICD-10-CM | POA: Diagnosis present

## 2023-01-01 DIAGNOSIS — Z01818 Encounter for other preprocedural examination: Secondary | ICD-10-CM

## 2023-01-01 DIAGNOSIS — N3020 Other chronic cystitis without hematuria: Secondary | ICD-10-CM | POA: Diagnosis not present

## 2023-01-01 DIAGNOSIS — Z79899 Other long term (current) drug therapy: Secondary | ICD-10-CM | POA: Insufficient documentation

## 2023-01-01 HISTORY — PX: TRANSURETHRAL RESECTION OF BLADDER TUMOR: SHX2575

## 2023-01-01 HISTORY — PX: CYSTOSCOPY: SHX5120

## 2023-01-01 LAB — GLUCOSE, CAPILLARY
Glucose-Capillary: 119 mg/dL — ABNORMAL HIGH (ref 70–99)
Glucose-Capillary: 98 mg/dL (ref 70–99)

## 2023-01-01 SURGERY — TURBT (TRANSURETHRAL RESECTION OF BLADDER TUMOR)
Anesthesia: General | Site: Bladder

## 2023-01-01 MED ORDER — SODIUM CHLORIDE 0.9 % IR SOLN
Status: DC | PRN
  Administered 2023-01-01: 3000 mL via INTRAVESICAL

## 2023-01-01 MED ORDER — PROPOFOL 10 MG/ML IV BOLUS
INTRAVENOUS | Status: DC | PRN
  Administered 2023-01-01: 120 mg via INTRAVENOUS

## 2023-01-01 MED ORDER — ONDANSETRON HCL 4 MG/2ML IJ SOLN
INTRAMUSCULAR | Status: AC
  Filled 2023-01-01: qty 2

## 2023-01-01 MED ORDER — LIDOCAINE HCL (PF) 2 % IJ SOLN
INTRAMUSCULAR | Status: AC
  Filled 2023-01-01: qty 5

## 2023-01-01 MED ORDER — OXYCODONE HCL 5 MG/5ML PO SOLN: 5.0000 mg | Freq: Once | ORAL | Status: AC | PRN

## 2023-01-01 MED ORDER — FENTANYL CITRATE (PF) 100 MCG/2ML IJ SOLN
INTRAMUSCULAR | Status: DC | PRN
  Administered 2023-01-01: 50 ug via INTRAVENOUS

## 2023-01-01 MED ORDER — ORAL CARE MOUTH RINSE: 15.0000 mL | Freq: Once | OROMUCOSAL | Status: AC

## 2023-01-01 MED ORDER — 0.9 % SODIUM CHLORIDE (POUR BTL) OPTIME
TOPICAL | Status: DC | PRN
  Administered 2023-01-01: 1000 mL

## 2023-01-01 MED ORDER — LACTATED RINGERS IV SOLN: INTRAVENOUS | Status: DC

## 2023-01-01 MED ORDER — INSULIN ASPART 100 UNIT/ML IJ SOLN: 0.0000 [IU] | INTRAMUSCULAR | Status: DC | PRN

## 2023-01-01 MED ORDER — EPHEDRINE 5 MG/ML INJ
INTRAVENOUS | Status: AC
  Filled 2023-01-01: qty 5

## 2023-01-01 MED ORDER — CHLORHEXIDINE GLUCONATE 0.12 % MT SOLN
15.0000 mL | Freq: Once | OROMUCOSAL | Status: AC
  Administered 2023-01-01: 15 mL via OROMUCOSAL

## 2023-01-01 MED ORDER — OXYCODONE HCL 5 MG PO TABS
ORAL_TABLET | ORAL | Status: AC
  Administered 2023-01-01: 5 mg via ORAL
  Filled 2023-01-01: qty 1

## 2023-01-01 MED ORDER — ACETAMINOPHEN 500 MG PO TABS
1000.0000 mg | ORAL_TABLET | Freq: Once | ORAL | Status: DC
Start: 2023-01-01 — End: 2023-01-01

## 2023-01-01 MED ORDER — LIDOCAINE 2% (20 MG/ML) 5 ML SYRINGE
INTRAMUSCULAR | Status: DC | PRN
  Administered 2023-01-01: 60 mg via INTRAVENOUS

## 2023-01-01 MED ORDER — DEXAMETHASONE SODIUM PHOSPHATE 10 MG/ML IJ SOLN
INTRAMUSCULAR | Status: DC | PRN
  Administered 2023-01-01: 5 mg via INTRAVENOUS

## 2023-01-01 MED ORDER — HYDROMORPHONE HCL 1 MG/ML IJ SOLN
0.2500 mg | INTRAMUSCULAR | Status: DC | PRN
  Administered 2023-01-01: 0.25 mg via INTRAVENOUS

## 2023-01-01 MED ORDER — ONDANSETRON HCL 4 MG/2ML IJ SOLN
INTRAMUSCULAR | Status: DC | PRN
  Administered 2023-01-01: 4 mg via INTRAVENOUS

## 2023-01-01 MED ORDER — CEFAZOLIN SODIUM-DEXTROSE 2-4 GM/100ML-% IV SOLN
2.0000 g | INTRAVENOUS | Status: AC
  Administered 2023-01-01: 2 g via INTRAVENOUS
  Filled 2023-01-01: qty 100

## 2023-01-01 MED ORDER — EPHEDRINE SULFATE-NACL 50-0.9 MG/10ML-% IV SOSY
PREFILLED_SYRINGE | INTRAVENOUS | Status: DC | PRN
  Administered 2023-01-01: 5 mg via INTRAVENOUS

## 2023-01-01 MED ORDER — OXYCODONE HCL 5 MG PO TABS: 5.0000 mg | ORAL_TABLET | Freq: Once | ORAL | Status: AC | PRN

## 2023-01-01 MED ORDER — HYDROMORPHONE HCL 1 MG/ML IJ SOLN
INTRAMUSCULAR | Status: AC
  Administered 2023-01-01: 0.25 mg via INTRAVENOUS
  Filled 2023-01-01: qty 1

## 2023-01-01 MED ORDER — SUGAMMADEX SODIUM 200 MG/2ML IV SOLN
INTRAVENOUS | Status: DC | PRN
  Administered 2023-01-01: 200 mg via INTRAVENOUS

## 2023-01-01 MED ORDER — ACETAMINOPHEN 500 MG PO TABS
1000.0000 mg | ORAL_TABLET | Freq: Once | ORAL | Status: AC
  Administered 2023-01-01: 1000 mg via ORAL
  Filled 2023-01-01: qty 2

## 2023-01-01 MED ORDER — ONDANSETRON HCL 4 MG/2ML IJ SOLN
4.0000 mg | Freq: Once | INTRAMUSCULAR | Status: AC | PRN
  Administered 2023-01-01: 4 mg via INTRAVENOUS

## 2023-01-01 MED ORDER — PROPOFOL 10 MG/ML IV BOLUS
INTRAVENOUS | Status: AC
  Filled 2023-01-01: qty 20

## 2023-01-01 MED ORDER — ROCURONIUM BROMIDE 10 MG/ML (PF) SYRINGE
PREFILLED_SYRINGE | INTRAVENOUS | Status: DC | PRN
  Administered 2023-01-01: 50 mg via INTRAVENOUS

## 2023-01-01 MED ORDER — FENTANYL CITRATE (PF) 100 MCG/2ML IJ SOLN
INTRAMUSCULAR | Status: AC
  Filled 2023-01-01: qty 2

## 2023-01-01 SURGICAL SUPPLY — 21 items
BAG DRN RND TRDRP ANRFLXCHMBR (UROLOGICAL SUPPLIES)
BAG URINE DRAIN 2000ML AR STRL (UROLOGICAL SUPPLIES) IMPLANT
BAG URO CATCHER STRL LF (MISCELLANEOUS) ×2 IMPLANT
CATH URETL OPEN END 6FR 70 (CATHETERS) IMPLANT
CLOTH BEACON ORANGE TIMEOUT ST (SAFETY) ×2 IMPLANT
DRAPE FOOT SWITCH (DRAPES) ×2 IMPLANT
ELECT REM PT RETURN 15FT ADLT (MISCELLANEOUS) ×2 IMPLANT
GLOVE SURG LX STRL 7.5 STRW (GLOVE) ×2 IMPLANT
GOWN STRL REUS W/ TWL XL LVL3 (GOWN DISPOSABLE) ×2 IMPLANT
GOWN STRL REUS W/TWL XL LVL3 (GOWN DISPOSABLE) ×1
GUIDEWIRE STR DUAL SENSOR (WIRE) ×2 IMPLANT
GUIDEWIRE ZIPWRE .038 STRAIGHT (WIRE) IMPLANT
KIT TURNOVER KIT A (KITS) IMPLANT
LOOP CUT BIPOLAR 24F LRG (ELECTROSURGICAL) IMPLANT
MANIFOLD NEPTUNE II (INSTRUMENTS) ×2 IMPLANT
PACK CYSTO (CUSTOM PROCEDURE TRAY) ×2 IMPLANT
PENCIL SMOKE EVACUATOR (MISCELLANEOUS) IMPLANT
SYR TOOMEY IRRIG 70ML (MISCELLANEOUS)
SYRINGE TOOMEY IRRIG 70ML (MISCELLANEOUS) IMPLANT
TUBING CONNECTING 10 (TUBING) ×2 IMPLANT
TUBING UROLOGY SET (TUBING) ×2 IMPLANT

## 2023-01-01 NOTE — Anesthesia Postprocedure Evaluation (Signed)
Anesthesia Post Note  Patient: Ashley Sloan  Procedure(s) Performed: TRANSURETHRAL RESECTION OF BLADDER TUMOR (TURBT) (Bladder) CYSTOSCOPY (Bladder)     Patient location during evaluation: PACU Anesthesia Type: General Level of consciousness: awake and alert, oriented and patient cooperative Pain management: pain level controlled Vital Signs Assessment: post-procedure vital signs reviewed and stable Respiratory status: spontaneous breathing, nonlabored ventilation and respiratory function stable Cardiovascular status: blood pressure returned to baseline and stable Postop Assessment: no apparent nausea or vomiting Anesthetic complications: no   No notable events documented.  Last Vitals:  Vitals:   01/01/23 1245 01/01/23 1300  BP: 136/63 (!) 122/57  Pulse: 63 63  Resp: 16 14  Temp: 36.6 C   SpO2: 100% 100%    Last Pain:  Vitals:   01/01/23 1310  TempSrc:   PainSc: 5                  Lannie Fields

## 2023-01-01 NOTE — Interval H&P Note (Signed)
History and Physical Interval Note:  01/01/2023 10:53 AM  Ashley Sloan  has presented today for surgery, with the diagnosis of BLADDER CANCER.  The various methods of treatment have been discussed with the patient and family. After consideration of risks, benefits and other options for treatment, the patient has consented to  Procedure(s) with comments: TRANSURETHRAL RESECTION OF BLADDER TUMOR (TURBT) (N/A) - 45 MINUTES NEEDED FOR CASE  ANESTHESIA IS GENERAL WITH PARALYSIS CYSTOSCOPY (N/A) as a surgical intervention.  The patient's history has been reviewed, patient examined, no change in status, stable for surgery.  I have reviewed the patient's chart and labs.  Questions were answered to the patient's satisfaction.     [employer]

## 2023-01-01 NOTE — Discharge Instructions (Addendum)
You may see some blood in the urine and may have some burning with urination for 48-72 hours. You also may notice that you have to urinate more frequently or urgently after your procedure which is normal.  You should call should you develop an inability urinate, fever > 101, persistent nausea and vomiting that prevents you from eating or drinking to stay hydrated.    

## 2023-01-01 NOTE — Anesthesia Procedure Notes (Signed)
Procedure Name: Intubation Date/Time: 01/01/2023 12:18 PM  Performed by: Orest Dikes, CRNAPre-anesthesia Checklist: Patient identified, Emergency Drugs available, Suction available and Patient being monitored Patient Re-evaluated:Patient Re-evaluated prior to induction Oxygen Delivery Method: Circle system utilized Preoxygenation: Pre-oxygenation with 100% oxygen Induction Type: IV induction Ventilation: Mask ventilation without difficulty Laryngoscope Size: Mac and 4 Grade View: Grade I Tube type: Oral Tube size: 7.5 mm Number of attempts: 1 Airway Equipment and Method: Stylet and Oral airway Placement Confirmation: ETT inserted through vocal cords under direct vision, positive ETCO2 and breath sounds checked- equal and bilateral Secured at: 21 cm Tube secured with: Tape Dental Injury: Teeth and Oropharynx as per pre-operative assessment  Comments: Intubation by EMT

## 2023-01-01 NOTE — Transfer of Care (Signed)
Immediate Anesthesia Transfer of Care Note  Patient: Ashley Sloan  Procedure(s) Performed: TRANSURETHRAL RESECTION OF BLADDER TUMOR (TURBT) (Bladder) CYSTOSCOPY (Bladder)  Patient Location: PACU  Anesthesia Type:General  Level of Consciousness: awake, alert , and oriented  Airway & Oxygen Therapy: Patient Spontanous Breathing and Patient connected to face mask oxygen  Post-op Assessment: Report given to RN and Post -op Vital signs reviewed and stable  Post vital signs: Reviewed and stable  Last Vitals:  Vitals Value Taken Time  BP 136/63 01/01/23 1245  Temp    Pulse 64 01/01/23 1246  Resp 9 01/01/23 1246  SpO2 100 % 01/01/23 1246  Vitals shown include unvalidated device data.  Last Pain:  Vitals:   01/01/23 1037  TempSrc:   PainSc: 0-No pain         Complications: No notable events documented.

## 2023-01-01 NOTE — Anesthesia Preprocedure Evaluation (Addendum)
Anesthesia Evaluation  Patient identified by MRN, date of birth, ID band Patient awake    Reviewed: Allergy & Precautions, NPO status , Patient's Chart, lab work & pertinent test results, reviewed documented beta blocker date and time   Airway Mallampati: III  TM Distance: >3 FB Neck ROM: Full    Dental  (+) Edentulous Upper   Pulmonary former smoker   Pulmonary exam normal breath sounds clear to auscultation       Cardiovascular hypertension (144/68 preop), Pt. on medications and Pt. on home beta blockers Normal cardiovascular exam+ Valvular Problems/Murmurs (mild-mod MR) MR  Rhythm:Regular Rate:Normal  Echo 11/2022     ECHOCARDIOGRAM REPORT       Patient Name:   Ashley Sloan Date of Exam: 11/14/2022 Medical Rec #:  [id number]      Height:       60.0 in Accession #:    [id number]     Weight:       162.6 lb Date of Birth:  Nov 10, 1950      BSA:          1.709 m Patient Age:    72 years       BP:           132/61 mmHg Patient Gender: F              HR:           60 bpm. Exam Location:  [address]  Procedure: 2D Echo, 3D Echo, Cardiac Doppler and Color Doppler  Indications:    I05.9 Rheumatic Heart Disease (Mitral Valve Insufficiency)   History:        Patient has prior history of Echocardiogram examinations, most                 recent 02/06/2022. Mitral Valve Disease, Signs/Symptoms:Edema;                 Risk Factors:Former Smoker, Hypertension, Diabetes, Dyslipidemia                 and Family History of Coronary Artery Disease. Left Breast                 Cancer (1991, status post Lumpectomy with Chemotherapy and                 Radiation), Renal Carcinoma Status post Nephrectomy with                 Chemotherapy and Radiation (2022).   Sonographer:    Farrel Conners RDCS Referring Phys: Verdis Prime, W  IMPRESSIONS    1. Left ventricular ejection fraction, by estimation, is 65 to 70%. Left ventricular  ejection fraction by 3D volume is 66 %. The left ventricle has normal function. The left ventricle has no regional wall motion abnormalities. Left ventricular diastolic  parameters are indeterminate.  2. Right ventricular systolic function is normal. The right ventricular size is normal. There is mildly elevated pulmonary artery systolic pressure. The estimated right ventricular systolic pressure is 37.6 mmHg.  3. Left atrial size was mildly dilated.  4. Right atrial size was mildly dilated.  5. 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 Mild to moderate mitral valve regurgitation, improved compared to prior exam with normotension. Mild mitral stenosis. The mean mitral valve gradient is 6.0 mmHg with average heart rate of 65 bpm. Moderate to severe mitral annular calcification.  6. Tricuspid valve regurgitation is moderate.  7. The aortic  valve is tricuspid. Aortic valve regurgitation is not visualized. No aortic stenosis is present.  8. The inferior vena cava is normal in size with greater than 50% respiratory variability, suggesting right atrial pressure of 3 mmHg.     Neuro/Psych negative neurological ROS  negative psych ROS   GI/Hepatic negative GI ROS, Neg liver ROS,,,  Endo/Other  diabetes, Well Controlled, Type 2, Oral Hypoglycemic Agents  Obesity BMI 32 FS 119 preop  Renal/GU Renal InsufficiencyRenal diseaseCr 1.51  negative genitourinary   Musculoskeletal  (+) Arthritis , Osteoarthritis,    Abdominal  (+) + obese  Peds  Hematology  (+) Blood dyscrasia, anemia Hb 11.7, plt 258   Anesthesia Other Findings   Reproductive/Obstetrics negative OB ROS                             Anesthesia Physical Anesthesia Plan  ASA: 3  Anesthesia Plan: General   Post-op Pain Management: Tylenol PO (pre-op)*   Induction: Intravenous  PONV Risk Score and Plan: 4 or greater and Ondansetron, Dexamethasone and  Treatment may vary due to age or medical condition  Airway Management Planned: Oral ETT  Additional Equipment: None  Intra-op Plan:   Post-operative Plan: Extubation in OR  Informed Consent: I have reviewed the patients History and Physical, chart, labs and discussed the procedure including the risks, benefits and alternatives for the proposed anesthesia with the patient or authorized representative who has indicated his/her understanding and acceptance.     Dental advisory given  Plan Discussed with: CRNA  Anesthesia Plan Comments:        Anesthesia Quick Evaluation

## 2023-01-01 NOTE — Op Note (Signed)
Preoperative diagnosis: High-grade, T1 urothelial carcinoma of the bladder  Postoperative diagnosis: High-grade, T1 urothelial carcinoma of the bladder  Procedures: 1.  Cystoscopy 2.  Transurethral resection of bladder tumor (1 cm)  Surgeon: Moody Bruins MD  Anesthesia: General  Complications: None  EBL: Minimal  Specimens: 1.  Right bladder neck 2.  Bladder dome  Disposition of specimens: Pathology  Indication: Ashley Sloan is a 72 year old female who recently was found to have bladder tumors at the right bladder neck and the dome of the bladder.  She underwent initial transurethral resection which demonstrated high-grade, T1 urothelial carcinoma.  She presents today for her restaging transurethral resection.  The potential risks, complications, and expected recovery process was discussed in detail.  Informed consent was obtained.  Description of procedure: The patient was taken the operating room and a general anesthetic was administered.  She was given preoperative antibiotics, placed in the dorsolithotomy position, and prepped and draped in usual sterile fashion.  Next, a preoperative timeout was performed.  It was ensured that the patient was adequately paralyzed.  Cystourethroscopy revealed healing sites of the right bladder neck and the dome of the bladder.  The right ureteral orifice was surgically absent.  The left ureteral orifice was in its expected anatomic location and was effluxing clear urine.  The cystoscope was then replaced with a 26 French resectoscope.  Using bipolar loop resection, the right bladder neck tumor site was resected and the specimens were sent for permanent pathologic analysis.  Hemostasis was achieved with bipolar cautery.  Next, the bladder dome site was reresected and this tissue was also sent for permanent pathologic analysis.  Hemostasis was achieved in a similar fashion.  The bladder was emptied and reinspected and hemostasis appeared excellent.   The patient tolerated the procedure well without complications.  She was able to be extubated and transferred to the recovery unit in satisfactory condition.

## 2023-01-02 ENCOUNTER — Encounter (HOSPITAL_COMMUNITY): Payer: Self-pay | Admitting: Urology

## 2023-01-02 LAB — SURGICAL PATHOLOGY

## 2023-03-10 ENCOUNTER — Other Ambulatory Visit: Payer: Self-pay | Admitting: Student

## 2023-03-10 DIAGNOSIS — E785 Hyperlipidemia, unspecified: Secondary | ICD-10-CM

## 2023-06-18 ENCOUNTER — Other Ambulatory Visit: Payer: Self-pay

## 2023-06-18 MED ORDER — LOSARTAN POTASSIUM 100 MG PO TABS: 100.0000 mg | ORAL_TABLET | Freq: Every day | ORAL | 1 refills | Status: DC

## 2023-09-06 ENCOUNTER — Other Ambulatory Visit: Payer: Self-pay | Admitting: Student

## 2023-09-06 DIAGNOSIS — I10 Essential (primary) hypertension: Secondary | ICD-10-CM

## 2023-10-15 ENCOUNTER — Ambulatory Visit (INDEPENDENT_AMBULATORY_CARE_PROVIDER_SITE_OTHER): Admitting: Family Medicine

## 2023-10-15 ENCOUNTER — Encounter: Payer: Self-pay | Admitting: Family Medicine

## 2023-10-15 VITALS — BP 124/68 | HR 54 | Ht 59.0 in | Wt 169.8 lb

## 2023-10-15 DIAGNOSIS — E11618 Type 2 diabetes mellitus with other diabetic arthropathy: Secondary | ICD-10-CM | POA: Diagnosis not present

## 2023-10-15 DIAGNOSIS — I10 Essential (primary) hypertension: Secondary | ICD-10-CM | POA: Diagnosis not present

## 2023-10-15 DIAGNOSIS — Z7984 Long term (current) use of oral hypoglycemic drugs: Secondary | ICD-10-CM

## 2023-10-15 DIAGNOSIS — D649 Anemia, unspecified: Secondary | ICD-10-CM

## 2023-10-15 DIAGNOSIS — E119 Type 2 diabetes mellitus without complications: Secondary | ICD-10-CM | POA: Diagnosis not present

## 2023-10-15 LAB — POCT GLYCOSYLATED HEMOGLOBIN (HGB A1C): HbA1c, POC (prediabetic range): 6.3 % (ref 5.7–6.4)

## 2023-10-15 NOTE — Patient Instructions (Addendum)
 Your diabetes is controlled.  Please continue taking your metformin as prescribed.  Your blood pressure was slightly elevated today.  Please check your blood pressure couple times a week at home in the mornings and keep a blood pressure log to bring to your next appointment. Call your eye doctor and schedule a diabetic eye exam.  Call your podiatrist to schedule an appointment with them as well.  Follow-up in 3 months Let me know before you run out of your medicines if you need refills

## 2023-10-15 NOTE — Progress Notes (Signed)
    SUBJECTIVE:   CHIEF COMPLAINT / HPI:   T2DM - checking BGL: no - Meds: Metformin 1000 mg BID, hasn't taken it since Saturday because she occasionally forgets and sometimes just does not want to take it - Eye exam: will schedule with Dr. Candi Chafe - Foot exam: Will complete today  HTN: - checking BP: hasn't checked it lately - Compliance: good - Meds: Amlodipine 5 mg twice daily, Coreg 25 mg twice daily, spironolactone 25 mg daily, losartan 100 mg daily - Sx: - Echo scheduled for 11/13/2023, follows with cardiology  Sees urologist in May  PERTINENT  PMH / PSH: HTN, T2DM, urothelial carcinoma of R kidney s/p transurethral resection of bladder tumor 01/2023 by urology, R nephrectomy 2022, mild to moderate mitral regurgitation, hyperlipidemia  OBJECTIVE:   BP 124/68   Pulse (!) 54   Ht 4\' 11"  (1.499 m)   Wt 169 lb 12.8 oz (77 kg)   SpO2 100%   BMI 34.30 kg/m   GEN: Well-appearing, no acute distress CV: Regular rate and rhythm, no murmurs Respiratory: CTAB, breathing comfortably on room air Extremities: No leg swelling bilaterally.  No skin breakdown or wounds noted bilateral feet with sensation intact.  DP and PT pulses intact bilaterally  ASSESSMENT/PLAN:   Assessment & Plan Diabetes mellitus without complication (HCC) Stable, A1c 6.3 today, decreased from 6.92 months ago - Continue metformin 1000 mg twice daily, educated on importance of compliance - Patient states she will call Dr. Candi Chafe to schedule diabetic eye exam Hypertension, unspecified type Stable, continue above medication regimen - Has echo scheduled for next month and follows with cardiology   Follow-up in 3 months  Ashley Cumins, DO [facility]

## 2023-10-16 LAB — BASIC METABOLIC PANEL WITH GFR
BUN/Creatinine Ratio: 15 (ref 12–28)
BUN: 33 mg/dL — ABNORMAL HIGH (ref 8–27)
CO2: 18 mmol/L — ABNORMAL LOW (ref 20–29)
Calcium: 8.8 mg/dL (ref 8.7–10.3)
Chloride: 105 mmol/L (ref 96–106)
Creatinine, Ser: 2.16 mg/dL — ABNORMAL HIGH (ref 0.57–1.00)
Glucose: 86 mg/dL (ref 70–99)
Potassium: 4.8 mmol/L (ref 3.5–5.2)
Sodium: 141 mmol/L (ref 134–144)
eGFR: 23 mL/min/{1.73_m2} — ABNORMAL LOW (ref >59)

## 2023-10-16 LAB — CBC
Hematocrit: 30.3 % — ABNORMAL LOW (ref 34.0–46.6)
Hemoglobin: 10 g/dL — ABNORMAL LOW (ref 11.1–15.9)
MCH: 29.5 pg (ref 26.6–33.0)
MCHC: 33 g/dL (ref 31.5–35.7)
MCV: 89 fL (ref 79–97)
Platelets: 197 10*3/uL (ref 150–450)
RBC: 3.39 x10E6/uL — ABNORMAL LOW (ref 3.77–5.28)
RDW: 13.5 % (ref 11.7–15.4)
WBC: 6.4 10*3/uL (ref 3.4–10.8)

## 2023-10-16 LAB — MICROALBUMIN / CREATININE URINE RATIO
Creatinine, Urine: 249.1 mg/dL
Microalb/Creat Ratio: 23 mg/g{creat} (ref 0–29)
Microalbumin, Urine: 56.4 ug/mL

## 2023-10-17 ENCOUNTER — Telehealth: Payer: Self-pay | Admitting: Family Medicine

## 2023-10-17 NOTE — Telephone Encounter (Signed)
 Attempted to call pt with no answer. Left VM to call back. Need to encourage PO intake and have her come back Friday or Monday for repeat BMP, may need to adjust her BP regimen and ensure her creatinine is not continuing to rise. Would likely benefit from office visit. Labs otherwise nonconcerning.

## 2023-11-04 ENCOUNTER — Other Ambulatory Visit: Payer: Self-pay | Admitting: Family Medicine

## 2023-11-04 DIAGNOSIS — E785 Hyperlipidemia, unspecified: Secondary | ICD-10-CM

## 2023-11-13 ENCOUNTER — Ambulatory Visit (HOSPITAL_COMMUNITY): Payer: PPO | Attending: Cardiovascular Disease

## 2023-11-13 DIAGNOSIS — I059 Rheumatic mitral valve disease, unspecified: Secondary | ICD-10-CM | POA: Diagnosis present

## 2023-11-13 DIAGNOSIS — I340 Nonrheumatic mitral (valve) insufficiency: Secondary | ICD-10-CM | POA: Diagnosis present

## 2023-11-13 LAB — ECHOCARDIOGRAM COMPLETE
Area-P 1/2: 2.97 cm2
MV M vel: 5.99 m/s
MV Peak grad: 143.4 mmHg
MV VTI: 0.65 cm2
S' Lateral: 2.5 cm

## 2023-11-14 ENCOUNTER — Ambulatory Visit: Payer: Self-pay | Admitting: Cardiology

## 2023-12-28 NOTE — Progress Notes (Unsigned)
 Cardiology Office Note:    Date:  01/03/2024   ID:  Ashley Sloan, DOB [birth year], MRN [id number]  PCP:  Stoney Blizzard, DO   [facility] Providers Cardiologist:  Ashley LELON Sharps DOUGLAS, MD (Inactive)     Referring MD: Stoney Blizzard, DO   Chief Complaint  Patient presents with   Hypertension   MIRAL INSUFFICIENCY\    History of Present Illness:    Ashley Sloan is a 73 y.o. female seen to establish cardiac care. Former patient of Dr Ashley Sharps. She has a with history of DMII, HTN, HLD, moderate MR, and mild MS.    Patient noted to have significant MR on TTE in 01/2022 in the setting of poorly controlled HTN. Repeat TTE 11/2022 showed mild to moderate MR after improvement of the SBP.   She had repeat Echo in May 2025 with normal EF. Mild MS and moderate MR.  On follow up today she is doing well. Denies any dyspnea, chest pain, palpitations. BP at PCP visit was normal. Getting BCG treatments for bladder CA.   Past Medical History:  Diagnosis Date   Arthritis    hands   Cancer (HCC) 1991   Breast-left   Chronic kidney disease    Diabetes mellitus    Hyperlipidemia    Hypertension    Left breast mass     Past Surgical History:  Procedure Laterality Date   BREAST EXCISIONAL BIOPSY Left    benign   BREAST LUMPECTOMY Left 1991   BREAST SURGERY  1991   Lumpectomy   CESAREAN SECTION  1968   CYSTOSCOPY N/A 11/30/2022   Procedure: CYSTOSCOPY;  Surgeon: Renda Glance, MD;  Location: [facility];  Service: Urology;  Laterality: N/A;   CYSTOSCOPY N/A 01/01/2023   Procedure: CYSTOSCOPY;  Surgeon: Renda Glance, MD;  Location: [facility];  Service: Urology;  Laterality: N/A;   CYSTOSCOPY WITH URETEROSCOPY AND STENT PLACEMENT Right 10/11/2020   Procedure: CYSTOSCOPY WITH URETEROSCOPY AND STENT PLACEMENT/ RETROGRADE PYELOGRAM/ BIOPSY;  Surgeon: Renda Glance, MD;  Location: [facility];  Service: Urology;  Laterality: Right;   MASS EXCISION Left 11/19/2017   Procedure: EXCISION  LEFT BREAST  MASS ERAS PATHWAY;  Surgeon: Mikell Katz, MD;  Location: [facility];  Service: General;  Laterality: Left;   ROBOT ASSITED LAPAROSCOPIC NEPHROURETERECTOMY Right 12/06/2020   Procedure: XI ROBOT ASSITED LAPAROSCOPIC NEPHROURETERECTOMY WITH POST OPERTIVE INSTILLATION OF INTRAVESICAL GEMCITABINE ;  Surgeon: Renda Glance, MD;  Location: [facility];  Service: Urology;  Laterality: Right;   TRANSURETHRAL RESECTION OF BLADDER TUMOR N/A 11/30/2022   Procedure: TRANSURETHRAL RESECTION OF BLADDER TUMOR (TURBT);  Surgeon: Renda Glance, MD;  Location: [facility];  Service: Urology;  Laterality: N/A;  60 MINUTES NEEDED FOR CASE   TRANSURETHRAL RESECTION OF BLADDER TUMOR N/A 01/01/2023   Procedure: TRANSURETHRAL RESECTION OF BLADDER TUMOR (TURBT);  Surgeon: Renda Glance, MD;  Location: [facility];  Service: Urology;  Laterality: N/A;  45 MINUTES NEEDED FOR CASE  ANESTHESIA IS GENERAL WITH PARALYSIS    Current Medications: Current Meds  Medication Sig   acetaminophen  (TYLENOL ) 650 MG CR tablet Take 1,300 mg by mouth every 8 (eight) hours as needed for pain.   amLODipine  (NORVASC ) 5 MG tablet Take 1 tablet (5 mg total) by mouth in the morning and at bedtime.   atorvastatin  (LIPITOR) 40 MG tablet TAKE 1 TABLET(40 MG) BY MOUTH DAILY   Blood Pressure Monitor MISC 1 each by Does not apply route daily.   carvedilol  (COREG ) 25 MG  tablet TAKE 1 TABLET(25 MG) BY MOUTH TWICE DAILY WITH A MEAL   losartan  (COZAAR ) 100 MG tablet Take 1 tablet (100 mg total) by mouth daily.   metFORMIN  (GLUCOPHAGE ) 1000 MG tablet TAKE 1 TABLET(1000 MG) BY MOUTH TWICE DAILY WITH A MEAL   phenazopyridine  (PYRIDIUM ) 200 MG tablet Take 1 tablet (200 mg total) by mouth 3 (three) times daily as needed for pain.   spironolactone  (ALDACTONE ) 25 MG tablet Take 1 tablet (25 mg total) by mouth daily.     Allergies:   Patient has no known allergies.   Social History   Socioeconomic History   Marital status: Widowed     Spouse name: Not on file   Number of children: 1   Years of education: 32   Highest education level: High school graduate  Occupational History   Not on file  Tobacco Use   Smoking status: Former    Current packs/day: 0.00    Average packs/day: 0.5 packs/day for 25.0 years (12.5 ttl pk-yrs)    Types: Cigarettes    Start date: 07/03/1964    Quit date: 12/18/1982    Years since quitting: 41.0   Smokeless tobacco: Never  Vaping Use   Vaping status: Never Used  Substance and Sexual Activity   Alcohol use: No    Alcohol/week: 0.0 standard drinks of alcohol   Drug use: No   Sexual activity: Yes    Birth control/protection: Post-menopausal  Other Topics Concern   Not on file  Social History Narrative   Patient lives alone in [place].    Patient has one daughter, she is very close with her.    Patient is a widow ~3 years.   Patient is still working as a Programmer, applications.    Patient enjoys reading and watching TV.   Social Drivers of Corporate investment banker Strain: Low Risk  (12/12/2022)   Overall Financial Resource Strain (CARDIA)    Difficulty of Paying Living Expenses: Not hard at all  Food Insecurity: No Food Insecurity (12/12/2022)   Hunger Vital Sign    Worried About Running Out of Food in the Last Year: Never true    Ran Out of Food in the Last Year: Never true  Transportation Needs: No Transportation Needs (12/12/2022)   PRAPARE - Administrator, Civil Service (Medical): No    Lack of Transportation (Non-Medical): No  Physical Activity: Inactive (12/12/2022)   Exercise Vital Sign    Days of Exercise per Week: 0 days    Minutes of Exercise per Session: 0 min  Stress: No Stress Concern Present (12/12/2022)   [employer] of Occupational Health - Occupational Stress Questionnaire    Feeling of Stress : Not at all  Social Connections: Moderately Isolated (12/12/2022)   Social Connection and Isolation Panel    Frequency of Communication with Friends and  Family: More than three times a week    Frequency of Social Gatherings with Friends and Family: Once a week    Attends Religious Services: More than 4 times per year    Active Member of [employer] or Organizations: No    Attends Banker Meetings: Never    Marital Status: Widowed     Family History: The patient's family history includes COPD in her mother; Diabetes in her daughter and mother; Heart disease in her sister; Hypertension in her brother, father, and mother.  ROS:   Please see the history of present illness.     All other systems  reviewed and are negative.  EKGs/Labs/Other Studies Reviewed:    The following studies were reviewed today: Echo 11/13/23: IMPRESSIONS     1. Left ventricular ejection fraction, by estimation, is 60 to 65%. Left  ventricular ejection fraction by 3D volume is 60 %. The left ventricle has  normal function. The left ventricle has no regional wall motion  abnormalities. There is mild asymmetric  left ventricular hypertrophy of the basal-septal segment. Left ventricular  diastolic parameters are consistent with Grade III diastolic dysfunction  (restrictive). Elevated left atrial pressure. The average left ventricular  global longitudinal strain is  -26.6 %. The global longitudinal strain is normal.   2. Right ventricular systolic function is normal. The right ventricular  size is normal. There is moderately elevated pulmonary artery systolic  pressure. The estimated right ventricular systolic pressure is 55.1 mmHg.   3. 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  Moderate mitral valve regurgitation. Mild mitral stenosis. The mean mitral  valve gradient is 8.0 mmHg at a heart rate of 66 MVA 1.54 cm2.      Moderate to severe mitral annular calcification.   4. Tricuspid valve regurgitation is mild to moderate.   5. The aortic valve is normal in structure. Aortic valve regurgitation is  not visualized. No aortic  stenosis is present.   6. Systolic blunting of the pulmonary veins, no reversal. The inferior  vena cava is normal in size with <50% respiratory variability, suggesting  right atrial pressure of 8 mmHg.        Recent Labs: 10/15/2023: BUN 33; Creatinine, Ser 2.16; Hemoglobin 10.0; Platelets 197; Potassium 4.8; Sodium 141  Recent Lipid Panel    Component Value Date/Time   CHOL 117 02/02/2020 1617   TRIG 64 02/02/2020 1617   HDL 54 02/02/2020 1617   CHOLHDL 2.2 02/02/2020 1617   CHOLHDL 2.5 03/17/2016 1405   VLDL 23 03/17/2016 1405   LDLCALC 49 02/02/2020 1617   LDLDIRECT 106 03/10/2015 1557     Risk Assessment/Calculations:      HYPERTENSION CONTROL Vitals:   01/03/24 1051 01/03/24 1117  BP: (!) 173/83 (!) 150/60    The patient's blood pressure is elevated above target today.  In order to address the patient's elevated BP: Blood pressure will be monitored at home to determine if medication changes need to be made.            Physical Exam:    VS:  BP (!) 150/60   Pulse (!) 58   Ht 5' (1.524 m)   Wt 156 lb (70.8 kg)   SpO2 99%   BMI 30.47 kg/m     Wt Readings from Last 3 Encounters:  01/03/24 156 lb (70.8 kg)  10/15/23 169 lb 12.8 oz (77 kg)  12/29/22 160 lb (72.6 kg)     GEN:  Well nourished, well developed in no acute distress HEENT: Normal NECK: No JVD; No carotid bruits LYMPHATICS: No lymphadenopathy CARDIAC: RRR, no murmurs, rubs, gallops RESPIRATORY:  Clear to auscultation without rales, wheezing or rhonchi  ABDOMEN: Soft, non-tender, non-distended MUSCULOSKELETAL:  No edema; No deformity  SKIN: Warm and dry NEUROLOGIC:  Alert and oriented x 3 PSYCHIATRIC:  Normal affect   ASSESSMENT:    No diagnosis found. PLAN:    In order of problems listed above:  Moderate MR. Exam is fairly unremarkable. Echo is stable.  HTN - BP elevated today but has been OK at PCP and at home. On multiple meds including losartan , aldactone ,  Coreg  and amlodipine .       Follow up in one year      Medication Adjustments/Labs and Tests Ordered: Current medicines are reviewed at length with the patient today.  Concerns regarding medicines are outlined above.  No orders of the defined types were placed in this encounter.  No orders of the defined types were placed in this encounter.   There are no Patient Instructions on file for this visit.   Signed, Jona Erkkila [place], MD  01/03/2024 11:18 AM    [facility]

## 2024-01-03 ENCOUNTER — Ambulatory Visit: Attending: Cardiology | Admitting: Cardiology

## 2024-01-03 VITALS — BP 150/60 | HR 58 | Ht 60.0 in | Wt 156.0 lb

## 2024-01-03 DIAGNOSIS — I340 Nonrheumatic mitral (valve) insufficiency: Secondary | ICD-10-CM

## 2024-01-03 DIAGNOSIS — I10 Essential (primary) hypertension: Secondary | ICD-10-CM

## 2024-01-03 NOTE — Patient Instructions (Signed)

## 2024-01-08 ENCOUNTER — Other Ambulatory Visit: Payer: Self-pay | Admitting: Physician Assistant

## 2024-01-09 ENCOUNTER — Other Ambulatory Visit: Payer: Self-pay

## 2024-01-09 DIAGNOSIS — I059 Rheumatic mitral valve disease, unspecified: Secondary | ICD-10-CM

## 2024-01-09 DIAGNOSIS — I10 Essential (primary) hypertension: Secondary | ICD-10-CM

## 2024-01-09 DIAGNOSIS — E1159 Type 2 diabetes mellitus with other circulatory complications: Secondary | ICD-10-CM

## 2024-01-09 DIAGNOSIS — I340 Nonrheumatic mitral (valve) insufficiency: Secondary | ICD-10-CM

## 2024-01-09 DIAGNOSIS — I272 Pulmonary hypertension, unspecified: Secondary | ICD-10-CM

## 2024-01-09 MED ORDER — SPIRONOLACTONE 25 MG PO TABS
25.0000 mg | ORAL_TABLET | Freq: Every day | ORAL | 3 refills | Status: AC
Start: 2024-01-09 — End: ?

## 2024-01-31 ENCOUNTER — Other Ambulatory Visit: Payer: Self-pay | Admitting: Physician Assistant

## 2024-02-04 ENCOUNTER — Other Ambulatory Visit: Payer: Self-pay

## 2024-02-04 MED ORDER — LOSARTAN POTASSIUM 100 MG PO TABS: 100.0000 mg | ORAL_TABLET | Freq: Every day | ORAL | 3 refills | Status: DC

## 2024-04-04 ENCOUNTER — Other Ambulatory Visit: Payer: Self-pay

## 2024-04-04 DIAGNOSIS — E11618 Type 2 diabetes mellitus with other diabetic arthropathy: Secondary | ICD-10-CM

## 2024-04-04 MED ORDER — METFORMIN HCL 1000 MG PO TABS
ORAL_TABLET | ORAL | 11 refills | Status: AC
Start: 2024-04-04 — End: ?

## 2024-04-10 ENCOUNTER — Ambulatory Visit: Admitting: Podiatry

## 2024-04-10 ENCOUNTER — Encounter: Payer: Self-pay | Admitting: Podiatry

## 2024-04-10 DIAGNOSIS — M25572 Pain in left ankle and joints of left foot: Secondary | ICD-10-CM | POA: Diagnosis not present

## 2024-04-10 DIAGNOSIS — M79672 Pain in left foot: Secondary | ICD-10-CM

## 2024-04-10 DIAGNOSIS — M25571 Pain in right ankle and joints of right foot: Secondary | ICD-10-CM

## 2024-04-10 DIAGNOSIS — M79671 Pain in right foot: Secondary | ICD-10-CM | POA: Diagnosis not present

## 2024-04-10 DIAGNOSIS — B351 Tinea unguium: Secondary | ICD-10-CM

## 2024-04-10 NOTE — Progress Notes (Signed)
 Patient presents for evaluation and treatment of tenderness and some redness around nails feet.  Tenderness around toes with walking and wearing shoes.  Sometimes complains of pain also over the dorsal aspect of the foot.  She notices the lump on top of each foot.  Worse on the left than on the right.  Does not recall any injury to it.  No complaint of any paresthesias or burning in the feet bilaterally.  Type 2 diabetes.  Physical exam:  General appearance: Alert, pleasant, and in no acute distress.  Vascular: Pedal pulses: DP 2/4 B/L, PT 1/4 B/L.  Moderate edema lower legs bilaterally.  Capillary refill time immediate bilaterally  Neurologic: Light touch intact feet bilaterally.  Normal Achilles tendon reflex bilaterally slight decrease in vibratory sensation in feet bilaterally.   Dermatologic:  Nails thickened, disfigured, discolored 1-5 BL with subungual debris.  Redness and hypertrophic nail folds along nail folds bilaterally but no signs of drainage or infection.  Musculoskeletal:  Tenderness at the second tarsal metatarsal joint bilaterally.  Osteophytic changes palpable dorsally.  Slight soreness with range of motion of the TMT 2 joints right.  Normal muscle strength lower extremity bilaterally   Diagnosis: 1. Painful onychomycotic nails 1 through 5 bilaterally. 2. Pain toes 1 through 5 bilaterally. 3.  Arthralgia tarsometatarsal joint 2 bilaterally.  Plan: -New patient office visit for evaluation and management.  Level 3.  Modifier 25. Discussed with the pain in the midfoot from the arthritis in the joints.  She has some osteophytic changes present.  Do not giving her that much trouble right now she says it is very intermittent.  So the right good stable and supportive shoes.  Avoid flat soled shoes or shoes without any support even around the house. -Debrided onychomycotic nails 1 through 5 bilaterally.  Sharply debrided nails with nail clipper and reduced with a power  bur.  Return 3 months [facility]

## 2024-06-12 ENCOUNTER — Other Ambulatory Visit: Payer: Self-pay | Admitting: Family Medicine

## 2024-06-12 DIAGNOSIS — E785 Hyperlipidemia, unspecified: Secondary | ICD-10-CM

## 2024-07-09 ENCOUNTER — Emergency Department (HOSPITAL_COMMUNITY)
Admission: EM | Admit: 2024-07-09 | Discharge: 2024-07-09 | Disposition: A | Discharge status: Home / Self Care | Attending: Emergency Medicine | Admitting: Emergency Medicine

## 2024-07-09 ENCOUNTER — Emergency Department (HOSPITAL_COMMUNITY)

## 2024-07-09 ENCOUNTER — Other Ambulatory Visit: Payer: Self-pay

## 2024-07-09 ENCOUNTER — Encounter (HOSPITAL_COMMUNITY): Payer: Self-pay

## 2024-07-09 DIAGNOSIS — R600 Localized edema: Secondary | ICD-10-CM | POA: Insufficient documentation

## 2024-07-09 DIAGNOSIS — Z7984 Long term (current) use of oral hypoglycemic drugs: Secondary | ICD-10-CM | POA: Diagnosis not present

## 2024-07-09 DIAGNOSIS — R030 Elevated blood-pressure reading, without diagnosis of hypertension: Secondary | ICD-10-CM

## 2024-07-09 DIAGNOSIS — E1122 Type 2 diabetes mellitus with diabetic chronic kidney disease: Secondary | ICD-10-CM | POA: Diagnosis not present

## 2024-07-09 DIAGNOSIS — N189 Chronic kidney disease, unspecified: Secondary | ICD-10-CM | POA: Insufficient documentation

## 2024-07-09 DIAGNOSIS — R7989 Other specified abnormal findings of blood chemistry: Secondary | ICD-10-CM | POA: Insufficient documentation

## 2024-07-09 DIAGNOSIS — I129 Hypertensive chronic kidney disease with stage 1 through stage 4 chronic kidney disease, or unspecified chronic kidney disease: Secondary | ICD-10-CM | POA: Insufficient documentation

## 2024-07-09 DIAGNOSIS — Z79899 Other long term (current) drug therapy: Secondary | ICD-10-CM | POA: Insufficient documentation

## 2024-07-09 DIAGNOSIS — R062 Wheezing: Secondary | ICD-10-CM

## 2024-07-09 DIAGNOSIS — U071 COVID-19: Secondary | ICD-10-CM | POA: Diagnosis not present

## 2024-07-09 DIAGNOSIS — Z853 Personal history of malignant neoplasm of breast: Secondary | ICD-10-CM | POA: Diagnosis not present

## 2024-07-09 DIAGNOSIS — R059 Cough, unspecified: Secondary | ICD-10-CM | POA: Diagnosis present

## 2024-07-09 LAB — CBC
HCT: 36.1 % (ref 36.0–46.0)
Hemoglobin: 11.7 g/dL — ABNORMAL LOW (ref 12.0–15.0)
MCH: 29.8 pg (ref 26.0–34.0)
MCHC: 32.4 g/dL (ref 30.0–36.0)
MCV: 92.1 fL (ref 80.0–100.0)
Platelets: 204 K/uL (ref 150–400)
RBC: 3.92 MIL/uL (ref 3.87–5.11)
RDW: 13.3 % (ref 11.5–15.5)
WBC: 7 K/uL (ref 4.0–10.5)
nRBC: 0 % (ref 0.0–0.2)

## 2024-07-09 LAB — BASIC METABOLIC PANEL WITH GFR
Anion gap: 10 (ref 5–15)
BUN: 20 mg/dL (ref 8–23)
CO2: 24 mmol/L (ref 22–32)
Calcium: 8.8 mg/dL — ABNORMAL LOW (ref 8.9–10.3)
Chloride: 105 mmol/L (ref 98–111)
Creatinine, Ser: 1.18 mg/dL — ABNORMAL HIGH (ref 0.44–1.00)
GFR, Estimated: 48 mL/min — ABNORMAL LOW
Glucose, Bld: 142 mg/dL — ABNORMAL HIGH (ref 70–99)
Potassium: 4.2 mmol/L (ref 3.5–5.1)
Sodium: 138 mmol/L (ref 135–145)

## 2024-07-09 LAB — PRO BRAIN NATRIURETIC PEPTIDE: Pro Brain Natriuretic Peptide: 372 pg/mL — ABNORMAL HIGH

## 2024-07-09 LAB — RESP PANEL BY RT-PCR (RSV, FLU A&B, COVID)  RVPGX2
Influenza A by PCR: NEGATIVE
Influenza B by PCR: NEGATIVE
Resp Syncytial Virus by PCR: NEGATIVE
SARS Coronavirus 2 by RT PCR: POSITIVE — AB

## 2024-07-09 LAB — TROPONIN T, HIGH SENSITIVITY
Troponin T High Sensitivity: 22 ng/L — ABNORMAL HIGH (ref 0–19)
Troponin T High Sensitivity: 24 ng/L — ABNORMAL HIGH (ref 0–19)

## 2024-07-09 MED ORDER — ALBUTEROL SULFATE (2.5 MG/3ML) 0.083% IN NEBU
5.0000 mg | INHALATION_SOLUTION | Freq: Once | RESPIRATORY_TRACT | Status: AC
  Administered 2024-07-09: 5 mg via RESPIRATORY_TRACT
  Filled 2024-07-09: qty 6

## 2024-07-09 MED ORDER — PREDNISONE 20 MG PO TABS
60.0000 mg | ORAL_TABLET | Freq: Once | ORAL | Status: AC
  Administered 2024-07-09: 60 mg via ORAL
  Filled 2024-07-09: qty 3

## 2024-07-09 MED ORDER — BENZONATATE 100 MG PO CAPS: 100.0000 mg | ORAL_CAPSULE | Freq: Three times a day (TID) | ORAL | 0 refills | Status: AC

## 2024-07-09 MED ORDER — PREDNISONE 20 MG PO TABS: ORAL_TABLET | ORAL | 0 refills | Status: AC

## 2024-07-09 MED ORDER — LOSARTAN POTASSIUM 50 MG PO TABS
100.0000 mg | ORAL_TABLET | Freq: Once | ORAL | Status: AC
  Administered 2024-07-09: 100 mg via ORAL
  Filled 2024-07-09: qty 2

## 2024-07-09 MED ORDER — ALBUTEROL SULFATE HFA 108 (90 BASE) MCG/ACT IN AERS: 1.0000 | INHALATION_SPRAY | RESPIRATORY_TRACT | Status: DC | PRN

## 2024-07-09 NOTE — Discharge Instructions (Addendum)
Make an appointment to have close follow-up with your primary care doctor.  Return to the emergency room if you have any worsening symptoms. 

## 2024-07-09 NOTE — ED Triage Notes (Signed)
 Pt came in via POV d/t flu-like s/s over three last 3 months intermittently. States since yesterday her cough/wheeze, CP/SOB has gotten worse. A/Ox4, rates her pain 8/10 during triage.

## 2024-07-09 NOTE — ED Provider Notes (Signed)
 " [place] [facility] AT [facility] Provider Note   CSN: [id number] Arrival date & time: 07/09/24  9281     Patient presents with: Shortness of Breath and Flu-like s/s   Ashley Sloan is a 74 y.o. female.   Patient is a 74 year old female with a history of hypertension, prior bladder tumor who presents with cough and congestion.  She said her symptoms started about 5 days ago.  She has had some intermittent colds for the last few months but most recently it started about 4 to 5 days ago.  She has a cough and has had some associated shortness of breath and wheezing.  She says she is having some pain in her chest and back associated with the coughing.  She denies any current chest pain but does have soreness with coughing.  She denies any fevers.  No vomiting.  No prior history of underlying lung disease.  She has had some leg swelling which she has had in the past.  She did not take her blood pressure medication today.       Prior to Admission medications  Medication Sig Start Date End Date Taking? Authorizing Provider  benzonatate  (TESSALON ) 100 MG capsule Take 1 capsule (100 mg total) by mouth every 8 (eight) hours. 07/09/24  Yes Lenor Hollering, MD  predniSONE  (DELTASONE ) 20 MG tablet 1 tabs po daily x 4 days 07/09/24  Yes Lenor Hollering, MD  acetaminophen  (TYLENOL ) 650 MG CR tablet Take 1,300 mg by mouth every 8 (eight) hours as needed for pain.    [provider]  amLODipine  (NORVASC ) 5 MG tablet Take 1 tablet (5 mg total) by mouth in the morning and at bedtime. 11/21/22   Hobart Powell BRAVO, MD  atorvastatin  (LIPITOR) 40 MG tablet TAKE 1 TABLET(40 MG) BY MOUTH DAILY 06/13/24   Everhart, Kirstie, DO  Blood Pressure Monitor MISC 1 each by Does not apply route daily. 05/15/22   Sharps Victory ORN, MD  carvedilol  (COREG ) 25 MG tablet TAKE 1 TABLET(25 MG) BY MOUTH TWICE DAILY WITH A MEAL 09/06/23   Everhart, Kirstie, DO  losartan  (COZAAR ) 100 MG tablet Take 1 tablet  (100 mg total) by mouth daily. 02/04/24   Conte, Tessa N, PA-C  metFORMIN  (GLUCOPHAGE ) 1000 MG tablet TAKE 1 TABLET(1000 MG) BY MOUTH TWICE DAILY WITH A MEAL 04/04/24   Everhart, Kirstie, DO  phenazopyridine  (PYRIDIUM ) 200 MG tablet Take 1 tablet (200 mg total) by mouth 3 (three) times daily as needed for pain. 11/30/22   Renda Glance, MD  spironolactone  (ALDACTONE ) 25 MG tablet Take 1 tablet (25 mg total) by mouth daily. 01/09/24   Jordan, Peter M, MD    Allergies: Patient has no known allergies.    Review of Systems  Constitutional:  Positive for fatigue. Negative for chills, diaphoresis and fever.  HENT:  Negative for congestion, rhinorrhea and sneezing.   Eyes: Negative.   Respiratory:  Positive for cough, shortness of breath and wheezing. Negative for chest tightness.   Cardiovascular:  Positive for chest pain. Negative for leg swelling.  Gastrointestinal:  Negative for abdominal pain, diarrhea, nausea and vomiting.  Genitourinary:  Negative for difficulty urinating.  Musculoskeletal:  Positive for back pain. Negative for arthralgias.  Skin:  Negative for rash.  Neurological:  Negative for dizziness, speech difficulty, weakness, numbness and headaches.    Updated Vital Signs BP (!) 190/79   Pulse 73   Temp 98.9 F (37.2 C) (Oral)   Resp 17   Ht  4' 11 (1.499 m)   Wt 75.8 kg   SpO2 100%   BMI 33.73 kg/m   Physical Exam Constitutional:      Appearance: She is well-developed.  HENT:     Head: Normocephalic and atraumatic.  Eyes:     Pupils: Pupils are equal, round, and reactive to light.  Cardiovascular:     Rate and Rhythm: Normal rate and regular rhythm.     Heart sounds: Normal heart sounds.  Pulmonary:     Effort: Pulmonary effort is normal. Tachypnea present. No respiratory distress.     Breath sounds: Wheezing and rhonchi present. No rales.  Chest:     Chest wall: No tenderness.  Abdominal:     General: Bowel sounds are normal.     Palpations: Abdomen is soft.      Tenderness: There is no abdominal tenderness. There is no guarding or rebound.  Musculoskeletal:        General: Normal range of motion.     Cervical back: Normal range of motion and neck supple.     Comments: Trace edema to lower extremities bilaterally  Lymphadenopathy:     Cervical: No cervical adenopathy.  Skin:    General: Skin is warm and dry.     Findings: No rash.  Neurological:     Mental Status: She is alert and oriented to person, place, and time.     (all labs ordered are listed, but only abnormal results are displayed) Labs Reviewed  RESP PANEL BY RT-PCR (RSV, FLU A&B, COVID)  RVPGX2 - Abnormal; Notable for the following components:      Result Value   SARS Coronavirus 2 by RT PCR POSITIVE (*)    All other components within normal limits  BASIC METABOLIC PANEL WITH GFR - Abnormal; Notable for the following components:   Glucose, Bld 142 (*)    Creatinine, Ser 1.18 (*)    Calcium  8.8 (*)    GFR, Estimated 48 (*)    All other components within normal limits  CBC - Abnormal; Notable for the following components:   Hemoglobin 11.7 (*)    All other components within normal limits  PRO BRAIN NATRIURETIC PEPTIDE - Abnormal; Notable for the following components:   Pro Brain Natriuretic Peptide 372.0 (*)    All other components within normal limits  TROPONIN T, HIGH SENSITIVITY - Abnormal; Notable for the following components:   Troponin T High Sensitivity 22 (*)    All other components within normal limits  TROPONIN T, HIGH SENSITIVITY - Abnormal; Notable for the following components:   Troponin T High Sensitivity 24 (*)    All other components within normal limits    EKG: EKG Interpretation Date/Time:  Wednesday July 09 2024 08:13:17 EST Ventricular Rate:  62 PR Interval:  140 QRS Duration:  112 QT Interval:  430 QTC Calculation: 436 R Axis:   -22  Text Interpretation: Normal sinus rhythm Incomplete right bundle branch block Borderline ECG When compared  with ECG of 02-Jan-2022 07:21, PREVIOUS ECG IS PRESENT Confirmed by Yolande Charleston [phone]) on 07/09/2024 11:03:06 AM  Radiology: [facility] Chest 2 View Result Date: 07/09/2024 EXAM: 2 VIEW(S) XRAY OF THE CHEST 07/09/2024 08:29:00 AM COMPARISON: None available. CLINICAL HISTORY: SOB FINDINGS: LUNGS AND PLEURA: Bilateral perihilar interstitial opacities throughout both lungs. No pleural effusion. No pneumothorax. HEART AND MEDIASTINUM: Cardiac silhouette at upper limits of normal size. Aortic atherosclerosis. BONES AND SOFT TISSUES: Thoracic degenerative changes. Surgical clips in left axilla. IMPRESSION: 1. Mild bilateral perihilar  interstitial opacities, which may reflect changes of an atypical viral infection or mild interstitial edema. Electronically signed by: Rogelia Myers MD 07/09/2024 08:34 AM EST RP Workstation: [id number]     Procedures   Medications Ordered in the ED  albuterol  (VENTOLIN  HFA) 108 (90 Base) MCG/ACT inhaler 1-2 puff (has no administration in time range)  albuterol  (PROVENTIL ) (2.5 MG/3ML) 0.083% nebulizer solution 5 mg (5 mg Nebulization Given 07/09/24 1622)  predniSONE  (DELTASONE ) tablet 60 mg (60 mg Oral Given 07/09/24 1621)  losartan  (COZAAR ) tablet 100 mg (100 mg Oral Given 07/09/24 1621)  albuterol  (PROVENTIL ) (2.5 MG/3ML) 0.083% nebulizer solution 5 mg (5 mg Nebulization Given 07/09/24 1935)                                    Medical Decision Making Amount and/or Complexity of Data Reviewed Labs: ordered. Radiology: ordered.  Risk Prescription drug management.   This patient presents to the ED for concern of cough, wheezing, this involves an extensive number of treatment options, and is a complaint that carries with it a high risk of complications and morbidity.  I considered the following differential and admission for this acute, potentially life threatening condition.  The differential diagnosis includes pneumonia, asthma exacerbation, pulmonary edema, PE, viral  infection, ACS, aortic dissection  MDM:    Patient is 74 year old who presents with cough in association with shortness of breath and wheezing.  She has associated soreness to her chest and back which she says is worse with coughing.  Her EKG does not show any ischemic changes.  Her troponins are minimally elevated but flat.  Chest x-ray shows some peribronchial thickening consistent with probable viral infection.  No discrete infiltrate.  COVID test is positive.  She has wheezing on exam and some tachypnea.  No signs of fluid overload.  Suspect she has bronchitis reaction to the COVID.  She was given 2 nebulizer treatments and is feeling much better.  Her lungs are clear with minimal wheezing.  She is able to ambulate without significant shortness of breath.  No hypoxia on ambulation.  Feel that she is appropriate for discharge.  She does not have other symptoms that would be more concerning for PE.  She was dispensed an albuterol  inhaler to use 2 puffs every 4-6 hours.  She was started on a prednisone  burst.  She was given a prescription for Tessalon  Per[place].  She was encouraged to have close follow-up with her PCP.  Return precautions were given.  Her blood pressure was elevated.  She was given a dose of her home blood pressure medication and can have this rechecked by her PCP.  (Labs, imaging, consults)  Labs: I Ordered, and personally interpreted labs.  The pertinent results include: Positive COVID test, mildly elevated BNP, mildly elevated troponins  Imaging Studies ordered: I ordered imaging studies including chest x-ray I independently visualized and interpreted imaging. I agree with the radiologist interpretation  Additional history obtained from  .  External records from outside source obtained and reviewed including prior notes  Cardiac Monitoring: The patient was maintained on a cardiac monitor.  If on the cardiac monitor, I personally viewed and interpreted the cardiac monitored which  showed an underlying rhythm of: Sinus rhythm  Reevaluation: After the interventions noted above, I reevaluated the patient and found that they have :improved  Social Determinants of Health:    Disposition: Discharged to home  Co morbidities that complicate  the patient evaluation  Past Medical History:  Diagnosis Date   Arthritis    hands   Cancer (HCC) 1991   Breast-left   Chronic kidney disease    Diabetes mellitus    Hyperlipidemia    Hypertension    Left breast mass      Medicines Meds ordered this encounter  Medications   albuterol  (PROVENTIL ) (2.5 MG/3ML) 0.083% nebulizer solution 5 mg   predniSONE  (DELTASONE ) tablet 60 mg   losartan  (COZAAR ) tablet 100 mg   albuterol  (PROVENTIL ) (2.5 MG/3ML) 0.083% nebulizer solution 5 mg   albuterol  (VENTOLIN  HFA) 108 (90 Base) MCG/ACT inhaler 1-2 puff   predniSONE  (DELTASONE ) 20 MG tablet    Sig: 1 tabs po daily x 4 days    Dispense:  4 tablet    Refill:  0   benzonatate  (TESSALON ) 100 MG capsule    Sig: Take 1 capsule (100 mg total) by mouth every 8 (eight) hours.    Dispense:  21 capsule    Refill:  0    I have reviewed the patients home medicines and have made adjustments as needed  Problem List / ED Course: Problem List Items Addressed This Visit   None Visit Diagnoses       COVID-19    -  Primary     Wheezing         Elevated blood pressure reading                   Final diagnoses:  COVID-19  Wheezing  Elevated blood pressure reading    ED Discharge Orders          Ordered    predniSONE  (DELTASONE ) 20 MG tablet        07/09/24 2104    benzonatate  (TESSALON ) 100 MG capsule  Every 8 hours        07/09/24 2104               Lenor Hollering, MD 07/09/24 2108  "

## 2024-07-09 NOTE — ED Notes (Signed)
 Pt provided with discharge and follow up instructions, medications discussed, pt verbalized understanding. VSS, pt ambulatory out of ED w/ all paperwork and belongings in NAD.

## 2024-07-10 ENCOUNTER — Ambulatory Visit: Admitting: Podiatry

## 2024-07-10 ENCOUNTER — Ambulatory Visit

## 2024-07-10 ENCOUNTER — Other Ambulatory Visit: Payer: Self-pay | Admitting: Family Medicine

## 2024-07-10 VITALS — BP 125/87 | Ht 60.0 in | Wt 167.0 lb

## 2024-07-10 DIAGNOSIS — I059 Rheumatic mitral valve disease, unspecified: Secondary | ICD-10-CM

## 2024-07-10 DIAGNOSIS — I340 Nonrheumatic mitral (valve) insufficiency: Secondary | ICD-10-CM

## 2024-07-10 DIAGNOSIS — E785 Hyperlipidemia, unspecified: Secondary | ICD-10-CM

## 2024-07-10 DIAGNOSIS — Z0000 Encounter for general adult medical examination without abnormal findings: Secondary | ICD-10-CM | POA: Diagnosis not present

## 2024-07-10 DIAGNOSIS — E1159 Type 2 diabetes mellitus with other circulatory complications: Secondary | ICD-10-CM

## 2024-07-10 DIAGNOSIS — I272 Pulmonary hypertension, unspecified: Secondary | ICD-10-CM

## 2024-07-10 DIAGNOSIS — I10 Essential (primary) hypertension: Secondary | ICD-10-CM

## 2024-07-10 MED ORDER — CARVEDILOL 25 MG PO TABS: ORAL_TABLET | ORAL | 3 refills | Status: AC

## 2024-07-10 MED ORDER — AMLODIPINE BESYLATE 5 MG PO TABS: 5.0000 mg | ORAL_TABLET | Freq: Two times a day (BID) | ORAL | 2 refills | Status: AC

## 2024-07-10 MED ORDER — LOSARTAN POTASSIUM 100 MG PO TABS: 100.0000 mg | ORAL_TABLET | Freq: Every day | ORAL | 3 refills | Status: AC

## 2024-07-10 NOTE — Progress Notes (Signed)
 I have refilled pt's BP meds per request. I will route to front desk admin for assistance scheduling an appt.

## 2024-07-10 NOTE — Patient Instructions (Signed)
 Ashley Sloan,  Thank you for taking the time for your Medicare Wellness Visit. I appreciate your continued commitment to your health goals. Please review the care plan we discussed, and feel free to reach out if I can assist you further.  Please note that Annual Wellness Visits do not include a physical exam. Some assessments may be limited, especially if the visit was conducted virtually. If needed, we may recommend an in-person follow-up with your provider.  Ongoing Care Seeing your primary care provider every 3 to 6 months helps us  monitor your health and provide consistent, personalized care.   Referrals If a referral was made during today's visit and you haven't received any updates within two weeks, please contact the referred provider directly to check on the status.  Recommended Screenings:  Health Maintenance  Topic Date Due   Zoster (Shingles) Vaccine (1 of 2) Never done   Eye exam for diabetics  04/30/2021   Medicare Annual Wellness Visit  12/12/2023   Flu Shot  02/01/2024   COVID-19 Vaccine (5 - 2025-26 season) 03/03/2024   Hemoglobin A1C  04/15/2024   Yearly kidney health urinalysis for diabetes  10/14/2024   Complete foot exam   10/14/2024   Colon Cancer Screening  02/09/2025   Yearly kidney function blood test for diabetes  07/09/2025   DTaP/Tdap/Td vaccine (2 - Td or Tdap) 03/20/2028   Pneumococcal Vaccine for age over 13  Completed   Osteoporosis screening with Bone Density Scan  Completed   Hepatitis C Screening  Completed   Meningitis B Vaccine  Aged Out   Breast Cancer Screening  Discontinued       07/10/2024   11:15 AM  Advanced Directives  Does Patient Have a Medical Advance Directive? No  Would patient like information on creating a medical advance directive? No - Patient declined    Vision: Annual vision screenings are recommended for early detection of glaucoma, cataracts, and diabetic retinopathy. These exams can also reveal signs of chronic conditions  such as diabetes and high blood pressure.  Dental: Annual dental screenings help detect early signs of oral cancer, gum disease, and other conditions linked to overall health, including heart disease and diabetes.  Please see the attached documents for additional preventive care recommendations.

## 2024-07-10 NOTE — Progress Notes (Signed)
 "  Chief Complaint  Patient presents with   Medicare Wellness    SUBSEQUENT     Subjective:   Ashley Sloan is a 74 y.o. female who presents for a Medicare Annual Wellness Visit.  Visit info / Clinical Intake: Medicare Wellness Visit Type:: Subsequent Annual Wellness Visit Persons participating in visit and providing information:: patient Medicare Wellness Visit Mode:: Telephone If telephone:: video error Since this visit was completed virtually, some vitals may be partially provided or unavailable. Missing vitals are due to the limitations of the virtual format.: Documented vitals are patient reported If Telephone or Video please confirm:: I connected with patient using audio/video enable telemedicine. I verified patient identity with two identifiers, discussed telehealth limitations, and patient agreed to proceed. Patient Location:: Home Provider Location:: Remote Interpreter Needed?: No Pre-visit prep was completed: yes AWV questionnaire completed by patient prior to visit?: no Living arrangements:: (!) lives alone Patient's Overall Health Status Rating: good Typical amount of pain: none Does pain affect daily life?: no Are you currently prescribed opioids?: no  Dietary Habits and Nutritional Risks How many meals a day?: (!) 1 (1 large meal) Eats fruit and vegetables daily?: yes Most meals are obtained by: preparing own meals In the last 2 weeks, have you had any of the following?: none Diabetic:: (!) yes Any non-healing wounds?: no How often do you check your BS?: as needed (labvwork) Would you like to be referred to a Nutritionist or for Diabetic Management? : no  Functional Status Activities of Daily Living (to include ambulation/medication): Independent Ambulation: Independent with device- listed below Home Assistive Devices/Equipment: Eyeglasses; Other (Comment) (hearing aids) Medication Administration: Independent Home Management (perform basic housework or  laundry): Independent Manage your own finances?: yes Primary transportation is: driving Concerns about vision?: no *vision screening is required for WTM* Concerns about hearing?: (!) yes Uses hearing aids?: (!) yes  Fall Screening Falls in the past year?: 0 Number of falls in past year: 0 Was there an injury with Fall?: 0 Fall Risk Category Calculator: 0 Patient Fall Risk Level: Low Fall Risk  Fall Risk Patient at Risk for Falls Due to: No Fall Risks Fall risk Follow up: Falls evaluation completed; Education provided  Home and Transportation Safety: All rugs have non-skid backing?: N/A, no rugs All stairs or steps have railings?: N/A, no stairs Grab bars in the bathtub or shower?: (!) no Have non-skid surface in bathtub or shower?: yes Good home lighting?: yes Regular seat belt use?: yes Hospital stays in the last year:: no  Cognitive Assessment Difficulty concentrating, remembering, or making decisions? : no Will 6CIT or Mini Cog be Completed: yes What year is it?: 0 points What month is it?: 0 points Give patient an address phrase to remember (5 components): [facility] [address] About what time is it?: 0 points Count backwards from 20 to 1: 0 points Say the months of the year in reverse: 0 points Repeat the address phrase from earlier: 0 points 6 CIT Score: 0 points  Advance Directives (For Healthcare) Does Patient Have a Medical Advance Directive?: No Would patient like information on creating a medical advance directive?: No - Patient declined (Daughter aware of wishes)  Reviewed/Updated  Reviewed/Updated: Reviewed All (Medical, Surgical, Family, Medications, Allergies, Care Teams, Patient Goals)    Allergies (verified) Patient has no known allergies.   Current Medications (verified) Outpatient Encounter Medications as of 07/10/2024  Medication Sig   acetaminophen  (TYLENOL ) 650 MG CR tablet Take 1,300 mg by mouth every  8 (eight) hours as needed for pain.    amLODipine  (NORVASC ) 5 MG tablet Take 1 tablet (5 mg total) by mouth in the morning and at bedtime.   atorvastatin  (LIPITOR) 40 MG tablet TAKE 1 TABLET(40 MG) BY MOUTH DAILY   benzonatate  (TESSALON ) 100 MG capsule Take 1 capsule (100 mg total) by mouth every 8 (eight) hours.   Blood Pressure Monitor MISC 1 each by Does not apply route daily.   carvedilol  (COREG ) 25 MG tablet TAKE 1 TABLET(25 MG) BY MOUTH TWICE DAILY WITH A MEAL   losartan  (COZAAR ) 100 MG tablet Take 1 tablet (100 mg total) by mouth daily.   metFORMIN  (GLUCOPHAGE ) 1000 MG tablet TAKE 1 TABLET(1000 MG) BY MOUTH TWICE DAILY WITH A MEAL   phenazopyridine  (PYRIDIUM ) 200 MG tablet Take 1 tablet (200 mg total) by mouth 3 (three) times daily as needed for pain.   predniSONE  (DELTASONE ) 20 MG tablet 1 tabs po daily x 4 days   spironolactone  (ALDACTONE ) 25 MG tablet Take 1 tablet (25 mg total) by mouth daily.   No facility-administered encounter medications on file as of 07/10/2024.    History: Past Medical History:  Diagnosis Date   Arthritis    hands   Cancer (HCC) 1991   Breast-left   Chronic kidney disease    Diabetes mellitus    Hyperlipidemia    Hypertension    Left breast mass    Past Surgical History:  Procedure Laterality Date   BREAST EXCISIONAL BIOPSY Left    benign   BREAST LUMPECTOMY Left 1991   BREAST SURGERY  1991   Lumpectomy   CESAREAN SECTION  1968   CYSTOSCOPY N/A 11/30/2022   Procedure: CYSTOSCOPY;  Surgeon: Renda Glance, MD;  Location: [facility];  Service: Urology;  Laterality: N/A;   CYSTOSCOPY N/A 01/01/2023   Procedure: CYSTOSCOPY;  Surgeon: Renda Glance, MD;  Location: [facility];  Service: Urology;  Laterality: N/A;   CYSTOSCOPY WITH URETEROSCOPY AND STENT PLACEMENT Right 10/11/2020   Procedure: CYSTOSCOPY WITH URETEROSCOPY AND STENT PLACEMENT/ RETROGRADE PYELOGRAM/ BIOPSY;  Surgeon: Renda Glance, MD;  Location: [facility];  Service: Urology;  Laterality: Right;   MASS EXCISION Left 11/19/2017    Procedure: EXCISION LEFT BREAST  MASS ERAS PATHWAY;  Surgeon: Mikell Katz, MD;  Location: [facility];  Service: General;  Laterality: Left;   ROBOT ASSITED LAPAROSCOPIC NEPHROURETERECTOMY Right 12/06/2020   Procedure: XI ROBOT ASSITED LAPAROSCOPIC NEPHROURETERECTOMY WITH POST OPERTIVE INSTILLATION OF INTRAVESICAL GEMCITABINE ;  Surgeon: Renda Glance, MD;  Location: [facility];  Service: Urology;  Laterality: Right;   TRANSURETHRAL RESECTION OF BLADDER TUMOR N/A 11/30/2022   Procedure: TRANSURETHRAL RESECTION OF BLADDER TUMOR (TURBT);  Surgeon: Renda Glance, MD;  Location: [facility];  Service: Urology;  Laterality: N/A;  60 MINUTES NEEDED FOR CASE   TRANSURETHRAL RESECTION OF BLADDER TUMOR N/A 01/01/2023   Procedure: TRANSURETHRAL RESECTION OF BLADDER TUMOR (TURBT);  Surgeon: Renda Glance, MD;  Location: [facility];  Service: Urology;  Laterality: N/A;  45 MINUTES NEEDED FOR CASE  ANESTHESIA IS GENERAL WITH PARALYSIS   Family History  Problem Relation Age of Onset   Diabetes Mother    Hypertension Mother    COPD Mother    Hypertension Father    Heart disease Sister    Hypertension Brother    Diabetes Daughter    Social History   Occupational History   Not on file  Tobacco Use   Smoking status: Former    Current packs/day: 0.00    Average  packs/day: 0.5 packs/day for 25.0 years (12.5 ttl pk-yrs)    Types: Cigarettes    Start date: 07/03/1964    Quit date: 12/18/1982    Years since quitting: 41.5   Smokeless tobacco: Never  Vaping Use   Vaping status: Never Used  Substance and Sexual Activity   Alcohol use: No    Alcohol/week: 0.0 standard drinks of alcohol   Drug use: No   Sexual activity: Yes    Birth control/protection: Post-menopausal   Tobacco Counseling Counseling given: Not Answered  SDOH Screenings   Food Insecurity: No Food Insecurity (07/10/2024)  Housing: Low Risk (07/10/2024)  Transportation Needs: No Transportation Needs (07/10/2024)  Utilities: Not At  Risk (07/10/2024)  Alcohol Screen: Low Risk (12/12/2022)  Depression (PHQ2-9): Low Risk (07/10/2024)  Financial Resource Strain: Low Risk (12/12/2022)  Physical Activity: Sufficiently Active (07/10/2024)  Social Connections: Moderately Isolated (07/10/2024)  Stress: No Stress Concern Present (07/10/2024)  Tobacco Use: Medium Risk (07/10/2024)  Health Literacy: Adequate Health Literacy (07/10/2024)   See flowsheets for full screening details  Depression Screen PHQ 2 & 9 Depression Scale- Over the past 2 weeks, how often have you been bothered by any of the following problems? Little interest or pleasure in doing things: 0 Feeling down, depressed, or hopeless (PHQ Adolescent also includes...irritable): 0 PHQ-2 Total Score: 0 Trouble falling or staying asleep, or sleeping too much: 0 Feeling tired or having little energy: 0 Poor appetite or overeating (PHQ Adolescent also includes...weight loss): 0 Feeling bad about yourself - or that you are a failure or have let yourself or your family down: 0 Trouble concentrating on things, such as reading the newspaper or watching television (PHQ Adolescent also includes...like school work): 0 Moving or speaking so slowly that other people could have noticed. Or the opposite - being so fidgety or restless that you have been moving around a lot more than usual: 0 Thoughts that you would be better off dead, or of hurting yourself in some way: 0 PHQ-9 Total Score: 0 If you checked off any problems, how difficult have these problems made it for you to do your work, take care of things at home, or get along with other people?: Not difficult at all  Depression Treatment Depression Interventions/Treatment : [id number] Score <4 Follow-up Not Indicated     Goals Addressed             This Visit's Progress    07/10/2024: My goal is to maintain my health by continuing my treatments for bladder cancer and to get healthier.               Objective:    Today's Vitals    07/10/24 1111 07/10/24 1133  BP: (!) 179/74 125/87  Weight: 167 lb (75.8 kg)   Height: 5' (1.524 m)   PainSc: 0-No pain    Body mass index is 32.61 kg/m.  Hearing/Vision screen No results found. Immunizations and Health Maintenance Health Maintenance  Topic Date Due   Zoster Vaccines- Shingrix (1 of 2) Never done   OPHTHALMOLOGY EXAM  04/30/2021   Influenza Vaccine  02/01/2024   COVID-19 Vaccine (5 - 2025-26 season) 03/03/2024   HEMOGLOBIN A1C  04/15/2024   Diabetic kidney evaluation - Urine ACR  10/14/2024   FOOT EXAM  10/14/2024   Colonoscopy  02/09/2025   Diabetic kidney evaluation - eGFR measurement  07/09/2025   Medicare Annual Wellness (AWV)  07/10/2025   DTaP/Tdap/Td (2 - Td or Tdap) 03/20/2028   Pneumococcal Vaccine: 50+ Years  Completed   Bone Density Scan  Completed   Hepatitis C Screening  Completed   Meningococcal B Vaccine  Aged Out   Mammogram  Discontinued        Assessment/Plan:  This is a routine wellness examination for Ashley Sloan.  Patient Care Team: Stoney Blizzard, DO as PCP - General (Family Medicine) Claudene Victory ORN, MD (Inactive) as PCP - Cardiology (Cardiology) Christine Rush, DPM as Consulting Physician (Podiatry) Jordan, Peter M, MD as Consulting Physician (Cardiology) Renda Glance, MD as Consulting Physician (Urology) Clem Brands, PA. as Physician Assistant (Ophthalmology) [facility] as Consulting Physician (Ophthalmology)  I have personally reviewed and noted the following in the patients chart:   Medical and social history Use of alcohol, tobacco or illicit drugs  Current medications and supplements including opioid prescriptions. Functional ability and status Nutritional status Physical activity Advanced directives List of other physicians Hospitalizations, surgeries, and ER visits in previous 12 months Vitals Screenings to include cognitive, depression, and falls Referrals and appointments  No orders of  the defined types were placed in this encounter.  In addition, I have reviewed and discussed with patient certain preventive protocols, quality metrics, and best practice recommendations. A written personalized care plan for preventive services as well as general preventive health recommendations were provided to patient.   Roz LOISE Fuller, LPN   02/01/7972   Return in about 1 year (around 07/10/2025) for Medicare wellness.  After Visit Summary: (MyChart) Due to this being a telephonic visit, the after visit summary with patients personalized plan was offered to patient via MyChart   Nurse Notes:  HM Addressed: Vaccines Due: Flu, Covid-19, Shingrix Labs due: HgA1C.  Patient is overdue for diabetic eye exam with [facility].  "

## 2024-07-10 NOTE — Telephone Encounter (Signed)
 Patient is requesting a refill on Losartan  100mg  tablet to be sent to [facility]. [facility].  Patient was seen in ER 07/09/2024 and bp was 228/? (According to patient).  This morning at home, patient stated bp was 228/? She rechecked later and it was 125/87.  Patient has been without her medication for some time.  Allyssia Skluzacek N. Tomie, LPN [facility] Annual Wellness Team Direct Dial: [phone]

## 2024-07-11 NOTE — Telephone Encounter (Signed)
 Patient is scheduled for 01/26 @330 

## 2024-07-28 ENCOUNTER — Ambulatory Visit: Payer: Self-pay | Admitting: Family Medicine
